# Patient Record
Sex: Male | Born: 1940
Health system: Southern US, Community
[De-identification: ages and names within clinical notes are randomized; demographics above are authoritative.]

## PROBLEM LIST (undated history)

## (undated) DIAGNOSIS — I739 Peripheral vascular disease, unspecified: Secondary | ICD-10-CM

## (undated) DIAGNOSIS — J449 Chronic obstructive pulmonary disease, unspecified: Secondary | ICD-10-CM

## (undated) DIAGNOSIS — E7800 Pure hypercholesterolemia, unspecified: Secondary | ICD-10-CM

## (undated) DIAGNOSIS — R413 Other amnesia: Secondary | ICD-10-CM

## (undated) DIAGNOSIS — I10 Essential (primary) hypertension: Secondary | ICD-10-CM

## (undated) DIAGNOSIS — K219 Gastro-esophageal reflux disease without esophagitis: Secondary | ICD-10-CM

## (undated) HISTORY — DX: Pure hypercholesterolemia, unspecified: E78.00

## (undated) HISTORY — DX: Peripheral vascular disease, unspecified: I73.9

## (undated) HISTORY — PX: COLONOSCOPY: SHX174

## (undated) HISTORY — PX: HERNIA REPAIR: SHX51

## (undated) HISTORY — DX: Essential (primary) hypertension: I10

---
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
## 2000-09-23 ENCOUNTER — Encounter: Payer: Self-pay | Admitting: Internal Medicine

## 2000-09-23 ENCOUNTER — Emergency Department (HOSPITAL_COMMUNITY): Admission: EM | Admit: 2000-09-23 | Discharge: 2000-09-23 | Payer: Self-pay | Admitting: Internal Medicine

## 2004-09-23 ENCOUNTER — Emergency Department (HOSPITAL_COMMUNITY): Admission: EM | Admit: 2004-09-23 | Discharge: 2004-09-23 | Payer: Self-pay | Admitting: Emergency Medicine

## 2006-03-16 HISTORY — PX: COLONOSCOPY W/ BIOPSIES AND POLYPECTOMY: SHX1376

## 2008-08-31 ENCOUNTER — Encounter: Admission: RE | Admit: 2008-08-31 | Discharge: 2008-08-31 | Payer: Self-pay | Admitting: Interventional Cardiology

## 2008-10-11 ENCOUNTER — Ambulatory Visit (HOSPITAL_COMMUNITY): Admission: RE | Admit: 2008-10-11 | Discharge: 2008-10-11 | Payer: Self-pay | Admitting: Interventional Cardiology

## 2008-10-22 ENCOUNTER — Ambulatory Visit: Payer: Self-pay | Admitting: Surgery

## 2009-09-23 ENCOUNTER — Ambulatory Visit: Payer: Self-pay | Admitting: Surgery

## 2010-06-22 LAB — GLUCOSE, CAPILLARY
Glucose-Capillary: 200 mg/dL — ABNORMAL HIGH (ref 70–99)
Glucose-Capillary: 224 mg/dL — ABNORMAL HIGH (ref 70–99)

## 2010-07-29 NOTE — Assessment & Plan Note (Signed)
OFFICE VISIT   JARIN, CORNFIELD  DOB:  Jan 27, 1941                                       09/23/2009  GUYQI#:[id number]   REASON FOR VISIT:  Follow-up claudication.   HISTORY:  The patient is a 70 year old gentleman that I saw in August of  last year at the request of Dr. Corky Crafts for bilateral  claudication right leg greater than the left.  The patient has been  having problems for several years but it had gotten worse.  He used to  be able to walk several miles on a treadmill and now he is walking only  approximately 0.2 miles.  He has a known left iliac occlusion, right  iliac stenosis and right superficial femoral occlusion.  Over the past  year, he has not had a significant change in his symptoms.  He still  walks about the same distance.  He does not have any wounds or ulcers  that are nonhealing.  He does not have rest pain symptoms.  The patient  continues to smoke.  He has tried Chantix and patches without success.   The patient suffers from diabetes.  Most recent hemoglobin A1c was 6.9.  He also suffers from hypercholesterolemia.  His LDL is 99.   REVIEW OF SYSTEMS:  GENERAL:  Negative  for fevers, chills, weight gain  or weight loss.  VASCULAR:  Positive for pain in the legs when walking.  CARDIAC:  Positive for shortness of breath with exertion.  GI:  Negative.  NEUROLOGIC:  Negative.  PULMONARY:  Negative.  HEME:  Negative.  GU:  Negative.  ENT:  Negative.  MUSCULOSKELETAL:  Negative.  PSYCHIATRIC:  Negative.  SKIN:  Negative.   PHYSICAL EXAMINATION:  Heart rate 70, blood pressure 149/68, O2  saturation is 98%.  Temperature is 98.2.  General:  He is well-appearing  in no distress.  HEENT:  Within normal limits.  Lungs:  Clear  bilaterally.  No rales or rhonchi.  Cardiovascular:  Regular rate and  rhythm. No murmur.  No carotid bruits.  The left femoral pulse is not  palpable.  Right is palpable.  Pedal pulses are not  palpable.  Abdomen:  Soft, nontender.  Musculoskeletal:  Without major deformities.  Neurologic:  No focal weaknesses.  Skin:  Without rash.   ASSESSMENT/PLAN:  Bilateral claudication right greater than left.   1. We discussed that the preferred operation for his disease would be      an aortobifemoral bypass graft.  After our discussion  he did not      feel that his symptoms were limiting enough to want to undergo      operation at this time.  He has been taking 50 mg of Cilostazol.  I      have recommended that we increase this to 100 mg to see if he can      get a little improvement in his walking ability.  I will plan on      seeing him back in 1 year or sooner if his symptoms worsen.  We      also discussed that if we were to proceed with aortobifemoral      bypass graft, I feel this most likely would relieve his      claudication symptoms.  However, he does have a right superficial  femoral occlusion, which may need to be addressed down the road, if      his symptoms do not completely go away on the right.   I also discussed with the patient his need to quit smoking, certainly if  we consider major vascular surgery.   Plan on seeing the patient back in 1 year.     Jorge Ny, MD  Electronically Signed   VWB/MEDQ  D:  09/23/2009  T:  09/24/2009  Job:  [id number]   cc:   Corky Crafts, MD  Dr. Oletha Cruel

## 2010-07-29 NOTE — Assessment & Plan Note (Signed)
OFFICE VISIT   KAIMEN, PEINE  DOB:  Sep 08, 1940                                       10/22/2008  ZOXWR#:[id number]   REASON FOR VISIT:  Claudication.   HISTORY:  This is a 70 year old gentleman I am seeing at the request of  Dr. Everette Rank for evaluation of bilateral claudication right greater  than left.  The patient states that he has been having problems with his  legs for approximately 2-3 years, but has gotten significantly worse  over the past 3 months.  He used to be able to walk several miles on a  treadmill and now he is walking approximately 0.2 miles, he develops  pain in his calf in his right leg, initially, followed by his left leg,  this is becoming lifestyle-limiting for him.  He has tried Pletal  without significant results.  He has undergone an arteriogram by Dr.  Eldridge Dace as well as a CT angiogram with runoff, this is consistent with  left iliac occlusion and significant right iliac disease and right  superficial femoral occlusion.  The patient comes in today to discuss  surgical options.  The patient is diabetic and has been so since age 66.  He does smoke approximately a pack of cigarettes per day.   PAST MEDICAL HISTORY:  1. Diabetes.  2. Hypercholesterolemia.  3. Tobacco abuse.   PAST SURGICAL HISTORY:  Bilateral inguinal hernias.   FAMILY HISTORY:  Negative for cardiovascular disease in early age.   SOCIAL HISTORY:  He is widowed, he smokes a pack a day, does not drink  alcohol.   REVIEW OF SYSTEMS:  GENERAL:  Is negative for fevers, chills, weight  gain, weight loss.  CARDIAC:  Negative.  PULMONARY:  Negative.  GI:  Negative.  GU:  Negative.  VASCULAR:  Is as above.  NEURO:  Negative.  ORTHO:  Negative.  PSYCH:  Negative.  ENT:  Negative.  HEME:  Negative.   MEDICATIONS:  Please see medical records.   ALLERGIES:  Sulfa.   PHYSICAL EXAMINATION:  Vital Signs:  Blood pressure 167/67, pulse 83.  General:  He is  well-appearing, no acute distress.  HEENT:  Normocephalic, atraumatic.  Pupils equal.  Sclerae anicteric.  Neck:  Supple.  No JVD.  No carotid bruits.  Cardiovascular:  Regular in rate  and rhythm, no murmur.  Pulmonary:  Lungs are clear bilaterally.  Abdomen:  Soft, nontender.  No pulsatile mass.  No hepatosplenomegaly.  Extremities:  Warm and well-perfused.  Pedal pulses are not palpable.  There is no ulceration.  Skin:  Without rash.  Neuro:  Cranial nerves II  through XII are grossly intact.  Psych:  He is alert and oriented x3.   DIAGNOSTIC STUDIES:  The patient comes with a CT angiogram and aortogram  with bilateral runoff.  These were extensively reviewed with the  patient, totaling approximately 45 minutes.   ASSESSMENT/PLAN:  Bilateral claudication.   Plan:  I had an extensive conversation with the patient and his friend  discussing our surgical options.  I told him that I would recommend an  aortobifemoral bypass graft.  I told him this would not address his  right superficial femoral occlusion, but I think with the above  operation he would likely experience significant benefit and may not  require a femoral popliteal bypass on the right.  We discussed all the  risks and benefits of surgery, the pros and cons of an operation vs  continued surveillance with an exercise program, at this point the  patient is a little reluctant to proceed with an operation and wishes to  continue with an exercise program, we discussed the details of a formal  exercise program, he wishes to implement that to see if he can avoid an  operation.  Again, he does not have a limb-threatening situation at this  time and I think this is reasonable, he will contact me in the future  when he gets to the point where he requires surgical bypass, he  understands that his disease can progress in the meantime, and that if  we wait greater than 3-6 months we may need to repeat some of his  imaging.  Also, the  patient is scheduled to get a carotid duplex to  evaluate his extracranial carotid circulation within the next week.   Jorge Ny, MD  Electronically Signed   VWB/MEDQ  D:  10/22/2008  T:  10/23/2008  Job:  [id number]   cc:   Corky Crafts, MD  Bryan Lemma. Manus Gunning, M.D.

## 2010-07-29 NOTE — Op Note (Signed)
Steven Sanders, Steven Sanders              ACCOUNT NO.:  [id number]   MEDICAL RECORD NO.:  [id number]          PATIENT TYPE:  AMB   LOCATION:  SDS                          FACILITY:  [facility]   PHYSICIAN:  Corky Crafts, MDDATE OF BIRTH:  1940-07-19   DATE OF PROCEDURE:  10/11/2008  DATE OF DISCHARGE:  10/11/2008                               OPERATIVE REPORT   REFERRING PHYSICIAN:  Bryan Lemma. Ehinger, MD   PROCEDURES PERFORMED:  Abdominal aortogram, pelvic angiogram, bilateral  lower extremity runoff.   OPERATOR:  Corky Crafts, MD   INDICATIONS:  Claudication, right leg worse than left.   PROCEDURE NARRATIVE:  The risks and benefits of peripheral vascular  angiography were explained to the patient and informed consent was  obtained.  He was brought to the [facility] lab.  He was prepped and draped in  the usual sterile fashion.  His right groin was infiltrated with 1%  lidocaine.  A 5-French sheath was placed into the right femoral artery  using the modified Seldinger technique.  Pigtail catheter was advanced  to the abdominal aorta.  A power injection of contrast was performed in  the AP projection to image the abdominal aorta.  The catheter was pulled  back to the aortoiliac bifurcation.  A power injection of contrast was  performed to image the pelvic vasculature.  A power injection of  contrast was performed in a bolus-chase fashion to image the both lower  extremities.  The pigtail catheter was then used to take selective shots  of the external iliac artery.  Subsequently, a shot through the sheath  was obtained to take a picture of the distal external iliac and right  common femoral artery.  The sheath was removed using manual compression.   FINDINGS:  There was no abdominal aortic aneurysm.  There were bilateral  single renal arteries, both of which were widely patent.  The left leg  showed the left common iliac artery was occluded proximally.  The left  internal iliac artery  was occluded.  The left external iliac artery was  occluded until just above the pelvic brim where it reconstituted.  There  were collaterals to the internal iliac coming off of the aorta.  The  left common femoral artery appeared patent.  The left profunda femoral  artery was patent.  The left SFA was patent.  The left popliteal artery  was patent.  There was three-vessel runoff below the left knee, all of  which was patent.  The right leg:  Right common iliac artery was diffusely diseased with  mild-to-moderate atherosclerosis in calcium.  The external iliac had a  tight stenosis.  Distally, the sheath was nearly occlusive.  It appeared  that the severe disease at the distal external iliac extended into the  right common femoral artery, which was quite calcified.  The right  profunda femoral artery was widely patent.  The right SFA had diseased  proximally.  It was occluded in the proximal SFA and reconstituted at  the mid SFA.  There were collaterals supplying blood flow to the distal  SFA.  The  right popliteal artery appeared patent.  The right anterior  tibial artery appeared occluded at the mid vessel.  The right  tibioperoneal trunk was patent.  The peroneal artery was small, but  patent.  The right posterior tibial artery was patent.   IMPRESSION:  1. Severe atherosclerotic disease involving the left common iliac and      left external iliac artery.  There was severe disease also      involving the right distal external iliac and common femoral      artery.  The right superficial femoral artery is occluded.  The      right anterior tibial artery is occluded.  2. There was two-vessel runoff below the right knee.  There was three-      vessel runoff below the left main.   RECOMMENDATIONS:  We will consult Vascular Surgery for their opinion as  to whether a surgical revascularization may be some benefit.      Corky Crafts, MD  Electronically Signed     JSV/MEDQ  D:   10/11/2008  T:  10/11/2008  Job:  [id number]

## 2010-08-01 NOTE — Consult Note (Signed)
[facility]  Patient:    Steven Sanders, Steven Sanders                     MRN: [id number] Proc. Date: 09/23/00 Adm. Date:  [id number] Disc. Date: [id number] Attending:  Kelvin Cellar CC:         Dyanne Carrel, M.D. (New patient)   Consultation Report  DATE OF BIRTH:  Aug 02, 1940  REASON FOR CONSULTATION:  Confusion, mild metabolic acidosis, uncontrolled diabetic with no medical care.  HISTORY OF PRESENT ILLNESS:  Steven Sanders is a 70 year old type 1 diabetic, diagnosed at age 11 when he presented initially with a coma, managed by insulin alone in the subsequent years, with no active medical care for the last 20 years, self-dosing his insulin 70/30 at a fixed dose of 65 units daily.  He has had one and possibly two prior episodes of DKA, none since about 15 years ago.  Steven Sanders was well until this morning, when he awoke confused.  He was able somehow to make it to work (the details of which he does not recall), where co-workers noted him to be acting abnormally.  He was brought to the emergency room for evaluation, where he was noted to be hyperglycemic and with mild metabolic acidosis, bicarbonate 19 venous.  Insulin and IV fluids were administered, and mental status began rapidly improving.  I was asked to evaluate him for possible admission or disposition.  Steven Sanders denies prior symptoms of illness.  He has had no cough, upper respiratory symptoms, dysuria, diarrhea, change in appetite, weight loss or weight gain.  He missed his dose of insulin yesterday secondary to a business trip to [place]; he had subsequently had no insulin for approximately 48 hours prior to his presentation here today.  REVIEW OF SYSTEMS:  Review of systems otherwise significant for burning calf pain with walking, improved with rest; also developing some significant numbness of the distal feet.  His eyesight has not been formally evaluated, and he wears glasses to  read.  PAST MEDICAL HISTORY:  Past medical history surprisingly unremarkable aside from diabetes.  MEDICATIONS:  Insulin 70/30, 65 units ______  q.d. before lunch.  SOCIAL HISTORY:  Patient is single and has very close friends at work.  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  He smokes two packs per day.  FAMILY HISTORY:   Negative for diabetes and heart disease.  PHYSICAL EXAMINATION:  VITAL SIGNS:  On presentation, temperature 97, pulse 97, blood pressure 143/62, respiratory rate 18.  GENERAL:  He is a slender man with ruddy complexion.  By the time I evaluated him, his mental status was completely clear and lucid.  NECK:  The carotids are without bruits.  LUNGS:  The lungs are surprisingly clear.  HEART:  Regular rate and rhythm with no significant murmur, rub or gallop.  ABDOMEN:  Soft and nontender with no abdominal bruit.  He has increased abdominal wall tone.  EXTREMITIES:  Lower extremities are notable for significantly reduced pulses at the popliteal and dorsalis pedis, posterior tibial.  Capillary refill at the toes is 3 seconds.  The feet are warm.  He has reduced sensation primarily over the left foot but also of the right toes.  There is no lower extremity edema.  He has a normal distal hair pattern.  LABORATORY DATA:  Sodium 134, potassium 4.5, bicarb 19, creatinine 1.1.  Serum glucose 373.  Blood positive for ketones as is urinalysis.  Serum osmolality is normal.  WBC elevated at 15 with normal hemoglobin.  EKG:  Normal sinus rhythm, pulse 90, with poor R wave progression and presence of left atrial enlargement.  ASSESSMENT AND PLAN:  Steven Sanders has early diabetic ketoacidosis secondary to insulin insufficiency.  We administered 2.5 L normal saline and rechecked BMET, with improvement in bicarbonate to 22, improvement in creatinine to 0.9 and increase in sodium to 138.  His mental status is normal, as confirmed by close friends present  today.  He is stable for discharge home.  We had a long talk about his reticence for physician involvement in his medical care.  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  He feels that he is developing nocturnal hypoglycemia based on symptoms.  He eats only two meals a day, at lunch and supper, and administers 70/30 before lunch without a bedtime snack.  We discussed the probability that his hemoglobin A1c may be very high, and in combination with heavy tobacco use, he is at markedly increased risk for cardiovascular disease, peripheral vascular disease, nephropathy and retinopathy.  He acknowledges this, and after reinforcement by friends, agrees to follow up with a physician and take a more active role in his medical care.  I have arranged a new patient evaluation with Dr. Delories Heinz Ehinger in five days, and have scheduled an appointment at [facility].  He will obtain a glucometer tomorrow and will start monitoring blood sugars q.a.c./h.s.  Initially, he will stay on a 70/30 but will add a bedtime snack to avoid nocturnal hypoglycemia, then we can establish a pattern and adjustments can be made.  I discussed with him the possibilities of other insulin types such as Lantus and Humalog.  He is interested in a once-daily injection if he could be regulated.  I fear he is also developing atherosclerotic peripheral vascular disease, based on his symptoms of claudication and his reduced pulses on physical exam. He will need evaluation for this, as well as routine ophthalmologic evaluation.  DD:  09/23/00 TD:  09/24/00 Job: [id number]

## 2010-09-22 ENCOUNTER — Encounter (INDEPENDENT_AMBULATORY_CARE_PROVIDER_SITE_OTHER): Payer: BC Managed Care – PPO

## 2010-09-22 DIAGNOSIS — I7092 Chronic total occlusion of artery of the extremities: Secondary | ICD-10-CM

## 2011-08-31 ENCOUNTER — Encounter: Payer: Self-pay | Admitting: Surgery

## 2011-09-28 ENCOUNTER — Ambulatory Visit: Payer: BC Managed Care – PPO | Admitting: Surgery

## 2011-10-02 ENCOUNTER — Encounter: Payer: Self-pay | Admitting: Neurosurgery

## 2011-10-05 ENCOUNTER — Ambulatory Visit: Payer: BC Managed Care – PPO | Admitting: Neurosurgery

## 2011-10-05 ENCOUNTER — Ambulatory Visit (INDEPENDENT_AMBULATORY_CARE_PROVIDER_SITE_OTHER): Payer: BC Managed Care – PPO | Admitting: Neurosurgery

## 2011-10-05 ENCOUNTER — Ambulatory Visit (INDEPENDENT_AMBULATORY_CARE_PROVIDER_SITE_OTHER): Payer: BC Managed Care – PPO | Admitting: *Deleted

## 2011-10-05 ENCOUNTER — Encounter: Payer: Self-pay | Admitting: Neurosurgery

## 2011-10-05 VITALS — BP 135/67 | HR 59 | Resp 16 | Ht 66.0 in | Wt 150.8 lb

## 2011-10-05 DIAGNOSIS — I70219 Atherosclerosis of native arteries of extremities with intermittent claudication, unspecified extremity: Secondary | ICD-10-CM

## 2011-10-05 NOTE — Progress Notes (Signed)
[facility] Office Note  CC: One-year followup with ABIs to claudication Referring Physician: Myra Gianotti  History of Present Illness: 71 year old male patient of Dr. Myra Gianotti followed for known bilateral claudication. Patient was last seen by Dr. Myra Gianotti July 2011 since that time his ABIs and remained stable but very low. The patient reports his lateral leg pain is unchanged but he has decided to proceed with surgery in early 2014. The patient denies rest pain or open ulcerations.  Past Medical History  Diagnosis Date  . Hypercholesterolemia   . Diabetes mellitus   . Peripheral vascular disease   . Hypertension     ROS: [x]  Positive   [ ]  Denies    General: [ ]  Weight loss, [ ]  Fever, [ ]  chills Neurologic: [ ]  Dizziness, [ ]  Blackouts, [ ]  Seizure [ ]  Stroke, [ ]  "Mini stroke", [ ]  Slurred speech, [ ]  Temporary blindness; [ ]  weakness in arms or legs, [ ]  Hoarseness Cardiac: [ ]  Chest pain/pressure, [ ]  Shortness of breath at rest [x ] Shortness of breath with exertion, [ ]  Atrial fibrillation or irregular heartbeat Vascular: [ x] Pain in legs with walking, [ ]  Pain in legs at rest, [ ]  Pain in legs at night,  [ ]  Non-healing ulcer, [ ]  Blood clot in vein/DVT,   Pulmonary: [ ]  Home oxygen, [ ]  Productive cough, [ ]  Coughing up blood, [ ]  Asthma,  [ ]  Wheezing Musculoskeletal:  [ ]  Arthritis, [ ]  Low back pain, [ ]  Joint pain Hematologic: [ ]  Easy Bruising, [ ]  Anemia; [ ]  Hepatitis Gastrointestinal: [ ]  Blood in stool, [ ]  Gastroesophageal Reflux/heartburn, [ ]  Trouble swallowing Urinary: [ ]  chronic Kidney disease, [ ]  on HD - [ ]  MWF or [ ]  TTHS, [ ]  Burning with urination, [ ]  Difficulty urinating Skin: [ ]  Rashes, [ ]  Wounds Psychological: [ ]  Anxiety, [ ]  Depression   Social History History  Substance Use Topics  . Smoking status: Former Smoker -- 1.0 packs/day    Types: Cigarettes    Quit date: 01/14/2009  . Smokeless tobacco: Not on  file  . Alcohol Use: Yes    Family History Family History  Problem Relation Age of Onset  . Hyperlipidemia Mother   . Hyperlipidemia Father     Allergies  Allergen Reactions  . Neomycin   . Tea Tree Oil   . Sulfa Antibiotics     Current Outpatient Prescriptions  Medication Sig Dispense Refill  . aspirin 81 MG tablet Take 81 mg by mouth daily.      . cilostazol (PLETAL) 50 MG tablet Take 50 mg by mouth.      . fish oil-omega-3 fatty acids 1000 MG capsule Take 2 g by mouth daily.      Marland Kitchen lisinopril (PRINIVIL,ZESTRIL) 5 MG tablet Take 5 mg by mouth daily.      Marland Kitchen NOVOLIN 70/30 (70-30) 100 UNIT/ML injection Inject 100 Units into the skin daily.      Marland Kitchen omeprazole (PRILOSEC) 20 MG capsule Take 20 mg by mouth every other day.      . rosuvastatin (CRESTOR) 40 MG tablet Take 40 mg by mouth daily.        Physical Examination  Filed Vitals:   10/05/11 1615  BP: 135/67  Pulse: 59  Resp: 16    Body mass index is 24.34 kg/(m^2).  General:  WDWN in NAD Gait: Normal HEENT: WNL Eyes: Pupils equal Pulmonary: normal non-labored breathing ,  without Rales, rhonchi,  wheezing Cardiac: RRR, without  Murmurs, rubs or gallops; No carotid bruits Abdomen: soft, NT, no masses Skin: no rashes, ulcers noted Vascular Exam/Pulses: Lower extremity pulses are not palpable  Extremities without ischemic changes, no Gangrene , no cellulitis; no open wounds;  Musculoskeletal: no muscle wasting or atrophy  Neurologic: A&O X 3; Appropriate Affect ; SENSATION: normal; MOTOR FUNCTION:  moving all extremities equally. Speech is fluent/normal  Non-Invasive Vascular Imaging: ABIs today are 0.34 on the right, 0.44 on the left and monophasic, previous ABIs from July 2012 were 0.37 on the right, 0.46 on the left  ASSESSMENT/PLAN: Patient with significant claudication with ambulation, he states he will proceed with speaking with Dr. Myra Gianotti about surgery but does not want this done until January or February of  2014. The patient will followup in December 2013 with another ABI and discuss surgery with Dr. Myra Gianotti. The patient's questions were encouraged and answered.  Lauree Chandler ANP  Clinic M.D.: Myra Gianotti

## 2011-11-19 ENCOUNTER — Emergency Department (HOSPITAL_COMMUNITY): Payer: BC Managed Care – PPO

## 2011-11-19 ENCOUNTER — Emergency Department (HOSPITAL_COMMUNITY)
Admission: EM | Admit: 2011-11-19 | Discharge: 2011-11-19 | Disposition: A | Discharge status: Home / Self Care | Payer: BC Managed Care – PPO | Attending: Emergency Medicine | Admitting: Emergency Medicine

## 2011-11-19 ENCOUNTER — Encounter (HOSPITAL_COMMUNITY): Payer: Self-pay | Admitting: Emergency Medicine

## 2011-11-19 DIAGNOSIS — W108XXA Fall (on) (from) other stairs and steps, initial encounter: Secondary | ICD-10-CM | POA: Insufficient documentation

## 2011-11-19 DIAGNOSIS — M545 Low back pain, unspecified: Secondary | ICD-10-CM | POA: Insufficient documentation

## 2011-11-19 DIAGNOSIS — E7800 Pure hypercholesterolemia, unspecified: Secondary | ICD-10-CM | POA: Insufficient documentation

## 2011-11-19 DIAGNOSIS — M533 Sacrococcygeal disorders, not elsewhere classified: Secondary | ICD-10-CM | POA: Insufficient documentation

## 2011-11-19 DIAGNOSIS — I739 Peripheral vascular disease, unspecified: Secondary | ICD-10-CM | POA: Insufficient documentation

## 2011-11-19 DIAGNOSIS — Z87891 Personal history of nicotine dependence: Secondary | ICD-10-CM | POA: Insufficient documentation

## 2011-11-19 DIAGNOSIS — Z882 Allergy status to sulfonamides status: Secondary | ICD-10-CM | POA: Insufficient documentation

## 2011-11-19 DIAGNOSIS — E119 Type 2 diabetes mellitus without complications: Secondary | ICD-10-CM | POA: Insufficient documentation

## 2011-11-19 DIAGNOSIS — M549 Dorsalgia, unspecified: Secondary | ICD-10-CM

## 2011-11-19 DIAGNOSIS — R079 Chest pain, unspecified: Secondary | ICD-10-CM | POA: Insufficient documentation

## 2011-11-19 DIAGNOSIS — I10 Essential (primary) hypertension: Secondary | ICD-10-CM | POA: Insufficient documentation

## 2011-11-19 DIAGNOSIS — W19XXXA Unspecified fall, initial encounter: Secondary | ICD-10-CM

## 2011-11-19 MED ORDER — HYDROCODONE-ACETAMINOPHEN 5-325 MG PO TABS
1.0000 | ORAL_TABLET | Freq: Once | ORAL | Status: AC
  Administered 2011-11-19: 1 via ORAL
  Filled 2011-11-19: qty 1

## 2011-11-19 MED ORDER — IBUPROFEN 600 MG PO TABS: 600.0000 mg | ORAL_TABLET | Freq: Four times a day (QID) | ORAL | Status: AC | PRN

## 2011-11-19 MED ORDER — HYDROCODONE-ACETAMINOPHEN 5-325 MG PO TABS: 1.0000 | ORAL_TABLET | Freq: Four times a day (QID) | ORAL | Status: DC | PRN

## 2011-11-19 MED ORDER — IBUPROFEN 600 MG PO TABS: 600.0000 mg | ORAL_TABLET | Freq: Four times a day (QID) | ORAL | Status: DC | PRN

## 2011-11-19 MED ORDER — OXYCODONE-ACETAMINOPHEN 5-325 MG PO TABS: 1.0000 | ORAL_TABLET | Freq: Four times a day (QID) | ORAL | Status: AC | PRN

## 2011-11-19 NOTE — ED Notes (Signed)
Not in room, pt in xray. 

## 2011-11-19 NOTE — ED Provider Notes (Addendum)
History     CSN: [id number]  Arrival date & time 11/19/11  0128   First MD Initiated Contact with Patient 11/19/11 [phone]      Chief Complaint  Patient presents with  . Back Pain    (Consider location/radiation/quality/duration/timing/severity/associated sxs/prior treatment) Patient is a 71 y.o. male presenting with back pain. The history is provided by the patient.  Back Pain  This is a new problem. Episode onset: 3 days ago  The problem occurs constantly. The problem has been gradually worsening. The pain is associated with falling (mechanical miss step down stairs. Denies hitting  head, LOC, or being on blood thinners). Pain location: Coccyx, lumbar & right lower rib. The quality of the pain is described as aching. The pain does not radiate. The pain is at a severity of 7/10. The pain is moderate. The symptoms are aggravated by bending, certain positions and twisting. The pain is the same all the time. Pertinent negatives include no chest pain, no fever, no numbness, no weight loss, no headaches, no abdominal pain, no abdominal swelling, no bowel incontinence, no perianal numbness, no bladder incontinence, no dysuria, no pelvic pain, no leg pain, no paresthesias, no paresis, no tingling and no weakness. He has tried NSAIDs for the symptoms. The treatment provided mild relief.    Past Medical History  Diagnosis Date  . Hypercholesterolemia   . Diabetes mellitus   . Peripheral vascular disease   . Hypertension     Past Surgical History  Procedure Date  . Hernia repair     Family History  Problem Relation Age of Onset  . Hyperlipidemia Mother   . Hyperlipidemia Father     History  Substance Use Topics  . Smoking status: Former Smoker -- 1.0 packs/day    Types: Cigarettes    Quit date: 01/14/2009  . Smokeless tobacco: Not on file  . Alcohol Use: Yes      Review of Systems  Constitutional: Negative for fever and weight loss.  Cardiovascular: Negative for chest pain.    Gastrointestinal: Negative for abdominal pain and bowel incontinence.  Genitourinary: Negative for bladder incontinence, dysuria and pelvic pain.  Musculoskeletal: Positive for back pain.  Neurological: Negative for tingling, weakness, numbness, headaches and paresthesias.    Allergies  Neomycin; Tea tree oil; and Sulfa antibiotics  Home Medications   Current Outpatient Rx  Name Route Sig Dispense Refill  . ASPIRIN 81 MG PO TABS Oral Take 81 mg by mouth daily.    Marland Kitchen CILOSTAZOL 50 MG PO TABS Oral Take 50 mg by mouth.    Marland Kitchen LISINOPRIL 5 MG PO TABS Oral Take 5 mg by mouth daily.    Marland Kitchen NAPROXEN SODIUM 220 MG PO TABS Oral Take 220 mg by mouth 2 (two) times daily as needed. For back pain    . NOVOLIN 70/30 (70-30) 100 UNIT/ML [place] SUSP Subcutaneous Inject 65 Units into the skin daily.     Marland Kitchen OMEPRAZOLE 20 MG PO CPDR Oral Take 20 mg by mouth every other day.    Marland Kitchen OVER THE COUNTER MEDICATION Oral Take 1 tablet by mouth daily. Allergy medication    . ROSUVASTATIN CALCIUM 40 MG PO TABS Oral Take 40 mg by mouth daily.      BP 175/66  Pulse 79  Temp 97.5 F (36.4 C) (Oral)  Resp 16  SpO2 94%  Physical Exam  Nursing note and vitals reviewed. Constitutional: He is oriented to person, place, and time. He appears well-developed and well-nourished. No distress.  HENT:  Head: Normocephalic and atraumatic.  Eyes: Conjunctivae normal and EOM are normal. Pupils are equal, round, and reactive to light. No scleral icterus.  Neck: Normal range of motion and full passive range of motion without pain. Neck supple. No spinous process tenderness and no muscular tenderness present. No rigidity. Normal range of motion present. No Brudzinski's sign noted.  Cardiovascular: Normal rate, regular rhythm and intact distal pulses.  Exam reveals no gallop and no friction rub.   No murmur heard. Pulmonary/Chest: Effort normal and breath sounds normal. No respiratory distress. He has no wheezes. He has no rales. He  exhibits tenderness (bilateral lower ribs ).  Musculoskeletal:       Cervical back: He exhibits normal range of motion, no tenderness, no bony tenderness and no pain.       Thoracic back: He exhibits no tenderness, no bony tenderness and no pain.       Lumbar back: He exhibits tenderness, bony tenderness and pain. He exhibits no spasm and normal pulse.       Right foot: He exhibits no swelling.       Left foot: He exhibits no swelling.       Bony ttp over lumbar and coccyx. Bilateral lower extremities nontender without color change, baseline range of motion of extremities with intact distal pulses, capillary refill less than 2 seconds bilaterally.  Pt has increased pain w ROM of lumbar spine. Pain w ambulation, no sign of ataxia.  Neurological: He is alert and oriented to person, place, and time. He has normal strength and normal reflexes. No sensory deficit.       Sensation at baseline for light touch in all 4 distal extremities, motor symmetric & bilateral 5/5 (hips: abduction, adduction, flexion; knee: flexion & extension; foot: dorsiflexion, plantar flexion, toes: dorsi flexion) nl patella reflex  Skin: Skin is warm and dry. No rash noted. He is not diaphoretic. No erythema. No pallor.  Psychiatric: He has a normal mood and affect.    ED Course  Procedures (including critical care time)  Labs Reviewed - No data to display Dg Chest 2 View  11/19/2011  *RADIOLOGY REPORT*  Clinical Data: Lower right rib pain, fell down steps 3 days ago  CHEST - 2 VIEW  Comparison: Chest radiograph 09/23/2004  Findings: Normal heart size, mediastinal contours, and pulmonary vascularity. Emphysematous and bronchitic changes consistent with COPD. No infiltrate, pleural effusion, or pneumothorax. Question old lateral left rib fractures though these were not present on the previous study from 2006. No right rib fractures are evident.  IMPRESSION: Emphysematous bronchitic changes question COPD. No acute right rib  abnormalities identified. Question interval left rib fractures, appear old.   Original Report Authenticated By: Lollie Marrow, M.D.    Dg Lumbar Spine Complete  11/19/2011  *RADIOLOGY REPORT*  Clinical Data: Larey Seat down multiple steps 3 days ago, right side back pain  LUMBAR SPINE - COMPLETE 4+ VIEW  Comparison: None  Findings: Five non-rib bearing lumbar vertebrae. Osseous demineralization. Vertebral body and disc space heights maintained. No acute fracture, subluxation or bone destruction. No spondylolysis. Atherosclerotic calcifications aorta. SI joints symmetric. Minimal broad-based levoconvex scoliosis demonstrated.  IMPRESSION: No acute lumbar spine abnormalities.   Original Report Authenticated By: Lollie Marrow, M.D.    Dg Sacrum/coccyx  11/19/2011  *RADIOLOGY REPORT*  Clinical Data: Right low back pain, fell down steps 3 days ago  SACRUM AND COCCYX - 2+ VIEW  Comparison: None  Findings: Osseous demineralization. Symmetric SI joints and sacral  foramina. No sacrococcygeal fracture identified.  IMPRESSION: No acute sacrococcygeal abnormalities.   Original Report Authenticated By: Lollie Marrow, M.D.      No diagnosis found.    MDM  Fall, mechanical Back/rib pain  Patient with back pain.  No neurological deficits and normal neuro exam.  Patient ambulates in ER without difficulty. Images reviewed with out acute abnormalities. No loss of bowel or bladder control.  No concern for cauda equina.  No fever, night sweats, weight loss, h/o cancer, IVDU.  RICE protocol and pain medicine indicated and discussed with patient.          Jaci Carrel, PA-C 11/19/11 0455  Jaci Carrel, PA-C 12/31/11 [phone]

## 2011-11-19 NOTE — ED Notes (Signed)
PT. SLIPPED WHILE GOING DOWN STEPS 3 DAYS AGO , REPORTS PAIN AT RIGHT LOWER BACK , NO LOC . AMBULATORY.

## 2011-11-19 NOTE — ED Provider Notes (Signed)
History/physical exam/procedure(s) were performed by non-physician practitioner and as supervising physician I was immediately available for consultation/collaboration. I have reviewed all notes and am in agreement with care and plan.   Hilario Quarry, MD 11/19/11 [phone]

## 2012-01-15 NOTE — ED Provider Notes (Signed)
History/physical exam/procedure(s) were performed by non-physician practitioner and as supervising physician I was immediately available for consultation/collaboration. I have reviewed all notes and am in agreement with care and plan.   Hilario Quarry, MD 01/15/12 0005

## 2012-02-19 ENCOUNTER — Other Ambulatory Visit: Payer: Self-pay | Admitting: *Deleted

## 2012-02-19 ENCOUNTER — Encounter: Payer: Self-pay | Admitting: Surgery

## 2012-02-19 DIAGNOSIS — I70219 Atherosclerosis of native arteries of extremities with intermittent claudication, unspecified extremity: Secondary | ICD-10-CM

## 2012-02-22 ENCOUNTER — Ambulatory Visit: Payer: BC Managed Care – PPO | Admitting: Surgery

## 2012-11-18 ENCOUNTER — Encounter: Payer: Self-pay | Admitting: Surgery

## 2012-11-21 ENCOUNTER — Ambulatory Visit (INDEPENDENT_AMBULATORY_CARE_PROVIDER_SITE_OTHER): Payer: Medicare HMO | Admitting: Surgery

## 2012-11-21 ENCOUNTER — Encounter: Payer: Self-pay | Admitting: Surgery

## 2012-11-21 ENCOUNTER — Encounter (INDEPENDENT_AMBULATORY_CARE_PROVIDER_SITE_OTHER): Payer: Medicare HMO | Admitting: *Deleted

## 2012-11-21 VITALS — BP 140/64 | HR 86 | Ht 66.0 in | Wt 149.3 lb

## 2012-11-21 DIAGNOSIS — I70219 Atherosclerosis of native arteries of extremities with intermittent claudication, unspecified extremity: Secondary | ICD-10-CM

## 2012-11-21 DIAGNOSIS — I739 Peripheral vascular disease, unspecified: Secondary | ICD-10-CM

## 2012-11-21 NOTE — Progress Notes (Signed)
[facility] [place]   Patient name: Steven Sanders MRN: [id number] DOB: 1940-12-04 Sex: male     Chief Complaint  Patient presents with  . Re-evaluation    pt c/o claudication that has not got better since last OV     HISTORY OF PRESENT ILLNESS: The patient returns today for evaluation of bilateral claudication. I initially met him in 2010 for bilateral claudication right greater than left. He has had a arteriogram and a CT angiogram which shows left iliac occlusion and significant right iliac disease with right superficial femoral artery occlusion. The patient has been managed medically. He has not required any intervention at this time. He states that his symptoms have been very stable. He can walk approximately 0.15 miles on the treadmill before he gets cramping. He has tried Pletal in the past. He denies ulcers or rest pain. The patient continues to be medically managed for his hypercholesterolemia with a statin, his diabetes and hypertension  Past Medical History  Diagnosis Date  . Hypercholesterolemia   . Diabetes mellitus   . Peripheral vascular disease   . Hypertension     Past Surgical History  Procedure Laterality Date  . Hernia repair      History   Social History  . Marital Status: Widowed    Spouse Name: N/A    Number of Children: N/A  . Years of Education: N/A   Occupational History  . Not on file.   Social History Main Topics  . Smoking status: Former Smoker -- 1.00 packs/day    Types: Cigarettes    Quit date: 01/14/2009  . Smokeless tobacco: Never Used  . Alcohol Use: 5.4 oz/week    3 Glasses of wine, 3 Cans of beer, 3 Shots of liquor per week  . Drug Use: No  . Sexual Activity: Not on file   Other Topics Concern  . Not on file   Social History Narrative  . No narrative on file    Family History  Problem Relation Age of Onset  . Hyperlipidemia Mother   . Hyperlipidemia Father   . Cancer Father     Allergies as of  11/21/2012 - Review Complete 11/21/2012  Allergen Reaction Noted  . Neomycin Other (See Comments) 08/31/2011  . Tea tree oil Other (See Comments) 08/31/2011  . Sulfa antibiotics Other (See Comments) 08/31/2011    Current Outpatient Prescriptions on File Prior to Visit  Medication Sig Dispense Refill  . aspirin 81 MG tablet Take 81 mg by mouth daily.      . cilostazol (PLETAL) 50 MG tablet Take 50 mg by mouth 2 (two) times daily.       Marland Kitchen lisinopril (PRINIVIL,ZESTRIL) 5 MG tablet Take 5 mg by mouth daily.      Marland Kitchen NOVOLIN 70/30 (70-30) 100 UNIT/ML injection Inject 60 Units into the skin daily.       . naproxen sodium (ANAPROX) 220 MG tablet Take 220 mg by mouth 2 (two) times daily as needed. For back pain      . omeprazole (PRILOSEC) 20 MG capsule Take 20 mg by mouth every other day.      Marland Kitchen OVER THE COUNTER MEDICATION Take 1 tablet by mouth daily. Allergy medication      . rosuvastatin (CRESTOR) 40 MG tablet Take 40 mg by mouth daily.       No current facility-administered medications on file prior to visit.     REVIEW OF SYSTEMS: Cardiovascular: No chest pain, chest pressure, palpitations,  orthopnea, or dyspnea on exertion. No claudication or rest pain,  No history of DVT or phlebitis. Pulmonary: No productive cough, asthma or wheezing. Neurologic: No weakness, paresthesias, aphasia, or amaurosis. No dizziness. Hematologic: No bleeding problems or clotting disorders. Musculoskeletal: No joint pain or joint swelling. Gastrointestinal: No blood in stool or hematemesis Genitourinary: No dysuria or hematuria. Psychiatric:: No history of major depression. Integumentary: No rashes or ulcers. Constitutional: No fever or chills.  PHYSICAL EXAMINATION:   Vital signs are BP 140/64  Pulse 86  Ht 5\' 6"  (1.676 m)  Wt 149 lb 4.8 oz (67.722 kg)  BMI 24.11 kg/m2  SpO2 98% General: The patient appears their stated age. HEENT:  No gross abnormalities Pulmonary:  Non labored breathing Abdomen:  Soft and non-tender Musculoskeletal: There are no major deformities. Neurologic: No focal weakness or paresthesias are detected, Skin: There are no ulcer or rashes noted. Psychiatric: The patient has normal affect. Cardiovascular: There is a regular rate and rhythm without significant murmur appreciated.   Diagnostic Studies ABIs were ordered and reviewed today. He showed no significant change on the right the ABI is 0.3. On the left is 0.36.  Assessment: Bilateral claudication, right greater than left Plan: The patient's symptoms have been very stable over the course of the past few years. There are no absolute indications to proceed with revascularization at this time. I discussed careful inspection of his feet on daily basis, and if he gets a wound that does not appear to be healing to contact me immediately. In addition, if his symptoms progress we could consider revascularization, however based on a conversation today I do not feel that his symptoms her lifestyle limiting. I will have her followup with me in one year with repeat ABIs.  Jorge Ny, M.D. [facility] [place] Office: [phone] Pager:  [phone]

## 2012-12-07 ENCOUNTER — Telehealth: Payer: Self-pay

## 2012-12-07 NOTE — Telephone Encounter (Signed)
Phone call from pt. with c/o open sore in area of left medial ankle.  Reports this started last Thursday.  Noted that there is a redness surrounding the ulcer.  Describes ulcer as approx. 3/4 " in diameter, open and moist; denies drainage. Has been cleaning with warm H2O and applying Bacitracin ointment and bandaid daily.  Stated it isn't getting worse, but not healing or improving.  Advised pt. will notify Dr. Myra Gianotti for further recommendations.

## 2012-12-08 ENCOUNTER — Other Ambulatory Visit: Payer: Self-pay | Admitting: *Deleted

## 2012-12-08 DIAGNOSIS — I739 Peripheral vascular disease, unspecified: Secondary | ICD-10-CM

## 2012-12-09 ENCOUNTER — Telehealth: Payer: Self-pay | Admitting: Surgery

## 2012-12-09 ENCOUNTER — Encounter: Payer: Self-pay | Admitting: Surgery

## 2012-12-09 NOTE — Telephone Encounter (Signed)
Rec'd recommendation from Dr. Myra Gianotti to schedule appt. on 9/29 for further evaluation of left LE; appt. given for 9:30 AM.  Agrees w/ plan.

## 2012-12-09 NOTE — Telephone Encounter (Signed)
Message copied by Fredrich Birks on Fri Dec 09, 2012  9:23 AM ------      Message from: Phillips Odor      Created: Fri Dec 09, 2012  9:11 AM      Regarding: FW: recommendations       Please add to Dr. Estanislado Spire schedule on 9/29 at 9:30 AM.  Pt knows about appt.              ----- Message -----         From: Nada Libman, MD         Sent: 12/08/2012   9:09 PM           To: Conley Simmonds Pullins, RN      Subject: RE: recommendations                                      Just have patient come on MOnday      ----- Message -----         From: Erenest Blank, RN         Sent: 12/07/2012   5:04 PM           To: Nada Libman, MD      Subject: recommendations                                          Should he come to office on Monday to be evaluated?  Per my triage note:             Phone call from pt. with c/o open sore in area of left medial ankle.  Reports this started last Thursday.  Noted that there is a redness surrounding the ulcer.  Describes ulcer as approx. 3/4 " in diameter, open and moist; denies drainage.      Has been cleaning with warm H2O and applying Bacitracin ointment and bandaid daily.  Stated it isn't getting worse, but not healing or improving.  Advised pt. will notify Dr. Myra Gianotti for further recommendations             ------

## 2012-12-12 ENCOUNTER — Ambulatory Visit (INDEPENDENT_AMBULATORY_CARE_PROVIDER_SITE_OTHER): Payer: Medicare HMO | Admitting: Surgery

## 2012-12-12 ENCOUNTER — Encounter: Payer: Self-pay | Admitting: Surgery

## 2012-12-12 VITALS — BP 163/76 | HR 65 | Resp 18 | Ht 66.0 in | Wt 150.0 lb

## 2012-12-12 DIAGNOSIS — I739 Peripheral vascular disease, unspecified: Secondary | ICD-10-CM

## 2012-12-12 NOTE — Progress Notes (Signed)
[facility] [place] [facility]   Patient name: Steven Sanders MRN: [id number] DOB: 11-Oct-1940 Sex: male     Chief Complaint  Patient presents with  . New Evaluation    evaluate wound on left ankle    HISTORY OF PRESENT ILLNESS: I initially met the patient in 2010 for bilateral claudication right greater than left. He has had a arteriogram and a CT angiogram which shows left iliac occlusion and significant right iliac disease with right superficial femoral artery occlusion. The patient has been managed medically. He has not required any intervention at this time. He reports approximately a two-week history of ulcer on the left medial malleolus. He wanted to have the evaluated. He is placing bacitracin over top of the wound.   Past Medical History  Diagnosis Date  . Hypercholesterolemia   . Diabetes mellitus   . Peripheral vascular disease   . Hypertension     Past Surgical History  Procedure Laterality Date  . Hernia repair      History   Social History  . Marital Status: Widowed    Spouse Name: N/A    Number of Children: N/A  . Years of Education: N/A   Occupational History  . Not on file.   Social History Main Topics  . Smoking status: Former Smoker -- 1.00 packs/day    Types: Cigarettes    Quit date: 01/14/2009  . Smokeless tobacco: Never Used  . Alcohol Use: 5.4 oz/week    3 Glasses of wine, 3 Cans of beer, 3 Shots of liquor per week  . Drug Use: No  . Sexual Activity: Not on file   Other Topics Concern  . Not on file   Social History Narrative  . No narrative on file    Family History  Problem Relation Age of Onset  . Hyperlipidemia Mother   . Cancer Mother   . Hyperlipidemia Father   . Cancer Father     Allergies as of 12/12/2012 - Review Complete 12/12/2012  Allergen Reaction Noted  . Neomycin Other (See Comments) 08/31/2011  . Tea tree oil Other (See Comments) 08/31/2011  . Sulfa antibiotics Other (See Comments) 08/31/2011     Current Outpatient Prescriptions on File Prior to Visit  Medication Sig Dispense Refill  . aspirin 81 MG tablet Take 81 mg by mouth daily.      . cilostazol (PLETAL) 50 MG tablet Take 50 mg by mouth 2 (two) times daily.       Marland Kitchen lisinopril (PRINIVIL,ZESTRIL) 5 MG tablet Take 5 mg by mouth daily.      . naproxen sodium (ANAPROX) 220 MG tablet Take 220 mg by mouth 2 (two) times daily as needed. For back pain      . NOVOLIN 70/30 (70-30) 100 UNIT/ML injection Inject 60 Units into the skin daily.       Marland Kitchen omeprazole (PRILOSEC) 20 MG capsule Take 20 mg by mouth every other day.      Marland Kitchen OVER THE COUNTER MEDICATION Take 1 tablet by mouth daily. Allergy medication      . pravastatin (PRAVACHOL) 20 MG tablet Take 1 tablet by mouth daily.      . rosuvastatin (CRESTOR) 40 MG tablet Take 40 mg by mouth daily.       No current facility-administered medications on file prior to visit.     REVIEW OF SYSTEMS: No changes from prior visit on 11/21/2012, except what is mentioned in the history of present illness  PHYSICAL EXAMINATION:  Vital signs are BP 163/76  Pulse 65  Resp 18  Ht 5\' 6"  (1.676 m)  Wt 150 lb (68.04 kg)  BMI 24.22 kg/m2 General: The patient appears their stated age. HEENT:  No gross abnormalities Pulmonary:  Non labored breathing Skin: 2 mm open area on the left medial malleolus with epithelialization. No surrounding erythema. Psychiatric: The patient has normal affect. Cardiovascular: There is a regular rate and rhythm without significant murmur appreciated.   Diagnostic Studies None  Assessment: Left leg ulcer Plan: The patient has a small left medial malleolus ulcer x2 weeks. He is applying bacitracin dressing changes daily. I do not see any evidence of infection. In fact there does appear to be healing quality is associated with this wound. I have elected to continue to manage this with wound care. The patient is going to followup with me on October 27. He is away for  several weeks. He will be in touch with me if the wound appears to be increasing in size. He will need additional imaging should he require revascularization which most likely would be an aortobifemoral bypass  V. Charlena Cross, M.D. [facility] [place] Office: [phone] Pager:  [phone]

## 2013-01-06 ENCOUNTER — Encounter: Payer: Self-pay | Admitting: Surgery

## 2013-01-09 ENCOUNTER — Ambulatory Visit (INDEPENDENT_AMBULATORY_CARE_PROVIDER_SITE_OTHER): Payer: Medicare HMO | Admitting: Surgery

## 2013-01-09 ENCOUNTER — Encounter: Payer: Self-pay | Admitting: Surgery

## 2013-01-09 VITALS — BP 180/65 | HR 79 | Resp 18 | Ht 66.0 in | Wt 154.7 lb

## 2013-01-09 DIAGNOSIS — I739 Peripheral vascular disease, unspecified: Secondary | ICD-10-CM

## 2013-01-09 NOTE — Progress Notes (Signed)
[facility] [place]   Patient name: Steven Sanders MRN: [id number] DOB: 1941-01-19 Sex: male     Chief Complaint  Patient presents with  . PVD    left ankle ulcer scabbed over    HISTORY OF PRESENT ILLNESS: The patient comes back today for followup.  He developed a wound over his left medial malleolus approximately 6 weeks ago.  He has been treating this with topical ointments.  When I saw him one month ago it appeared as if it were healing.  He comes back in today for repeat evaluation.  I initially met the patient in 2010 for bilateral claudication, right greater than left.  He came with an arteriogram as well as a CT angiogram which revealed left iliac occlusion and significant right iliac disease with right superficial femoral artery occlusion.  The patient has been managed medically.  He continues to have stable claudication symptoms.  This ulcer however was concerning to him and therefore he contacted me immediately.  Past Medical History  Diagnosis Date  . Hypercholesterolemia   . Diabetes mellitus   . Peripheral vascular disease   . Hypertension     Past Surgical History  Procedure Laterality Date  . Hernia repair      History   Social History  . Marital Status: Widowed    Spouse Name: N/A    Number of Children: N/A  . Years of Education: N/A   Occupational History  . Not on file.   Social History Main Topics  . Smoking status: Former Smoker -- 1.00 packs/day    Types: Cigarettes    Quit date: 01/14/2009  . Smokeless tobacco: Never Used  . Alcohol Use: 5.4 oz/week    3 Glasses of wine, 3 Cans of beer, 3 Shots of liquor per week  . Drug Use: No  . Sexual Activity: Not on file   Other Topics Concern  . Not on file   Social History Narrative  . No narrative on file    Family History  Problem Relation Age of Onset  . Hyperlipidemia Mother   . Cancer Mother   . Hyperlipidemia Father   . Cancer Father     Allergies as of  01/09/2013 - Review Complete 01/09/2013  Allergen Reaction Noted  . Neomycin Other (See Comments) 08/31/2011  . Tea tree oil Other (See Comments) 08/31/2011  . Sulfa antibiotics Other (See Comments) 08/31/2011    Current Outpatient Prescriptions on File Prior to Visit  Medication Sig Dispense Refill  . aspirin 81 MG tablet Take 81 mg by mouth daily.      . cilostazol (PLETAL) 50 MG tablet Take 50 mg by mouth 2 (two) times daily.       Marland Kitchen lisinopril (PRINIVIL,ZESTRIL) 5 MG tablet Take 5 mg by mouth daily.      . naproxen sodium (ANAPROX) 220 MG tablet Take 220 mg by mouth 2 (two) times daily as needed. For back pain      . NOVOLIN 70/30 (70-30) 100 UNIT/ML injection Inject 60 Units into the skin daily.       Marland Kitchen omeprazole (PRILOSEC) 20 MG capsule Take 20 mg by mouth every other day.      . pravastatin (PRAVACHOL) 20 MG tablet Take 1 tablet by mouth daily.      Marland Kitchen OVER THE COUNTER MEDICATION Take 1 tablet by mouth daily. Allergy medication      . rosuvastatin (CRESTOR) 40 MG tablet Take 40 mg by mouth daily.  No current facility-administered medications on file prior to visit.     REVIEW OF SYSTEMS: No change from prior visit.  The patient feels the ulcer is getting smaller  PHYSICAL EXAMINATION:   Vital signs are BP 180/65  Pulse 79  Resp 18  Ht 5\' 6"  (1.676 m)  Wt 154 lb 11.2 oz (70.171 kg)  BMI 24.98 kg/m2 General: The patient appears their stated age. HEENT:  No gross abnormalities Pulmonary:  Non labored breathing Musculoskeletal: There are no major deformities. Neurologic: No focal weakness or paresthesias are detected, Skin: Nearly healed left medial malleolus ulcer without evidence of infection Psychiatric: The patient has normal affect. Cardiovascular: There is a regular rate and rhythm without significant murmur appreciated.   Diagnostic Studies None  Assessment: Left medial malleolus ulcer in the setting of severe peripheral vascular disease Plan: The  patient has been able to nearly healed his wound without revascularization.  I have recommended continued treatment with topical ointments and dressings.  The patient will call me if this does not go on to completely heal.  I assume they will, and therefore I will have him back in one year for followup with repeat ABIs and duplex  V. Charlena Cross, M.D. [facility] [place] Office: [phone] Pager:  [phone]

## 2013-01-10 NOTE — Addendum Note (Signed)
Addended by: Sharee Pimple on: 01/10/2013 08:21 AM   Modules accepted: Orders

## 2013-01-16 ENCOUNTER — Telehealth: Payer: Self-pay

## 2013-01-16 DIAGNOSIS — L98499 Non-pressure chronic ulcer of skin of other sites with unspecified severity: Secondary | ICD-10-CM

## 2013-01-16 MED ORDER — CEPHALEXIN 500 MG PO CAPS: 500.0000 mg | ORAL_CAPSULE | Freq: Three times a day (TID) | ORAL | Status: DC

## 2013-01-16 NOTE — Telephone Encounter (Signed)
Rec'd call from pt.  Reports he has noted increased redness surrounding the ulcer of left ankle area.  States he was applying Bactroban Ointment, and a bandaid. Pt. states he questioned if he has reacted to the Bactroban oint.    Reports a small amt. of drainage from the ulcer; describes drainage as having "a white cast to it."    Denies that the ulcer has gotten larger; states the redness is the main change.  Asking for appt. to see Dr. Myra Gianotti.  Discussed with Dr. Myra Gianotti.  Rec'd v.o. to start Cephalexin 500 mg., 1 cap, po, tid x 7 days, and schedule f/u appt. 01/23/13.   Notified pt. Of order to start antibiotic.  Advised to cleanse the area with Dial soap, daily, and rinse well, and cover with clean, dry dressing.   Will notify pt. about appt. for f/u on 01/23/13.  Verb. understanding.

## 2013-01-20 ENCOUNTER — Encounter: Payer: Self-pay | Admitting: Surgery

## 2013-01-23 ENCOUNTER — Encounter: Payer: Self-pay | Admitting: Surgery

## 2013-01-23 ENCOUNTER — Ambulatory Visit (INDEPENDENT_AMBULATORY_CARE_PROVIDER_SITE_OTHER): Payer: Medicare HMO | Admitting: Surgery

## 2013-01-23 VITALS — BP 153/59 | HR 73 | Ht 66.0 in | Wt 153.0 lb

## 2013-01-23 DIAGNOSIS — I7025 Atherosclerosis of native arteries of other extremities with ulceration: Secondary | ICD-10-CM | POA: Insufficient documentation

## 2013-01-23 DIAGNOSIS — Z48812 Encounter for surgical aftercare following surgery on the circulatory system: Secondary | ICD-10-CM

## 2013-01-23 DIAGNOSIS — I739 Peripheral vascular disease, unspecified: Secondary | ICD-10-CM

## 2013-01-23 MED ORDER — LEVOFLOXACIN 500 MG PO TABS: 500.0000 mg | ORAL_TABLET | Freq: Every day | ORAL | Status: DC

## 2013-01-23 NOTE — Progress Notes (Signed)
[facility] [place]   Patient name: Steven Sanders MRN: [id number] DOB: [birth year] Sex: male     Chief Complaint  Patient presents with  . Re-evaluation    c/o increased redness around skin ulcer on L medial ankle area    HISTORY OF PRESENT ILLNESS: The patient is back today for followup.  We have been following a wound on his left medial malleolus.  It appeared to be healing.  However he called last week and felt like that it had gotten red.  He feels like in retrospect this may have been a chemical reaction to the bacitracin, as he has had a similar issue in the past with neomycin.  He was placed on Keflex and is back for followup.  He denies any pain.  He denies drainage.  He denies fever.  Past Medical History  Diagnosis Date  . Hypercholesterolemia   . Diabetes mellitus   . Peripheral vascular disease   . Hypertension     Past Surgical History  Procedure Laterality Date  . Hernia repair      History   Social History  . Marital Status: Widowed    Spouse Name: N/A    Number of Children: N/A  . Years of Education: N/A   Occupational History  . Not on file.   Social History Main Topics  . Smoking status: Former Smoker -- 1.00 packs/day    Types: Cigarettes    Quit date: 01/14/2009  . Smokeless tobacco: Never Used  . Alcohol Use: 5.4 oz/week    3 Glasses of wine, 3 Cans of beer, 3 Shots of liquor per week  . Drug Use: No  . Sexual Activity: Not on file   Other Topics Concern  . Not on file   Social History Narrative  . No narrative on file    Family History  Problem Relation Age of Onset  . Hyperlipidemia Mother   . Cancer Mother   . Hyperlipidemia Father   . Cancer Father     Allergies as of 01/23/2013 - Review Complete 01/23/2013  Allergen Reaction Noted  . Neomycin Other (See Comments) 08/31/2011  . Tea tree oil Other (See Comments) 08/31/2011  . Sulfa antibiotics Other (See Comments) 08/31/2011    Current Outpatient  Prescriptions on File Prior to Visit  Medication Sig Dispense Refill  . aspirin 81 MG tablet Take 81 mg by mouth daily.      . cilostazol (PLETAL) 50 MG tablet Take 50 mg by mouth 2 (two) times daily.       Marland Kitchen lisinopril (PRINIVIL,ZESTRIL) 5 MG tablet Take 5 mg by mouth daily.      . naproxen sodium (ANAPROX) 220 MG tablet Take 220 mg by mouth 2 (two) times daily as needed. For back pain      . NOVOLIN 70/30 (70-30) 100 UNIT/ML injection Inject 60 Units into the skin daily.       Marland Kitchen omeprazole (PRILOSEC) 20 MG capsule Take 20 mg by mouth every other day.      Marland Kitchen OVER THE COUNTER MEDICATION Take 1 tablet by mouth daily. Allergy medication      . pravastatin (PRAVACHOL) 20 MG tablet Take 1 tablet by mouth daily.      . rosuvastatin (CRESTOR) 40 MG tablet Take 40 mg by mouth daily.      . cephALEXin (KEFLEX) 500 MG capsule Take 1 capsule (500 mg total) by mouth 3 (three) times daily.  21 capsule  0   No  current facility-administered medications on file prior to visit.     REVIEW OF SYSTEMS: Please see history of present illness otherwise no changes from prior visit  PHYSICAL EXAMINATION:   Vital signs are BP 153/59  Pulse 73  Ht 5\' 6"  (1.676 m)  Wt 153 lb (69.4 kg)  BMI 24.71 kg/m2  SpO2 99% General: The patient appears their stated age. HEENT:  No gross abnormalities Pulmonary:  Non labored breathing Musculoskeletal: There are no major deformities. Neurologic: No focal weakness or paresthesias are detected, Skin: The ulcer has nearly healed.  There is an area of redness around it it looks more like a contact dermatitis which would be consistent with his history of bacitracin application.Marland Kitchen Psychiatric: The patient has normal affect. Cardiovascular: There is a regular rate and rhythm without significant murmur appreciated.   Diagnostic Studies None  Assessment: Left medial malleolus ulcer in the setting of severe peripheral vascular disease Plan: I have given the patient  prescription for 2 weeks' worth of Levaquin.  He is going to be out of town for several weeks.  He'll contact me when he returns at the wound has not healed.  If he continues to have issues with wound healing he will need to undergo revascularization.  He has severe iliac disease with a known left iliac occlusion.  He'll most likely need an aortobifemoral bypass graft.  He would like to have this done for claudication symptoms in March.  He may need to be moved up if he cannot heal his wound.  Prior to this operation he will need to get another CT angiogram with bilateral runoff to evaluate his proximal and distal targets.  I will plan on seeing him back in March and left the contact me sooner.  In March will discuss proceeding with surgery to improve his claudication symptoms.  Jorge Ny, M.D. [facility] [place] Office: [phone] Pager:  [phone]

## 2013-02-13 ENCOUNTER — Telehealth: Payer: Self-pay

## 2013-02-13 NOTE — Telephone Encounter (Signed)
Phone call from pt.  Stated he was walking in the woods yesterday,and thinks he was poked by some briars.  Stated when he showered this AM, he noticed that there were 2 very small, circular, open areas, one on top of the other, in the right mid calf area; describes "some redness in the surrounding tissue".  Stated he was wearing jeans, and whatever he was poked with went through his clothing.  Reports the largest of the 2 sores is approx. 1/8" in diameter.  Reports he applied Bactroban cream, this morning, to the affected area, and now sees some improvement in the redness.  States both open areas are very superficial.  Pt. is presently out of state ( in [place]) and scheduled to return 12/8 or 02/21/13.  Advised Dr. Myra Gianotti would not order an antibiotic for a new open area, without evaluating it first.  Advised to continue to monitor the area for worsening symptoms.  Encouraged to clean area daily and apply clean, dry bandage.  Pt. will call office if symptoms worsen.  Verb. Understanding.

## 2013-02-17 ENCOUNTER — Telehealth: Payer: Self-pay

## 2013-02-17 NOTE — Telephone Encounter (Signed)
Rec'd phone call from pt.  Requesting Dr. Myra Gianotti to phone-in an antibiotic for an area that "appears infected" on his leg.  Pt. states the small punctured areas, that he reported to the office about on 12/1, have a "dime-size red area surrounding them now", and requests an antibiotic.  Pt. is currently traveling, and is in [place].  Advised pt. that Dr. Myra Gianotti is in surgery.  Informed pt. that the MD in the office would require pt. to be evaluated, before prescribing any antibiotic.  The patient requested to send a picture of the dime-size red area, of puncture site, via cell phone or email.  Advised pt. that the doctors would not prescribe an antibiotic based on a picture of the affected area.  Encouraged him to go to an Urgent Care Center in the area where he is traveling.   Pt. voiced his disagreement that a doctor can't look at a picture of the affected area, and determine if he needs an antibiotic.  Stated he would take care of it and disconnected the phone call.

## 2013-04-18 ENCOUNTER — Other Ambulatory Visit: Payer: Self-pay | Admitting: Surgery

## 2013-04-18 DIAGNOSIS — I739 Peripheral vascular disease, unspecified: Secondary | ICD-10-CM

## 2013-04-18 DIAGNOSIS — L98499 Non-pressure chronic ulcer of skin of other sites with unspecified severity: Principal | ICD-10-CM

## 2013-05-26 ENCOUNTER — Other Ambulatory Visit: Payer: Self-pay | Admitting: Surgery

## 2013-05-26 ENCOUNTER — Encounter: Payer: Self-pay | Admitting: Surgery

## 2013-05-27 LAB — CREATININE, SERUM: Creat: 0.81 mg/dL (ref 0.50–1.35)

## 2013-05-27 LAB — BUN: BUN: 12 mg/dL (ref 6–23)

## 2013-05-29 ENCOUNTER — Ambulatory Visit
Admission: RE | Admit: 2013-05-29 | Discharge: 2013-05-29 | Disposition: A | Discharge status: Home / Self Care | Payer: Commercial Managed Care - HMO | Source: Ambulatory Visit | Attending: Surgery | Admitting: Surgery

## 2013-05-29 ENCOUNTER — Ambulatory Visit (INDEPENDENT_AMBULATORY_CARE_PROVIDER_SITE_OTHER): Payer: Commercial Managed Care - HMO | Admitting: Surgery

## 2013-05-29 ENCOUNTER — Encounter: Payer: Self-pay | Admitting: Surgery

## 2013-05-29 ENCOUNTER — Ambulatory Visit (HOSPITAL_COMMUNITY)
Admission: RE | Admit: 2013-05-29 | Discharge: 2013-05-29 | Disposition: A | Discharge status: Home / Self Care | Payer: Medicare HMO | Source: Ambulatory Visit | Attending: Surgery | Admitting: Surgery

## 2013-05-29 VITALS — BP 150/55 | HR 92 | Ht 66.0 in | Wt 148.0 lb

## 2013-05-29 DIAGNOSIS — Z48812 Encounter for surgical aftercare following surgery on the circulatory system: Secondary | ICD-10-CM

## 2013-05-29 DIAGNOSIS — L98499 Non-pressure chronic ulcer of skin of other sites with unspecified severity: Principal | ICD-10-CM

## 2013-05-29 DIAGNOSIS — Z01810 Encounter for preprocedural cardiovascular examination: Secondary | ICD-10-CM | POA: Insufficient documentation

## 2013-05-29 DIAGNOSIS — I739 Peripheral vascular disease, unspecified: Secondary | ICD-10-CM | POA: Insufficient documentation

## 2013-05-29 MED ORDER — IOHEXOL 350 MG/ML SOLN
150.0000 mL | Freq: Once | INTRAVENOUS | Status: AC | PRN
  Administered 2013-05-29: 150 mL via INTRAVENOUS

## 2013-05-29 NOTE — Addendum Note (Signed)
Addended by: Sharee PimpleMCCHESNEY, Jonavin Seder K on: 05/29/2013 03:37 PM   Modules accepted: Orders

## 2013-05-29 NOTE — Progress Notes (Signed)
 Patient name: Steven Sanders MRN: [id number] DOB: 12/04/1940 Sex: male  Chief Complaint   Patient presents with   .  Re-evaluation     f/u w/ CTA   HISTORY OF PRESENT ILLNESS:  The patient is back today for followup of his peripheral vascular disease. When I last saw him, he had several small ulcers on his legs which were getting wound care and improving. These wounds have now all resolved. He still has significant limitations with walking. He gets cramping with ambulation bilaterally. His right leg bothers him more than the left. He has been taking cilostazol with some benefit. His hypertension is medically managed with an ACE inhibitor. His hypercholesterolemia is managed with a statin. He is on insulin for diabetes. He does not have any complications from this. He is a former smoker.  Past Medical History   Diagnosis  Date   .  Hypercholesterolemia    .  Diabetes mellitus    .  Peripheral vascular disease    .  Hypertension     Past Surgical History   Procedure  Laterality  Date   .  Hernia repair      History    Social History   .  Marital Status:  Widowed     Spouse Name:  N/A     Number of Children:  N/A   .  Years of Education:  N/A    Occupational History   .  Not on file.    Social History Main Topics   .  Smoking status:  Former Smoker -- 1.00 packs/day     Types:  Cigarettes     Quit date:  01/14/2009   .  Smokeless tobacco:  Never Used   .  Alcohol Use:  5.4 oz/week     3 Glasses of wine, 3 Cans of beer, 3 Shots of liquor per week   .  Drug Use:  No   .  Sexual Activity:  Not on file    Other Topics  Concern   .  Not on file    Social History Narrative   .  No narrative on file    Family History   Problem  Relation  Age of Onset   .  Hyperlipidemia  Mother    .  Cancer  Mother    .  Hyperlipidemia  Father    .  Cancer  Father     Allergies as of 05/29/2013 - Review Complete 05/29/2013   Allergen  Reaction  Noted   .  Neomycin  Other (See  Comments)  08/31/2011   .  Tea tree oil  Other (See Comments)  08/31/2011   .  Sulfa antibiotics  Other (See Comments)  08/31/2011    Current Outpatient Prescriptions on File Prior to Visit   Medication  Sig  Dispense  Refill   .  aspirin 81 MG tablet  Take 81 mg by mouth daily.     .  cephALEXin (KEFLEX) 500 MG capsule  Take 1 capsule (500 mg total) by mouth 3 (three) times daily.  21 capsule  0   .  cilostazol (PLETAL) 50 MG tablet  Take 50 mg by mouth 2 (two) times daily.     .  lisinopril (PRINIVIL,ZESTRIL) 5 MG tablet  Take 5 mg by mouth daily.     .  naproxen sodium (ANAPROX) 220 MG tablet  Take 220 mg by mouth 2 (two) times daily as needed. For back pain     .    Take 20 mg by mouth every other day.      . pravastatin (PRAVACHOL) 20 MG tablet Take 1 tablet by mouth daily.       No current facility-administered medications on file prior to visit.     REVIEW OF SYSTEMS: Please see history of present illness, otherwise all systems are negative  PHYSICAL EXAMINATION:   Vital signs are BP 150/55  Pulse 92  Ht 5\' 6"  (1.676 m)  Wt 148 lb (67.132 kg)  BMI 23.90 kg/m2  SpO2 100% General: The patient appears their stated age. HEENT:  No gross abnormalities Pulmonary:  Non labored breathing Abdomen: Soft and non-tender Musculoskeletal: There are no major deformities. Neurologic: No focal weakness or paresthesias are detected, Skin: There are no ulcer or rashes noted. Psychiatric: The patient has normal affect. Cardiovascular: There is a regular rate and rhythm without significant murmur.  No carotid bruits. appreciated.   Diagnostic Studies Ultrasound studies were performed and  ordered today.  His ABIs 0.3 on the right and 0.3 on the left with monophasic waveforms. CT angiogram shows left iliac occlusion and 80% right common iliac stenosis, 70% right external iliac stenosis.  He has a complete occlusion of the right superficial femoral artery.  He has a critical stenosis in the left popliteal artery.  Assessment: Bilateral claudication Plan: The patient's disease and symptomatology have progressed to where he would like to proceed with aortobifemoral bypass graft.  He has a trip planned next month and would like to get this arranged for late May.  We discussed the details of the operation, including the risks and benefits which include but are not limited to cardiac complications, pulmonary complications, intestinal ischemia, lower extremity ischemia, sexual dysfunction.  All of his questions were answered.  He will need to be off of his cilostazol for one week prior to his operation.  I am sending him for a Myoview to evaluate his heart before the operation.  The patient will contact me with a date in May.  Jorge NyV. Wells Eamon Tantillo IV, M.D. [facility] [place] Office: [phone] Pager:  [phone]

## 2013-06-12 ENCOUNTER — Encounter (HOSPITAL_COMMUNITY): Payer: Medicare HMO

## 2013-06-14 ENCOUNTER — Telehealth (HOSPITAL_COMMUNITY): Payer: Self-pay

## 2013-06-15 ENCOUNTER — Ambulatory Visit (HOSPITAL_COMMUNITY)
Admission: RE | Admit: 2013-06-15 | Discharge: 2013-06-15 | Disposition: A | Discharge status: Home / Self Care | Payer: Medicare HMO | Source: Ambulatory Visit | Attending: Surgery | Admitting: Surgery

## 2013-06-15 DIAGNOSIS — Z01810 Encounter for preprocedural cardiovascular examination: Secondary | ICD-10-CM

## 2013-06-15 DIAGNOSIS — L98499 Non-pressure chronic ulcer of skin of other sites with unspecified severity: Principal | ICD-10-CM

## 2013-06-15 DIAGNOSIS — I739 Peripheral vascular disease, unspecified: Secondary | ICD-10-CM | POA: Insufficient documentation

## 2013-06-15 MED ORDER — TECHNETIUM TC 99M SESTAMIBI GENERIC - CARDIOLITE
30.0000 | Freq: Once | INTRAVENOUS | Status: AC | PRN
  Administered 2013-06-15: 30 via INTRAVENOUS

## 2013-06-15 MED ORDER — REGADENOSON 0.4 MG/5ML IV SOLN
0.4000 mg | Freq: Once | INTRAVENOUS | Status: AC
  Administered 2013-06-15: 0.4 mg via INTRAVENOUS

## 2013-06-15 MED ORDER — TECHNETIUM TC 99M SESTAMIBI GENERIC - CARDIOLITE
10.0000 | Freq: Once | INTRAVENOUS | Status: AC | PRN
  Administered 2013-06-15: 10 via INTRAVENOUS

## 2013-06-15 NOTE — Procedures (Addendum)
[facility] [address] [place] [address] [phone]  Cardiology Nuclear Med Study  Catha NottinghamMarshall J Sanders is a 73 y.o. male     MRN : [id number]     DOB: 03/31/1940  Procedure Date: 06/15/2013  Nuclear Med Background Indication for Stress Test:  Surgical Clearance History:  No prior cardiac or respiratory history reported; No prior NUC study for comparrison. Cardiac Risk Factors: Claudication, History of Smoking, Hypertension, IDDM Type 2, Lipids, PVD and atherosclerosis  Symptoms:  Pt denies symptoms.   Nuclear Pre-Procedure Caffeine/Decaff Intake:  7:00pm NPO After: 5:00am   IV Site: R Hand  IV 0.9% NS with Angio Cath:  22g  Chest Size (in):  42"  IV Started by: Emmit PomfretAmanda Hicks, RN  Height: 5\' 6"  (1.676 m)  Cup Size: n/a  BMI:  Body mass index is 23.9 kg/(m^2). Weight:  148 lb (67.132 kg)   Tech Comments:  n/a    Nuclear Med Study 1 or 2 day study: 1 day  Stress Test Type:  Lexiscan  Order Authorizing Provider:  Garner Gavelobert Elinger, MD   Resting Radionuclide: Technetium 3950m Sestamibi  Resting Radionuclide Dose: 9.5 mCi   Stress Radionuclide:  Technetium 5850m Sestamibi  Stress Radionuclide Dose: 29.3 mCi           Stress Protocol Rest HR: 51 Stress HR: 76  Rest BP: 168/71 Stress BP: 139/56  Exercise Time (min): n/a METS: n/a   Predicted Max HR: 150 bpm % Max HR: 50.67 bpm Rate Pressure Product: [id number]  Dose of Adenosine (mg):  n/a Dose of Lexiscan: 0.4 mg  Dose of Atropine (mg): n/a Dose of Dobutamine: n/a mcg/kg/min (at max HR)  Stress Test Technologist: Esperanza Sheetserry-Marie Martin, CCT Nuclear Technologist: Koren Shiverobin Moffitt, CNMT   Rest Procedure:  Myocardial perfusion imaging was performed at rest 45 minutes following the intravenous administration of Technetium 5750m Sestamibi. Stress Procedure:  The patient received IV Lexiscan 0.4 mg over 15-seconds.  Technetium 4550m Sestamibi injected IV at 30-seconds.  There were no  significant changes with Lexiscan.  Quantitative spect images were obtained after a 45 minute delay.  Transient Ischemic Dilatation (Normal <1.22):  1.18 Lung/Heart Ratio (Normal <0.45):  0.28 QGS EDV:  86 ml QGS ESV:  32 ml LV Ejection Fraction: 63%       Rest ECG: sinus bradycardia, RBBB  Stress ECG: No significant change from baseline ECG. Rare PACs  QPS Raw Data Images:  Normal; no motion artifact; normal heart/lung ratio. Stress Images:  Normal homogeneous uptake in all areas of the myocardium. Rest Images:  Normal homogeneous uptake in all areas of the myocardium. Subtraction (SDS):  No evidence of ischemia. LV Wall Motion:  NL LV Function; NL Wall Motion  Impression Exercise Capacity:  Lexiscan with no exercise. BP Response:  Normal blood pressure response. Clinical Symptoms:  No significant symptoms noted. ECG Impression:  No significant ECG changes with Lexiscan. Comparison with Prior Nuclear Study: No images to compare   Overall Impression:  Normal stress nuclear study.   Steven Sanders,Steven Gawlik, MD  06/15/2013 5:06 PM

## 2013-07-19 ENCOUNTER — Other Ambulatory Visit: Payer: Self-pay

## 2013-07-31 ENCOUNTER — Encounter (HOSPITAL_COMMUNITY): Payer: Self-pay | Admitting: Pharmacy Technician

## 2013-08-01 NOTE — Pre-Procedure Instructions (Signed)
Steven Sanders  08/01/2013   Your procedure is scheduled on:  Fri, May 29 @ 7:30 AM  [facility]  at 5:30 AM.  Call this number if you have problems the morning of surgery: [phone]   Remember:   Do not eat food or drink liquids after midnight.   Take these medicines the morning of surgery with A SIP OF WATER: Omeprazole(Prilosec)               Stop taking your Aspirin and Pletal as you have been instructed. No Goody's,BC's,Aleve,Ibuprofen,Fish Oil,or any Herbal Medications   Do not wear jewelry  Do not wear lotions, powders, or colognes. You may wear deodorant.  Men may shave face and neck.  Do not bring valuables to the hospital.  [facility] not [facility]                  for any belongings or valuables.               Contacts, dentures or bridgework may not be worn into surgery.  Leave suitcase in the car. After surgery it may be brought to your room.  For patients admitted to the hospital, discharge time is determined by your                treatment team.               Special Instructions:  [facility] - Preparing for Surgery  Before surgery, you can play an important role.  Because skin is not sterile, your skin needs to be as free of germs as possible.  You can reduce the number of germs on you skin by washing with CHG (chlorahexidine gluconate) soap before surgery.  CHG is an antiseptic cleaner which kills germs and bonds with the skin to continue killing germs even after washing.  Please DO NOT use if you have an allergy to CHG or antibacterial soaps.  If your skin becomes reddened/irritated stop using the CHG and inform your nurse when you arrive at Short Stay.  Do not shave (including legs and underarms) for at least 48 hours prior to the first CHG shower.  You may shave your face.  Please follow these instructions carefully:   1.  Shower with CHG Soap the night before surgery and the                                morning of Surgery.  2.   If you choose to wash your hair, wash your hair first as usual with your       normal shampoo.  3.  After you shampoo, rinse your hair and body thoroughly to remove the                      Shampoo.  4.  Use CHG as you would any other liquid soap.  You can apply chg directly       to the skin and wash gently with scrungie or a clean washcloth.  5.  Apply the CHG Soap to your body ONLY FROM THE NECK DOWN.        Do not use on open wounds or open sores.  Avoid contact with your eyes,       ears, mouth and genitals (private parts).  Wash genitals (private parts)       with your normal  soap.  6.  Wash thoroughly, paying special attention to the area where your surgery        will be performed.  7.  Thoroughly rinse your body with warm water from the neck down.  8.  DO NOT shower/wash with your normal soap after using and rinsing off       the CHG Soap.  9.  Pat yourself dry with a clean towel.            10.  Wear clean pajamas.            11.  Place clean sheets on your bed the night of your first shower and do not        sleep with pets.  Day of Surgery  Do not apply any lotions/deoderants the morning of surgery.  Please wear clean clothes to the hospital/surgery center.     Please read over the following fact sheets that you were given: Pain Booklet, Coughing and Deep Breathing, Blood Transfusion Information, MRSA Information and Surgical Site Infection Prevention

## 2013-08-02 ENCOUNTER — Encounter (HOSPITAL_COMMUNITY): Payer: Self-pay

## 2013-08-02 ENCOUNTER — Encounter (HOSPITAL_COMMUNITY)
Admission: RE | Admit: 2013-08-02 | Discharge: 2013-08-02 | Disposition: A | Discharge status: Home / Self Care | Payer: Medicare HMO | Source: Ambulatory Visit | Attending: Anesthesiology | Admitting: Anesthesiology

## 2013-08-02 ENCOUNTER — Encounter (HOSPITAL_COMMUNITY)
Admission: RE | Admit: 2013-08-02 | Discharge: 2013-08-02 | Disposition: A | Discharge status: Home / Self Care | Payer: Medicare HMO | Source: Ambulatory Visit | Attending: Surgery | Admitting: Surgery

## 2013-08-02 DIAGNOSIS — J449 Chronic obstructive pulmonary disease, unspecified: Secondary | ICD-10-CM | POA: Insufficient documentation

## 2013-08-02 DIAGNOSIS — Z01812 Encounter for preprocedural laboratory examination: Secondary | ICD-10-CM | POA: Insufficient documentation

## 2013-08-02 DIAGNOSIS — Z01818 Encounter for other preprocedural examination: Secondary | ICD-10-CM | POA: Insufficient documentation

## 2013-08-02 DIAGNOSIS — Z01810 Encounter for preprocedural cardiovascular examination: Secondary | ICD-10-CM | POA: Insufficient documentation

## 2013-08-02 DIAGNOSIS — I10 Essential (primary) hypertension: Secondary | ICD-10-CM | POA: Insufficient documentation

## 2013-08-02 DIAGNOSIS — J4489 Other specified chronic obstructive pulmonary disease: Secondary | ICD-10-CM | POA: Insufficient documentation

## 2013-08-02 DIAGNOSIS — Z87891 Personal history of nicotine dependence: Secondary | ICD-10-CM | POA: Insufficient documentation

## 2013-08-02 HISTORY — DX: Chronic obstructive pulmonary disease, unspecified: J44.9

## 2013-08-02 HISTORY — DX: Gastro-esophageal reflux disease without esophagitis: K21.9

## 2013-08-02 LAB — SURGICAL PCR SCREEN
MRSA, PCR: NEGATIVE
STAPHYLOCOCCUS AUREUS: NEGATIVE

## 2013-08-02 LAB — COMPREHENSIVE METABOLIC PANEL
ALBUMIN: 4 g/dL (ref 3.5–5.2)
ALT: 14 U/L (ref 0–53)
AST: 17 U/L (ref 0–37)
Alkaline Phosphatase: 63 U/L (ref 39–117)
BUN: 10 mg/dL (ref 6–23)
CALCIUM: 9.2 mg/dL (ref 8.4–10.5)
CO2: 19 mEq/L (ref 19–32)
CREATININE: 0.66 mg/dL (ref 0.50–1.35)
Chloride: 103 mEq/L (ref 96–112)
GFR calc Af Amer: >90 mL/min (ref >90)
GFR calc non Af Amer: >90 mL/min (ref >90)
Glucose, Bld: 215 mg/dL — ABNORMAL HIGH (ref 70–99)
Potassium: 4.5 mEq/L (ref 3.7–5.3)
Sodium: 137 mEq/L (ref 137–147)
Total Bilirubin: 0.6 mg/dL (ref 0.3–1.2)
Total Protein: 6.9 g/dL (ref 6.0–8.3)

## 2013-08-02 LAB — URINALYSIS, ROUTINE W REFLEX MICROSCOPIC
BILIRUBIN URINE: NEGATIVE
Glucose, UA: 500 mg/dL — AB
Hgb urine dipstick: NEGATIVE
Ketones, ur: 15 mg/dL — AB
LEUKOCYTES UA: NEGATIVE
NITRITE: NEGATIVE
PROTEIN: NEGATIVE mg/dL
Specific Gravity, Urine: 1.018 (ref 1.005–1.030)
Urobilinogen, UA: 1 mg/dL (ref 0.0–1.0)
pH: 5.5 (ref 5.0–8.0)

## 2013-08-02 LAB — CBC
HCT: 45.2 % (ref 39.0–52.0)
Hemoglobin: 15.3 g/dL (ref 13.0–17.0)
MCH: 28 pg (ref 26.0–34.0)
MCHC: 33.8 g/dL (ref 30.0–36.0)
MCV: 82.6 fL (ref 78.0–100.0)
PLATELETS: 280 10*3/uL (ref 150–400)
RBC: 5.47 MIL/uL (ref 4.22–5.81)
RDW: 15.5 % (ref 11.5–15.5)
WBC: 8.5 10*3/uL (ref 4.0–10.5)

## 2013-08-02 LAB — BLOOD GAS, ARTERIAL
Acid-base deficit: 1.7 mmol/L (ref 0.0–2.0)
Bicarbonate: 22.3 mEq/L (ref 20.0–24.0)
DRAWN BY: 206361
FIO2: 0.21 %
O2 Saturation: 96.7 %
PATIENT TEMPERATURE: 98.6
PCO2 ART: 35.9 mmHg (ref 35.0–45.0)
TCO2: 23.4 mmol/L (ref 0–100)
pH, Arterial: 7.41 (ref 7.350–7.450)
pO2, Arterial: 86.6 mmHg (ref 80.0–100.0)

## 2013-08-02 LAB — ABO/RH: ABO/RH(D): O POS

## 2013-08-02 LAB — PROTIME-INR
INR: 0.92 (ref 0.00–1.49)
PROTHROMBIN TIME: 12.2 s (ref 11.6–15.2)

## 2013-08-02 LAB — TYPE AND SCREEN
ABO/RH(D): O POS
Antibody Screen: NEGATIVE

## 2013-08-02 LAB — APTT: APTT: 30 s (ref 24–37)

## 2013-08-10 MED ORDER — CHLORHEXIDINE GLUCONATE 4 % EX LIQD
60.0000 mL | Freq: Once | CUTANEOUS | Status: DC
  Filled 2013-08-10: qty 60

## 2013-08-10 MED ORDER — DEXTROSE 5 % IV SOLN
1.5000 g | INTRAVENOUS | Status: AC
  Administered 2013-08-11 (×2): 1.5 g via INTRAVENOUS
  Filled 2013-08-10 (×2): qty 1.5

## 2013-08-11 ENCOUNTER — Encounter (HOSPITAL_COMMUNITY): Payer: Medicare HMO | Admitting: Anesthesiology

## 2013-08-11 ENCOUNTER — Encounter (HOSPITAL_COMMUNITY): Payer: Self-pay | Admitting: *Deleted

## 2013-08-11 ENCOUNTER — Encounter (HOSPITAL_COMMUNITY)
Admission: RE | Disposition: A | Discharge status: Home / Self Care | Payer: Commercial Managed Care - HMO | Source: Ambulatory Visit | Attending: Surgery

## 2013-08-11 ENCOUNTER — Inpatient Hospital Stay (HOSPITAL_COMMUNITY): Payer: Medicare HMO

## 2013-08-11 ENCOUNTER — Inpatient Hospital Stay (HOSPITAL_COMMUNITY): Payer: Medicare HMO | Admitting: Anesthesiology

## 2013-08-11 ENCOUNTER — Inpatient Hospital Stay (HOSPITAL_COMMUNITY)
Admission: RE | Admit: 2013-08-11 | Discharge: 2013-08-16 | DRG: 238 | Disposition: A | Discharge status: Home / Self Care | Payer: Medicare HMO | Source: Ambulatory Visit | Attending: Surgery | Admitting: Surgery

## 2013-08-11 DIAGNOSIS — I7409 Other arterial embolism and thrombosis of abdominal aorta: Secondary | ICD-10-CM | POA: Diagnosis present

## 2013-08-11 DIAGNOSIS — I779 Disorder of arteries and arterioles, unspecified: Secondary | ICD-10-CM | POA: Diagnosis present

## 2013-08-11 DIAGNOSIS — E876 Hypokalemia: Secondary | ICD-10-CM | POA: Diagnosis not present

## 2013-08-11 DIAGNOSIS — D72829 Elevated white blood cell count, unspecified: Secondary | ICD-10-CM | POA: Diagnosis present

## 2013-08-11 DIAGNOSIS — I70219 Atherosclerosis of native arteries of extremities with intermittent claudication, unspecified extremity: Secondary | ICD-10-CM

## 2013-08-11 DIAGNOSIS — Z87891 Personal history of nicotine dependence: Secondary | ICD-10-CM

## 2013-08-11 DIAGNOSIS — K219 Gastro-esophageal reflux disease without esophagitis: Secondary | ICD-10-CM | POA: Diagnosis present

## 2013-08-11 DIAGNOSIS — J4489 Other specified chronic obstructive pulmonary disease: Secondary | ICD-10-CM | POA: Diagnosis present

## 2013-08-11 DIAGNOSIS — Z7982 Long term (current) use of aspirin: Secondary | ICD-10-CM

## 2013-08-11 DIAGNOSIS — R110 Nausea: Secondary | ICD-10-CM | POA: Diagnosis not present

## 2013-08-11 DIAGNOSIS — I739 Peripheral vascular disease, unspecified: Principal | ICD-10-CM | POA: Diagnosis present

## 2013-08-11 DIAGNOSIS — J449 Chronic obstructive pulmonary disease, unspecified: Secondary | ICD-10-CM | POA: Diagnosis present

## 2013-08-11 DIAGNOSIS — E7800 Pure hypercholesterolemia, unspecified: Secondary | ICD-10-CM | POA: Diagnosis present

## 2013-08-11 DIAGNOSIS — E119 Type 2 diabetes mellitus without complications: Secondary | ICD-10-CM | POA: Diagnosis present

## 2013-08-11 DIAGNOSIS — Z794 Long term (current) use of insulin: Secondary | ICD-10-CM

## 2013-08-11 DIAGNOSIS — I10 Essential (primary) hypertension: Secondary | ICD-10-CM | POA: Diagnosis present

## 2013-08-11 HISTORY — PX: AORTA - BILATERAL FEMORAL ARTERY BYPASS GRAFT: SHX1175

## 2013-08-11 LAB — GLUCOSE, CAPILLARY
GLUCOSE-CAPILLARY: 197 mg/dL — AB (ref 70–99)
GLUCOSE-CAPILLARY: 153 mg/dL — AB (ref 70–99)
GLUCOSE-CAPILLARY: 91 mg/dL (ref 70–99)
GLUCOSE-CAPILLARY: 96 mg/dL (ref 70–99)
GLUCOSE-CAPILLARY: 115 mg/dL — AB (ref 70–99)
GLUCOSE-CAPILLARY: 186 mg/dL — AB (ref 70–99)
Glucose-Capillary: 183 mg/dL — ABNORMAL HIGH (ref 70–99)
Glucose-Capillary: 114 mg/dL — ABNORMAL HIGH (ref 70–99)
Glucose-Capillary: 94 mg/dL (ref 70–99)
Glucose-Capillary: 96 mg/dL (ref 70–99)

## 2013-08-11 LAB — CBC
HCT: 35.3 % — ABNORMAL LOW (ref 39.0–52.0)
Hemoglobin: 11.8 g/dL — ABNORMAL LOW (ref 13.0–17.0)
MCH: 28 pg (ref 26.0–34.0)
MCHC: 33.4 g/dL (ref 30.0–36.0)
MCV: 83.6 fL (ref 78.0–100.0)
PLATELETS: 198 10*3/uL (ref 150–400)
RBC: 4.22 MIL/uL (ref 4.22–5.81)
RDW: 15.4 % (ref 11.5–15.5)
WBC: 17.2 10*3/uL — AB (ref 4.0–10.5)

## 2013-08-11 LAB — POCT I-STAT 7, (LYTES, BLD GAS, ICA,H+H)
ACID-BASE DEFICIT: 4 mmol/L — AB (ref 0.0–2.0)
Acid-Base Excess: 3 mmol/L — ABNORMAL HIGH (ref 0.0–2.0)
BICARBONATE: 21.7 meq/L (ref 20.0–24.0)
Bicarbonate: 27.8 mEq/L — ABNORMAL HIGH (ref 20.0–24.0)
Calcium, Ion: 1.16 mmol/L (ref 1.13–1.30)
Calcium, Ion: 1.12 mmol/L — ABNORMAL LOW (ref 1.13–1.30)
HEMATOCRIT: 38 % — AB (ref 39.0–52.0)
HEMATOCRIT: 36 % — AB (ref 39.0–52.0)
Hemoglobin: 12.9 g/dL — ABNORMAL LOW (ref 13.0–17.0)
Hemoglobin: 12.2 g/dL — ABNORMAL LOW (ref 13.0–17.0)
O2 Saturation: 100 %
O2 Saturation: 98 %
PO2 ART: 207 mmHg — AB (ref 80.0–100.0)
PO2 ART: 120 mmHg — AB (ref 80.0–100.0)
Potassium: 4.9 mEq/L (ref 3.7–5.3)
Potassium: 4.1 mEq/L (ref 3.7–5.3)
SODIUM: 138 meq/L (ref 137–147)
Sodium: 135 mEq/L — ABNORMAL LOW (ref 137–147)
TCO2: 29 mmol/L (ref 0–100)
TCO2: 23 mmol/L (ref 0–100)
pCO2 arterial: 43.2 mmHg (ref 35.0–45.0)
pCO2 arterial: 39.6 mmHg (ref 35.0–45.0)
pH, Arterial: 7.416 (ref 7.350–7.450)
pH, Arterial: 7.346 — ABNORMAL LOW (ref 7.350–7.450)

## 2013-08-11 LAB — BLOOD GAS, ARTERIAL
ACID-BASE DEFICIT: 0.6 mmol/L (ref 0.0–2.0)
Bicarbonate: 24.4 mEq/L — ABNORMAL HIGH (ref 20.0–24.0)
O2 CONTENT: 3 L/min
O2 SAT: 98.5 %
PATIENT TEMPERATURE: 97.6
PCO2 ART: 45 mmHg (ref 35.0–45.0)
TCO2: 25.8 mmol/L (ref 0–100)
pH, Arterial: 7.351 (ref 7.350–7.450)
pO2, Arterial: 88 mmHg (ref 80.0–100.0)

## 2013-08-11 LAB — BASIC METABOLIC PANEL
BUN: 11 mg/dL (ref 6–23)
CHLORIDE: 103 meq/L (ref 96–112)
CO2: 23 mEq/L (ref 19–32)
Calcium: 8.1 mg/dL — ABNORMAL LOW (ref 8.4–10.5)
Creatinine, Ser: 0.59 mg/dL (ref 0.50–1.35)
GFR calc non Af Amer: >90 mL/min (ref >90)
Glucose, Bld: 190 mg/dL — ABNORMAL HIGH (ref 70–99)
POTASSIUM: 4 meq/L (ref 3.7–5.3)
Sodium: 137 mEq/L (ref 137–147)

## 2013-08-11 LAB — MAGNESIUM: Magnesium: 1.6 mg/dL (ref 1.5–2.5)

## 2013-08-11 LAB — POCT I-STAT 4, (NA,K, GLUC, HGB,HCT)
Glucose, Bld: 320 mg/dL — ABNORMAL HIGH (ref 70–99)
HCT: 37 % — ABNORMAL LOW (ref 39.0–52.0)
HEMOGLOBIN: 12.6 g/dL — AB (ref 13.0–17.0)
Potassium: 5.2 mEq/L (ref 3.7–5.3)
SODIUM: 134 meq/L — AB (ref 137–147)

## 2013-08-11 LAB — APTT: APTT: 25 s (ref 24–37)

## 2013-08-11 LAB — PROTIME-INR
INR: 1.16 (ref 0.00–1.49)
Prothrombin Time: 14.6 seconds (ref 11.6–15.2)

## 2013-08-11 LAB — POCT I-STAT GLUCOSE
GLUCOSE: 263 mg/dL — AB (ref 70–99)
OPERATOR ID: 282221

## 2013-08-11 SURGERY — CREATION, BYPASS, ARTERIAL, AORTA TO FEMORAL, BILATERAL, USING GRAFT
Anesthesia: General | Site: Abdomen

## 2013-08-11 MED ORDER — MIDAZOLAM HCL 5 MG/5ML IJ SOLN
INTRAMUSCULAR | Status: DC | PRN
  Administered 2013-08-11 (×2): 1 mg via INTRAVENOUS

## 2013-08-11 MED ORDER — MIDAZOLAM HCL 2 MG/2ML IJ SOLN
INTRAMUSCULAR | Status: AC
  Filled 2013-08-11: qty 2

## 2013-08-11 MED ORDER — ARTIFICIAL TEARS OP OINT
TOPICAL_OINTMENT | OPHTHALMIC | Status: DC | PRN
  Administered 2013-08-11: 1 via OPHTHALMIC

## 2013-08-11 MED ORDER — VECURONIUM BROMIDE 10 MG IV SOLR
INTRAVENOUS | Status: AC
  Filled 2013-08-11: qty 10

## 2013-08-11 MED ORDER — MORPHINE SULFATE 2 MG/ML IJ SOLN
2.0000 mg | INTRAMUSCULAR | Status: DC | PRN
  Administered 2013-08-11: 2 mg via INTRAVENOUS
  Administered 2013-08-12 (×8): 4 mg via INTRAVENOUS
  Filled 2013-08-11 (×8): qty 2

## 2013-08-11 MED ORDER — SODIUM CHLORIDE 0.9 % IV SOLN: INTRAVENOUS | Status: DC

## 2013-08-11 MED ORDER — LABETALOL HCL 5 MG/ML IV SOLN
INTRAVENOUS | Status: DC | PRN
  Administered 2013-08-11 (×2): 5 mg via INTRAVENOUS

## 2013-08-11 MED ORDER — NITROGLYCERIN IN D5W 200-5 MCG/ML-% IV SOLN
INTRAVENOUS | Status: DC | PRN
  Administered 2013-08-11: 5 ug/min via INTRAVENOUS

## 2013-08-11 MED ORDER — HEMOSTATIC AGENTS (NO CHARGE) OPTIME
TOPICAL | Status: DC | PRN
  Administered 2013-08-11: 1 via TOPICAL

## 2013-08-11 MED ORDER — GUAIFENESIN-DM 100-10 MG/5ML PO SYRP: 15.0000 mL | ORAL_SOLUTION | ORAL | Status: DC | PRN

## 2013-08-11 MED ORDER — LABETALOL HCL 5 MG/ML IV SOLN
INTRAVENOUS | Status: AC
  Filled 2013-08-11: qty 4

## 2013-08-11 MED ORDER — FENTANYL CITRATE 0.05 MG/ML IJ SOLN
INTRAMUSCULAR | Status: AC
  Filled 2013-08-11: qty 5

## 2013-08-11 MED ORDER — DOPAMINE-DEXTROSE 3.2-5 MG/ML-% IV SOLN: 3.0000 ug/kg/min | INTRAVENOUS | Status: DC

## 2013-08-11 MED ORDER — DOCUSATE SODIUM 100 MG PO CAPS: 100.0000 mg | ORAL_CAPSULE | Freq: Every day | ORAL | Status: DC

## 2013-08-11 MED ORDER — HEPARIN SODIUM (PORCINE) 1000 UNIT/ML IJ SOLN
INTRAMUSCULAR | Status: AC
  Filled 2013-08-11: qty 1

## 2013-08-11 MED ORDER — PROTAMINE SULFATE 10 MG/ML IV SOLN
INTRAVENOUS | Status: AC
  Filled 2013-08-11: qty 5

## 2013-08-11 MED ORDER — INSULIN ASPART PROT & ASPART (70-30 MIX) 100 UNIT/ML ~~LOC~~ SUSP
60.0000 [IU] | Freq: Every day | SUBCUTANEOUS | Status: DC
  Filled 2013-08-11: qty 10

## 2013-08-11 MED ORDER — SODIUM CHLORIDE 0.9 % IV SOLN: 500.0000 mL | Freq: Once | INTRAVENOUS | Status: AC | PRN

## 2013-08-11 MED ORDER — SODIUM CHLORIDE 0.9 % IR SOLN
Status: DC | PRN
  Administered 2013-08-11: 09:00:00

## 2013-08-11 MED ORDER — ESMOLOL HCL 10 MG/ML IV SOLN
INTRAVENOUS | Status: DC | PRN
  Administered 2013-08-11: 30 mg via INTRAVENOUS

## 2013-08-11 MED ORDER — LISINOPRIL 5 MG PO TABS
5.0000 mg | ORAL_TABLET | Freq: Every day | ORAL | Status: DC
  Filled 2013-08-11: qty 1

## 2013-08-11 MED ORDER — METOPROLOL TARTRATE 1 MG/ML IV SOLN: 2.0000 mg | INTRAVENOUS | Status: DC | PRN

## 2013-08-11 MED ORDER — DEXMEDETOMIDINE BOLUS VIA INFUSION: 0.7000 ug/kg | Freq: Once | INTRAVENOUS | Status: DC

## 2013-08-11 MED ORDER — ALBUMIN HUMAN 5 % IV SOLN
INTRAVENOUS | Status: AC
  Administered 2013-08-11: 12.5 g via INTRAVENOUS
  Filled 2013-08-11: qty 250

## 2013-08-11 MED ORDER — LACTATED RINGERS IV SOLN
INTRAVENOUS | Status: DC | PRN
  Administered 2013-08-11 (×2): via INTRAVENOUS

## 2013-08-11 MED ORDER — LIDOCAINE HCL (CARDIAC) 20 MG/ML IV SOLN
INTRAVENOUS | Status: AC
  Filled 2013-08-11: qty 5

## 2013-08-11 MED ORDER — OXYCODONE HCL 5 MG PO TABS: 5.0000 mg | ORAL_TABLET | ORAL | Status: DC | PRN

## 2013-08-11 MED ORDER — HYDROMORPHONE HCL PF 1 MG/ML IJ SOLN
INTRAMUSCULAR | Status: AC
  Administered 2013-08-11: 0.5 mg via INTRAVENOUS
  Filled 2013-08-11: qty 1

## 2013-08-11 MED ORDER — HYDRALAZINE HCL 20 MG/ML IJ SOLN: 10.0000 mg | INTRAMUSCULAR | Status: DC | PRN

## 2013-08-11 MED ORDER — ARTIFICIAL TEARS OP OINT
TOPICAL_OINTMENT | OPHTHALMIC | Status: AC
  Filled 2013-08-11: qty 3.5

## 2013-08-11 MED ORDER — DEXTROSE 5 % IV SOLN
30.0000 ug/min | INTRAVENOUS | Status: DC
  Administered 2013-08-11: 30 ug/min via INTRAVENOUS
  Filled 2013-08-11: qty 1

## 2013-08-11 MED ORDER — ONDANSETRON HCL 4 MG/2ML IJ SOLN
INTRAMUSCULAR | Status: DC | PRN
  Administered 2013-08-11: 4 mg via INTRAVENOUS

## 2013-08-11 MED ORDER — ROCURONIUM BROMIDE 50 MG/5ML IV SOLN
INTRAVENOUS | Status: AC
  Filled 2013-08-11: qty 1

## 2013-08-11 MED ORDER — ONDANSETRON HCL 4 MG/2ML IJ SOLN: 4.0000 mg | Freq: Once | INTRAMUSCULAR | Status: DC | PRN

## 2013-08-11 MED ORDER — EPHEDRINE SULFATE 50 MG/ML IJ SOLN
INTRAMUSCULAR | Status: AC
  Filled 2013-08-11: qty 1

## 2013-08-11 MED ORDER — PROPOFOL 10 MG/ML IV BOLUS
INTRAVENOUS | Status: DC | PRN
  Administered 2013-08-11: 130 mg via INTRAVENOUS

## 2013-08-11 MED ORDER — NEOSTIGMINE METHYLSULFATE 10 MG/10ML IV SOLN
INTRAVENOUS | Status: AC
  Filled 2013-08-11: qty 1

## 2013-08-11 MED ORDER — HYDROMORPHONE HCL PF 1 MG/ML IJ SOLN
0.5000 mg | INTRAMUSCULAR | Status: AC | PRN
  Administered 2013-08-11 (×4): 0.5 mg via INTRAVENOUS

## 2013-08-11 MED ORDER — SODIUM CHLORIDE 0.9 % IJ SOLN
INTRAMUSCULAR | Status: AC
  Filled 2013-08-11: qty 10

## 2013-08-11 MED ORDER — MORPHINE SULFATE 2 MG/ML IJ SOLN
INTRAMUSCULAR | Status: AC
  Administered 2013-08-11: 2 mg via INTRAVENOUS
  Filled 2013-08-11: qty 1

## 2013-08-11 MED ORDER — BISACODYL 10 MG RE SUPP: 10.0000 mg | Freq: Every day | RECTAL | Status: DC | PRN

## 2013-08-11 MED ORDER — LABETALOL HCL 5 MG/ML IV SOLN
10.0000 mg | INTRAVENOUS | Status: DC | PRN
  Filled 2013-08-11: qty 4

## 2013-08-11 MED ORDER — SODIUM CHLORIDE 0.9 % IV SOLN
INTRAVENOUS | Status: DC
  Administered 2013-08-11: 7.8 [IU]/h via INTRAVENOUS
  Filled 2013-08-11: qty 1

## 2013-08-11 MED ORDER — INSULIN ASPART 100 UNIT/ML ~~LOC~~ SOLN
0.0000 [IU] | SUBCUTANEOUS | Status: DC
  Administered 2013-08-11: 3 [IU] via SUBCUTANEOUS
  Administered 2013-08-12: 8 [IU] via SUBCUTANEOUS
  Administered 2013-08-12: 3 [IU] via SUBCUTANEOUS
  Administered 2013-08-12: 5 [IU] via SUBCUTANEOUS
  Administered 2013-08-12 (×2): 3 [IU] via SUBCUTANEOUS
  Administered 2013-08-13: 5 [IU] via SUBCUTANEOUS
  Administered 2013-08-13 (×2): 3 [IU] via SUBCUTANEOUS

## 2013-08-11 MED ORDER — ALBUMIN HUMAN 5 % IV SOLN
INTRAVENOUS | Status: DC | PRN
  Administered 2013-08-11: 11:00:00 via INTRAVENOUS

## 2013-08-11 MED ORDER — KETOROLAC TROMETHAMINE 30 MG/ML IJ SOLN
30.0000 mg | Freq: Once | INTRAMUSCULAR | Status: AC
  Administered 2013-08-11: 30 mg via INTRAVENOUS

## 2013-08-11 MED ORDER — ONDANSETRON HCL 4 MG/2ML IJ SOLN
4.0000 mg | Freq: Four times a day (QID) | INTRAMUSCULAR | Status: DC | PRN
  Administered 2013-08-12 – 2013-08-14 (×4): 4 mg via INTRAVENOUS
  Filled 2013-08-11 (×4): qty 2

## 2013-08-11 MED ORDER — 0.9 % SODIUM CHLORIDE (POUR BTL) OPTIME
TOPICAL | Status: DC | PRN
  Administered 2013-08-11: 1000 mL

## 2013-08-11 MED ORDER — SUCCINYLCHOLINE CHLORIDE 20 MG/ML IJ SOLN
INTRAMUSCULAR | Status: AC
  Filled 2013-08-11: qty 1

## 2013-08-11 MED ORDER — NEOSTIGMINE METHYLSULFATE 10 MG/10ML IV SOLN
INTRAVENOUS | Status: DC | PRN
  Administered 2013-08-11: 4 mg via INTRAVENOUS

## 2013-08-11 MED ORDER — HYDRALAZINE HCL 20 MG/ML IJ SOLN
INTRAMUSCULAR | Status: AC
  Filled 2013-08-11: qty 1

## 2013-08-11 MED ORDER — CILOSTAZOL 50 MG PO TABS
50.0000 mg | ORAL_TABLET | Freq: Two times a day (BID) | ORAL | Status: DC
  Filled 2013-08-11 (×3): qty 1

## 2013-08-11 MED ORDER — PHENOL 1.4 % MT LIQD: 1.0000 | OROMUCOSAL | Status: DC | PRN

## 2013-08-11 MED ORDER — GLYCOPYRROLATE 0.2 MG/ML IJ SOLN
INTRAMUSCULAR | Status: AC
  Filled 2013-08-11: qty 3

## 2013-08-11 MED ORDER — LACTATED RINGERS IV SOLN
INTRAVENOUS | Status: DC | PRN
  Administered 2013-08-11 (×2): via INTRAVENOUS

## 2013-08-11 MED ORDER — ALUM & MAG HYDROXIDE-SIMETH 200-200-20 MG/5ML PO SUSP: 15.0000 mL | ORAL | Status: DC | PRN

## 2013-08-11 MED ORDER — GLYCOPYRROLATE 0.2 MG/ML IJ SOLN
INTRAMUSCULAR | Status: AC
  Filled 2013-08-11: qty 1

## 2013-08-11 MED ORDER — POTASSIUM CHLORIDE CRYS ER 20 MEQ PO TBCR: 20.0000 meq | EXTENDED_RELEASE_TABLET | Freq: Every day | ORAL | Status: DC | PRN

## 2013-08-11 MED ORDER — ASPIRIN EC 81 MG PO TBEC
81.0000 mg | DELAYED_RELEASE_TABLET | Freq: Every day | ORAL | Status: DC
  Filled 2013-08-11: qty 1

## 2013-08-11 MED ORDER — CEFUROXIME SODIUM 1.5 G IJ SOLR
1.5000 g | Freq: Two times a day (BID) | INTRAMUSCULAR | Status: AC
  Administered 2013-08-11 – 2013-08-12 (×2): 1.5 g via INTRAVENOUS
  Filled 2013-08-11 (×2): qty 1.5

## 2013-08-11 MED ORDER — ALBUMIN HUMAN 5 % IV SOLN
12.5000 g | Freq: Once | INTRAVENOUS | Status: AC
  Administered 2013-08-11: 12.5 g via INTRAVENOUS

## 2013-08-11 MED ORDER — KETOROLAC TROMETHAMINE 30 MG/ML IJ SOLN
INTRAMUSCULAR | Status: AC
  Filled 2013-08-11: qty 1

## 2013-08-11 MED ORDER — HEPARIN SODIUM (PORCINE) 1000 UNIT/ML IJ SOLN
INTRAMUSCULAR | Status: DC | PRN
  Administered 2013-08-11: 1000 [IU] via INTRAVENOUS
  Administered 2013-08-11: 6000 [IU] via INTRAVENOUS

## 2013-08-11 MED ORDER — PANTOPRAZOLE SODIUM 40 MG PO TBEC
40.0000 mg | DELAYED_RELEASE_TABLET | Freq: Every day | ORAL | Status: DC
Start: 2013-08-12 — End: 2013-08-12

## 2013-08-11 MED ORDER — PHENYLEPHRINE HCL 10 MG/ML IJ SOLN
INTRAMUSCULAR | Status: AC
  Filled 2013-08-11: qty 1

## 2013-08-11 MED ORDER — DEXTROSE 5 % IV SOLN
10.0000 mg | INTRAVENOUS | Status: DC | PRN
  Administered 2013-08-11: 20 ug/min via INTRAVENOUS

## 2013-08-11 MED ORDER — PHENYLEPHRINE HCL 10 MG/ML IJ SOLN
INTRAMUSCULAR | Status: DC | PRN
  Administered 2013-08-11: 40 ug via INTRAVENOUS
  Administered 2013-08-11: 120 ug via INTRAVENOUS

## 2013-08-11 MED ORDER — VECURONIUM BROMIDE 10 MG IV SOLR
INTRAVENOUS | Status: DC | PRN
  Administered 2013-08-11: 2 mg via INTRAVENOUS
  Administered 2013-08-11: 1 mg via INTRAVENOUS
  Administered 2013-08-11: 2 mg via INTRAVENOUS
  Administered 2013-08-11: 1 mg via INTRAVENOUS

## 2013-08-11 MED ORDER — MAGNESIUM SULFATE 40 MG/ML IJ SOLN
2.0000 g | Freq: Every day | INTRAMUSCULAR | Status: AC | PRN
  Administered 2013-08-11: 2 g via INTRAVENOUS
  Filled 2013-08-11: qty 50

## 2013-08-11 MED ORDER — GLYCOPYRROLATE 0.2 MG/ML IJ SOLN
INTRAMUSCULAR | Status: DC | PRN
  Administered 2013-08-11: 0.6 mg via INTRAVENOUS
  Administered 2013-08-11 (×2): 0.1 mg via INTRAVENOUS

## 2013-08-11 MED ORDER — ACETAMINOPHEN 325 MG PO TABS: 325.0000 mg | ORAL_TABLET | ORAL | Status: DC | PRN

## 2013-08-11 MED ORDER — PROTAMINE SULFATE 10 MG/ML IV SOLN
INTRAVENOUS | Status: DC | PRN
  Administered 2013-08-11 (×2): 10 mg via INTRAVENOUS
  Administered 2013-08-11 (×2): 15 mg via INTRAVENOUS

## 2013-08-11 MED ORDER — ATORVASTATIN CALCIUM 80 MG PO TABS
80.0000 mg | ORAL_TABLET | Freq: Every day | ORAL | Status: DC
  Filled 2013-08-11: qty 1

## 2013-08-11 MED ORDER — PHENYLEPHRINE 40 MCG/ML (10ML) SYRINGE FOR IV PUSH (FOR BLOOD PRESSURE SUPPORT)
PREFILLED_SYRINGE | INTRAVENOUS | Status: AC
  Filled 2013-08-11: qty 10

## 2013-08-11 MED ORDER — FENTANYL CITRATE 0.05 MG/ML IJ SOLN
INTRAMUSCULAR | Status: DC | PRN
  Administered 2013-08-11: 100 ug via INTRAVENOUS
  Administered 2013-08-11 (×2): 50 ug via INTRAVENOUS
  Administered 2013-08-11 (×2): 100 ug via INTRAVENOUS
  Administered 2013-08-11 (×2): 50 ug via INTRAVENOUS
  Administered 2013-08-11: 200 ug via INTRAVENOUS
  Administered 2013-08-11: 50 ug via INTRAVENOUS

## 2013-08-11 MED ORDER — KCL IN DEXTROSE-NACL 20-5-0.45 MEQ/L-%-% IV SOLN
INTRAVENOUS | Status: DC
  Administered 2013-08-11: 16:00:00 via INTRAVENOUS
  Filled 2013-08-11 (×2): qty 1000

## 2013-08-11 MED ORDER — HYDROMORPHONE HCL PF 1 MG/ML IJ SOLN
0.2500 mg | INTRAMUSCULAR | Status: DC | PRN
  Administered 2013-08-11 (×4): 0.5 mg via INTRAVENOUS

## 2013-08-11 MED ORDER — PROPOFOL 10 MG/ML IV BOLUS
INTRAVENOUS | Status: AC
  Filled 2013-08-11: qty 20

## 2013-08-11 MED ORDER — LACTATED RINGERS IV SOLN
INTRAVENOUS | Status: DC | PRN
  Administered 2013-08-11 (×2): via INTRAVENOUS

## 2013-08-11 MED ORDER — ONDANSETRON HCL 4 MG/2ML IJ SOLN
INTRAMUSCULAR | Status: AC
  Filled 2013-08-11: qty 2

## 2013-08-11 MED ORDER — KCL IN DEXTROSE-NACL 20-5-0.45 MEQ/L-%-% IV SOLN
INTRAVENOUS | Status: AC
  Filled 2013-08-11: qty 1000

## 2013-08-11 MED ORDER — ENOXAPARIN SODIUM 40 MG/0.4ML ~~LOC~~ SOLN
40.0000 mg | SUBCUTANEOUS | Status: DC
  Administered 2013-08-12 – 2013-08-16 (×5): 40 mg via SUBCUTANEOUS
  Filled 2013-08-11 (×5): qty 0.4

## 2013-08-11 MED ORDER — ACETAMINOPHEN 650 MG RE SUPP: 325.0000 mg | RECTAL | Status: DC | PRN

## 2013-08-11 MED ORDER — ESMOLOL HCL 10 MG/ML IV SOLN
INTRAVENOUS | Status: AC
  Filled 2013-08-11: qty 10

## 2013-08-11 MED ORDER — DEXMEDETOMIDINE HCL IN NACL 200 MCG/50ML IV SOLN
0.4000 ug/kg/h | Freq: Once | INTRAVENOUS | Status: DC
  Filled 2013-08-11: qty 50

## 2013-08-11 MED ORDER — ROCURONIUM BROMIDE 100 MG/10ML IV SOLN
INTRAVENOUS | Status: DC | PRN
  Administered 2013-08-11: 50 mg via INTRAVENOUS

## 2013-08-11 MED FILL — Sodium Chloride IV Soln 0.9%: INTRAVENOUS | Qty: 1000 | Status: AC

## 2013-08-11 MED FILL — Heparin Sodium (Porcine) Inj 1000 Unit/ML: INTRAMUSCULAR | Qty: 30 | Status: AC

## 2013-08-11 MED FILL — Sodium Chloride Irrigation Soln 0.9%: Qty: 3000 | Status: AC

## 2013-08-11 SURGICAL SUPPLY — 77 items
BLADE 10 SAFETY STRL DISP (BLADE) ×3 IMPLANT
BLADE SURG CLIPPER 3M 9600 (MISCELLANEOUS) ×3 IMPLANT
CANISTER SUCTION 2500CC (MISCELLANEOUS) ×3 IMPLANT
CLIP TI MEDIUM 24 (CLIP) ×3 IMPLANT
CLIP TI WIDE RED SMALL 24 (CLIP) ×3 IMPLANT
COVER MAYO STAND STRL (DRAPES) ×3 IMPLANT
COVER SURGICAL LIGHT HANDLE (MISCELLANEOUS) ×3 IMPLANT
DERMABOND ADVANCED (GAUZE/BANDAGES/DRESSINGS) ×6
DERMABOND ADVANCED .7 DNX12 (GAUZE/BANDAGES/DRESSINGS) ×3 IMPLANT
DRAPE WARM FLUID 44X44 (DRAPE) ×3 IMPLANT
DRSG COVADERM 4X14 (GAUZE/BANDAGES/DRESSINGS) ×3 IMPLANT
ELECT BLADE 4.0 EZ CLEAN MEGAD (MISCELLANEOUS) ×3
ELECT BLADE 6.5 EXT (BLADE) IMPLANT
ELECT CAUTERY BLADE 6.4 (BLADE) ×3 IMPLANT
ELECT REM PT RETURN 9FT ADLT (ELECTROSURGICAL) ×3
ELECTRODE BLDE 4.0 EZ CLN MEGD (MISCELLANEOUS) ×1 IMPLANT
ELECTRODE REM PT RTRN 9FT ADLT (ELECTROSURGICAL) ×1 IMPLANT
FELT TEFLON 4X4 (MISCELLANEOUS) ×6 IMPLANT
GLOVE BIO SURGEON STRL SZ 6.5 (GLOVE) ×6 IMPLANT
GLOVE BIO SURGEONS STRL SZ 6.5 (GLOVE) ×3
GLOVE BIOGEL PI IND STRL 6.5 (GLOVE) ×4 IMPLANT
GLOVE BIOGEL PI IND STRL 7.0 (GLOVE) ×4 IMPLANT
GLOVE BIOGEL PI IND STRL 7.5 (GLOVE) ×1 IMPLANT
GLOVE BIOGEL PI INDICATOR 6.5 (GLOVE) ×8
GLOVE BIOGEL PI INDICATOR 7.0 (GLOVE) ×8
GLOVE BIOGEL PI INDICATOR 7.5 (GLOVE) ×2
GLOVE ECLIPSE 6.5 STRL STRAW (GLOVE) ×6 IMPLANT
GLOVE SS BIOGEL STRL SZ 7 (GLOVE) ×2 IMPLANT
GLOVE SUPERSENSE BIOGEL SZ 7 (GLOVE) ×4
GLOVE SURG SS PI 7.5 STRL IVOR (GLOVE) ×3 IMPLANT
GOWN STRL REUS W/ TWL LRG LVL3 (GOWN DISPOSABLE) ×4 IMPLANT
GOWN STRL REUS W/ TWL XL LVL3 (GOWN DISPOSABLE) ×3 IMPLANT
GOWN STRL REUS W/TWL LRG LVL3 (GOWN DISPOSABLE) ×8
GOWN STRL REUS W/TWL XL LVL3 (GOWN DISPOSABLE) ×6
GRAFT HEMASHIELD 14X8MM (Vascular Products) ×3 IMPLANT
HEMOSTAT SNOW SURGICEL 2X4 (HEMOSTASIS) IMPLANT
INSERT FOGARTY 61MM (MISCELLANEOUS) ×3 IMPLANT
INSERT FOGARTY SM (MISCELLANEOUS) ×6 IMPLANT
KIT BASIN OR (CUSTOM PROCEDURE TRAY) ×3 IMPLANT
KIT ROOM TURNOVER OR (KITS) ×3 IMPLANT
NS IRRIG 1000ML POUR BTL (IV SOLUTION) ×6 IMPLANT
PACK AORTA (CUSTOM PROCEDURE TRAY) ×3 IMPLANT
PAD ARMBOARD 7.5X6 YLW CONV (MISCELLANEOUS) ×6 IMPLANT
PENCIL BUTTON HOLSTER BLD 10FT (ELECTRODE) ×3 IMPLANT
SPONGE INTESTINAL PEANUT (DISPOSABLE) ×3 IMPLANT
SUT ETHIBOND 5 LR DA (SUTURE) IMPLANT
SUT PDS AB 1 TP1 54 (SUTURE) ×6 IMPLANT
SUT PROLENE 3 0 SH 48 (SUTURE) ×12 IMPLANT
SUT PROLENE 3 0 SH DA (SUTURE) ×15 IMPLANT
SUT PROLENE 4 0 RB 1 (SUTURE) ×6
SUT PROLENE 4-0 RB1 .5 CRCL 36 (SUTURE) ×3 IMPLANT
SUT PROLENE 5 0 C 1 24 (SUTURE) IMPLANT
SUT PROLENE 5 0 C 1 36 (SUTURE) IMPLANT
SUT PROLENE 5 0 CC 1 (SUTURE) ×3 IMPLANT
SUT PROLENE 6 0 C 1 30 (SUTURE) ×3 IMPLANT
SUT PROLENE 7 0 BV 1 (SUTURE) ×3 IMPLANT
SUT SILK 2 0 (SUTURE) ×2
SUT SILK 2 0 TIES 17X18 (SUTURE) ×2
SUT SILK 2 0SH CR/8 30 (SUTURE) ×3 IMPLANT
SUT SILK 2-0 18XBRD TIE 12 (SUTURE) ×1 IMPLANT
SUT SILK 2-0 18XBRD TIE BLK (SUTURE) ×1 IMPLANT
SUT SILK 3 0 (SUTURE) ×4
SUT SILK 3 0 TIES 17X18 (SUTURE) ×2
SUT SILK 3-0 18XBRD TIE 12 (SUTURE) ×2 IMPLANT
SUT SILK 3-0 18XBRD TIE BLK (SUTURE) ×1 IMPLANT
SUT SILK 4 0 (SUTURE) ×2
SUT SILK 4-0 18XBRD TIE 12 (SUTURE) ×1 IMPLANT
SUT VIC AB 2-0 CT1 27 (SUTURE) ×12
SUT VIC AB 2-0 CT1 TAPERPNT 27 (SUTURE) ×6 IMPLANT
SUT VIC AB 3-0 SH 27 (SUTURE) ×8
SUT VIC AB 3-0 SH 27X BRD (SUTURE) ×4 IMPLANT
SUT VICRYL 4-0 PS2 18IN ABS (SUTURE) ×12 IMPLANT
TOWEL BLUE STERILE X RAY DET (MISCELLANEOUS) ×6 IMPLANT
TOWEL OR 17X24 6PK STRL BLUE (TOWEL DISPOSABLE) ×6 IMPLANT
TOWEL OR 17X26 10 PK STRL BLUE (TOWEL DISPOSABLE) ×6 IMPLANT
TRAY FOLEY CATH 16FRSI W/METER (SET/KITS/TRAYS/PACK) ×3 IMPLANT
WATER STERILE IRR 1000ML POUR (IV SOLUTION) ×6 IMPLANT

## 2013-08-11 NOTE — Op Note (Signed)
Patient name: Steven Sanders MRN: [id number] DOB: [birth year] Sex: male  08/11/2013 Pre-operative Diagnosis: Bilateral claudication  Post-operative diagnosis:  Same Surgeon:  Nada Libman Assistants:  Betti Cruz Procedure:   Aorto-BiFemoral Bypass with 14x8 darcyon graft Anesthesia:  Gen. Blood Loss:  See anesthesia record Specimens:  Aortic plaque   Findings:  End to end proximal anastomosis.  The right femoral anastomosis was to the proximal profunda.  The left was to the distal common femoral artery  Indications:  The patient has lifestyle limiting claudication.  He has a history of ulcers which ultimately healed.  Imaging studies revealed severe iliac disease.  He also has stenosis/occlusion of the superficial femoral artery.  Procedure:  The patient was identified in the holding area and taken to [facility] OR ROOM 11  The patient was then placed supine on the table. general anesthesia was administered.  The patient was prepped and draped in the usual sterile fashion.  A time out was called and antibiotics were administered.  Longitudinal inguinal incisions were made bilaterally.  Cautery was used to divide the subcutaneous tissue down to the femoral sheath.  The femoral sheath was then opened sharply.  The common femoral artery was exposed from the inguinal ligament down to the bifurcation.  Both common femoral and profunda femoral arteries were exposed bilaterally.  The distal external iliac artery was mobilized underneath the angle ligament.  A large crossing vein was divided on the left side.  No pain was on the right.  Once adequate femoral exposure was obtained, the groins were packed with a moist gauze.  Attention was turned towards the abdomen.  A midline abdominal incision was made from the xiphoid to below the umbilicus.  Cautery was used to divide the subcutaneous tissue down to the fascia.  The fascia was then opened with cautery.  I sharply entered the peritoneal cavity and then  opened up the remaining fashion throughout the length of the incision.  The abdomen was then inspected.  There was no gross pathology.  There was a small amount of adhesions in the right lower quadrant which were taken down sharply.  A Balfour was placed.  The transverse colon was reflected cephalad.  An Omni-Tract was used to aid with exposure.  The small bowel was mobilized to the patient's right.  The ligament of Treitz was taken down sharply.  The aorta was then circumferentially dissected free.  There was a retroaortic left renal vein.  I encircled the inferior mesenteric artery.  I got circumferential control of the aorta at the level of the renal arteries.  Next, I created a tunnel between the abdominal and femoral incisions, passing posterior to the ureter.  The patient was then fully heparinized.  A Harken clamp was used to occlude the aorta just below the renal arteries.  A DeBakey clamp was used to occlude the distal aorta.  The aorta was then transected.  I performed an endarterectomy on the distal infrarenal abdominal aorta.  This was then sutured closed with 2 layers of 3-0 Prolene.  I selected a 14 x 8 dacryon graft.  I then performed an endarterectomy of the proximal aorta.  I performed a end to end anastomosis.  I placed 3 posterior wall sutures with 3-0 Prolene using a horizontal mattress technique.  A felt strip was incorporated.  The remaining portion of the anastomosis was once circumferentially around the aorta.  The anastomosis was completed.  The proximal clamp was released.  The proximal anastomosis  was hemostatic.  Both limbs of the graft were flushed and then brought through their respective groins.  Dr. Hart RochesterLawson perform the left femoral anastomosis, I performed a right.  The left femoral vessels were occluded.  An 11 blade was used to make an arteriotomy on the distal common femoral artery which was extended longitudinally with Potts scissors.  The graft was cut to the appropriate length  and spatulated.  A candidate to side anastomosis was then created with running 5-0 Prolene.  Prior to completion, the appropriate flushing maneuvers were performed and the anastomosis was completed.  Blood flow was reestablished to the left leg.  I performed a right femoral anastomosis.  After occluded the femoral vessels a #11 blade was used to make an arteriotomy which was extended longitudinally with Potts scissors.  There was significant plaque and possible occlusion of the common femoral artery.  I performed an endarterectomy in the common femoral artery.  I got a good proximal and distal endpoint.  I did place 27 a tacking sutures in the profunda femoral artery.  I did open the arteriotomy up onto the profunda femoral artery.  The graft was cut to the appropriate length and spatulated.  A running anastomosis was created with 5-0 Prolene.  Prior to completion, the appropriate flushing maneuvers were performed and the anastomosis was completed.  Flow was reestablished to the right leg.  There were excellent graft dependent signals in the profunda femoral superficial femoral arteries bilaterally.  The patient was given 50 mg of protamine.  Once the groins were hemostatic, they were closed in multiple layers of 2-0, 3-0 and 4-0 Vicryl.  Attention was turned towards the abdomen.  Hemostasis was achieved.  The retroperitoneum was reapproximated with running 2-0 Vicryl.  The abdominal contents were placed back into their anatomic location making sure that the bowel mesentery was not twisted.  The fascia was closed with 2 running #1 PDS suture.  The subcutaneous tissue was closed with 3-0 Vicryl the skin with 4-0 Vicryl.  There were no immediate complications.  Dermabond was placed on the incisions the  Disposition:  To PACU in stable condition.   Juleen ChinaV. Wells Horacio Werth, M.D. [facility] [place] Office: [phone] Pager:  [phone]

## 2013-08-11 NOTE — H&P (Signed)
Patient name: Steven Sanders MRN: [id number] DOB: [birth year] Sex: male  Chief Complaint   Patient presents with   .  Re-evaluation     f/u w/ CTA   HISTORY OF PRESENT ILLNESS:  The patient is back today for followup of his peripheral vascular disease. When I last saw him, he had several small ulcers on his legs which were getting wound care and improving. These wounds have now all resolved. He still has significant limitations with walking. He gets cramping with ambulation bilaterally. His right leg bothers him more than the left. He has been taking cilostazol with some benefit. His hypertension is medically managed with an ACE inhibitor. His hypercholesterolemia is managed with a statin. He is on insulin for diabetes. He does not have any complications from this. He is a former smoker.  Past Medical History   Diagnosis  Date   .  Hypercholesterolemia    .  Diabetes mellitus    .  Peripheral vascular disease    .  Hypertension     Past Surgical History   Procedure  Laterality  Date   .  Hernia repair      History    Social History   .  Marital Status:  Widowed     Spouse Name:  N/A     Number of Children:  N/A   .  Years of Education:  N/A    Occupational History   .  Not on file.    Social History Main Topics   .  Smoking status:  Former Smoker -- 1.00 packs/day     Types:  Cigarettes     Quit date:  01/14/2009   .  Smokeless tobacco:  Never Used   .  Alcohol Use:  5.4 oz/week     3 Glasses of wine, 3 Cans of beer, 3 Shots of liquor per week   .  Drug Use:  No   .  Sexual Activity:  Not on file    Other Topics  Concern   .  Not on file    Social History Narrative   .  No narrative on file    Family History   Problem  Relation  Age of Onset   .  Hyperlipidemia  Mother    .  Cancer  Mother    .  Hyperlipidemia  Father    .  Cancer  Father     Allergies as of 05/29/2013 - Review Complete 05/29/2013   Allergen  Reaction  Noted   .  Neomycin  Other (See  Comments)  08/31/2011   .  Tea tree oil  Other (See Comments)  08/31/2011   .  Sulfa antibiotics  Other (See Comments)  08/31/2011    Current Outpatient Prescriptions on File Prior to Visit   Medication  Sig  Dispense  Refill   .  aspirin 81 MG tablet  Take 81 mg by mouth daily.     .  cephALEXin (KEFLEX) 500 MG capsule  Take 1 capsule (500 mg total) by mouth 3 (three) times daily.  21 capsule  0   .  cilostazol (PLETAL) 50 MG tablet  Take 50 mg by mouth 2 (two) times daily.     Marland Kitchen  lisinopril (PRINIVIL,ZESTRIL) 5 MG tablet  Take 5 mg by mouth daily.     .  naproxen sodium (ANAPROX) 220 MG tablet  Take 220 mg by mouth 2 (two) times daily as needed. For back pain     .  NOVOLIN 70/30 (70-30) 100 UNIT/ML injection  Inject 60 Units into the skin daily.     Marland Kitchen.  OVER THE COUNTER MEDICATION  Take 1 tablet by mouth daily. Allergy medication     .  rosuvastatin (CRESTOR) 40 MG tablet  Take 40 mg by mouth daily.     Marland Kitchen.  levofloxacin (LEVAQUIN) 500 MG tablet  Take 1 tablet (500 mg total) by mouth daily.  14 tablet  0   .  omeprazole (PRILOSEC) 20 MG capsule  Take 20 mg by mouth every other day.     .  pravastatin (PRAVACHOL) 20 MG tablet  Take 1 tablet by mouth daily.      No current facility-administered medications on file prior to visit.   REVIEW OF SYSTEMS:  Please see history of present illness, otherwise all systems are negative  PHYSICAL EXAMINATION:  Vital signs are BP 150/55  Pulse 92  Ht 5\' 6"  (1.676 m)  Wt 148 lb (67.132 kg)  BMI 23.90 kg/m2  SpO2 100%  General: The patient appears their stated age.  HEENT: No gross abnormalities  Pulmonary: Non labored breathing  Abdomen: Soft and non-tender  Musculoskeletal: There are no major deformities.  Neurologic: No focal weakness or paresthesias are detected,  Skin: There are no ulcer or rashes noted.  Psychiatric: The patient has normal affect.  Cardiovascular: There is a regular rate and rhythm without significant murmur. No carotid  bruits. appreciated.  Diagnostic Studies  Ultrasound studies were performed and ordered today. His ABIs 0.3 on the right and 0.3 on the left with monophasic waveforms.  CT angiogram shows left iliac occlusion and 80% right common iliac stenosis, 70% right external iliac stenosis. He has a complete occlusion of the right superficial femoral artery. He has a critical stenosis in the left popliteal artery.  Assessment:  Bilateral claudication  Plan:  The patient's disease and symptomatology have progressed to where he would like to proceed with aortobifemoral bypass graft. He has a trip planned next month and would like to get this arranged for late May. We discussed the details of the operation, including the risks and benefits which include but are not limited to cardiac complications, pulmonary complications, intestinal ischemia, lower extremity ischemia, sexual dysfunction. All of his questions were answered. He will need to be off of his cilostazol for one week prior to his operation. I am sending him for a Myoview to evaluate his heart before the operation. The patient will contact me with a date in May.  Jorge NyV. Wells Brabham IV, M.D.  [facility] [place]  Office: [phone]  Pager: [phone]

## 2013-08-11 NOTE — Anesthesia Procedure Notes (Signed)
Procedure Name: Intubation Date/Time: 08/11/2013 7:43 AM Performed by: Leonel Ramsay Pre-anesthesia Checklist: Patient identified, Patient being monitored, Emergency Drugs available, Timeout performed and Suction available Patient Re-evaluated:Patient Re-evaluated prior to inductionOxygen Delivery Method: Circle system utilized Preoxygenation: Pre-oxygenation with 100% oxygen Intubation Type: IV induction Ventilation: Mask ventilation without difficulty and Oral airway inserted - appropriate to patient size Laryngoscope Size: Mac and 3 Grade View: Grade I Tube type: Oral Tube size: 7.5 mm Number of attempts: 1 Airway Equipment and Method: Stylet and Oral airway Placement Confirmation: ETT inserted through vocal cords under direct vision,  positive ETCO2 and breath sounds checked- equal and bilateral Secured at: 22 cm Tube secured with: Tape Dental Injury: Teeth and Oropharynx as per pre-operative assessment

## 2013-08-11 NOTE — Transfer of Care (Signed)
Immediate Anesthesia Transfer of Care Note  Patient: Steven Sanders  Procedure(s) Performed: Procedure(s): AORTA BIFEMORAL BYPASS GRAFT (N/A)  Patient Location: PACU  Anesthesia Type:General  Level of Consciousness: awake, alert  and oriented  Airway & Oxygen Therapy: Patient Spontanous Breathing and Patient connected to nasal cannula oxygen  Post-op Assessment: Report given to PACU RN and Post -op Vital signs reviewed and stable  Post vital signs: Reviewed and stable  Complications: No apparent anesthesia complications

## 2013-08-11 NOTE — Anesthesia Postprocedure Evaluation (Signed)
  Anesthesia Post-op Note  Patient: Steven Sanders  Procedure(s) Performed: Procedure(s): AORTA BIFEMORAL BYPASS GRAFT (N/A)  Patient Location: PACU  Anesthesia Type:General  Level of Consciousness: awake, alert  and oriented  Airway and Oxygen Therapy: Patient Spontanous Breathing and Patient connected to nasal cannula oxygen  Post-op Pain: mild  Post-op Assessment: Post-op Vital signs reviewed, Patient's Cardiovascular Status Stable, Respiratory Function Stable, Patent Airway and Pain level controlled  Post-op Vital Signs: stable  Last Vitals:  Filed Vitals:   08/11/13 1619  BP:   Pulse: 68  Temp:   Resp: 17    Complications: No apparent anesthesia complications

## 2013-08-11 NOTE — Anesthesia Preprocedure Evaluation (Addendum)
Anesthesia Evaluation  Patient identified by MRN, date of birth, ID band Patient awake    Reviewed: Allergy & Precautions, H&P , NPO status , Patient's Chart, lab work & pertinent test results  History of Anesthesia Complications Negative for: history of anesthetic complications  Airway Mallampati: II TM Distance: >3 FB Neck ROM: Full    Dental  (+) Edentulous Upper   Pulmonary COPDformer smoker,  breath sounds clear to auscultation        Cardiovascular hypertension, Pt. on medications + Peripheral Vascular Disease Rhythm:Regular Rate:Normal     Neuro/Psych    GI/Hepatic GERD-  Medicated and Controlled,  Endo/Other  diabetesGlucose 183  Renal/GU      Musculoskeletal   Abdominal   Peds  Hematology   Anesthesia Other Findings   Reproductive/Obstetrics                        Anesthesia Physical Anesthesia Plan  ASA: III  Anesthesia Plan: General   Post-op Pain Management:    Induction: Intravenous  Airway Management Planned: Oral ETT  Additional Equipment: Arterial line, CVP and PA Cath  Intra-op Plan:   Post-operative Plan: Extubation in OR  Informed Consent: I have reviewed the patients History and Physical, chart, labs and discussed the procedure including the risks, benefits and alternatives for the proposed anesthesia with the patient or authorized representative who has indicated his/her understanding and acceptance.   Dental advisory given  Plan Discussed with: Anesthesiologist and Surgeon  Anesthesia Plan Comments: (Aortic occlusive disease  with claudication and toe ulcer Type 2 DM glucose 183 Hypertension COPD H/O smoking Normal nuclear stress test 06/15/13 nl LV function  Plan GA with art line, Theone Murdoch catheter  Kipp Brood)    Anesthesia Quick Evaluation

## 2013-08-12 ENCOUNTER — Inpatient Hospital Stay (HOSPITAL_COMMUNITY): Payer: Medicare HMO

## 2013-08-12 ENCOUNTER — Encounter (HOSPITAL_COMMUNITY): Payer: Self-pay

## 2013-08-12 LAB — GLUCOSE, CAPILLARY
GLUCOSE-CAPILLARY: 197 mg/dL — AB (ref 70–99)
GLUCOSE-CAPILLARY: 283 mg/dL — AB (ref 70–99)
Glucose-Capillary: 189 mg/dL — ABNORMAL HIGH (ref 70–99)
Glucose-Capillary: 222 mg/dL — ABNORMAL HIGH (ref 70–99)
Glucose-Capillary: 195 mg/dL — ABNORMAL HIGH (ref 70–99)

## 2013-08-12 LAB — MAGNESIUM: MAGNESIUM: 2.2 mg/dL (ref 1.5–2.5)

## 2013-08-12 LAB — COMPREHENSIVE METABOLIC PANEL
ALBUMIN: 3.3 g/dL — AB (ref 3.5–5.2)
ALT: 26 U/L (ref 0–53)
AST: 33 U/L (ref 0–37)
Alkaline Phosphatase: 50 U/L (ref 39–117)
BUN: 9 mg/dL (ref 6–23)
CALCIUM: 8.4 mg/dL (ref 8.4–10.5)
CO2: 21 mEq/L (ref 19–32)
Chloride: 101 mEq/L (ref 96–112)
Creatinine, Ser: 0.61 mg/dL (ref 0.50–1.35)
GFR calc non Af Amer: >90 mL/min (ref >90)
GLUCOSE: 278 mg/dL — AB (ref 70–99)
POTASSIUM: 4.9 meq/L (ref 3.7–5.3)
Sodium: 139 mEq/L (ref 137–147)
TOTAL PROTEIN: 5.2 g/dL — AB (ref 6.0–8.3)
Total Bilirubin: 0.8 mg/dL (ref 0.3–1.2)

## 2013-08-12 LAB — AMYLASE: Amylase: 17 U/L (ref 0–105)

## 2013-08-12 LAB — CBC
HCT: 35.3 % — ABNORMAL LOW (ref 39.0–52.0)
HEMOGLOBIN: 11.7 g/dL — AB (ref 13.0–17.0)
MCH: 28.1 pg (ref 26.0–34.0)
MCHC: 33.1 g/dL (ref 30.0–36.0)
MCV: 84.7 fL (ref 78.0–100.0)
Platelets: 182 10*3/uL (ref 150–400)
RBC: 4.17 MIL/uL — ABNORMAL LOW (ref 4.22–5.81)
RDW: 15.9 % — AB (ref 11.5–15.5)
WBC: 14.3 10*3/uL — ABNORMAL HIGH (ref 4.0–10.5)

## 2013-08-12 MED ORDER — PNEUMOCOCCAL VAC POLYVALENT 25 MCG/0.5ML IJ INJ: 0.5000 mL | INJECTION | INTRAMUSCULAR | Status: DC | PRN

## 2013-08-12 MED ORDER — PNEUMOCOCCAL VAC POLYVALENT 25 MCG/0.5ML IJ INJ: 0.5000 mL | INJECTION | INTRAMUSCULAR | Status: DC

## 2013-08-12 MED ORDER — PANTOPRAZOLE SODIUM 40 MG IV SOLR
40.0000 mg | INTRAVENOUS | Status: DC
  Administered 2013-08-12 – 2013-08-16 (×5): 40 mg via INTRAVENOUS
  Filled 2013-08-12 (×6): qty 40

## 2013-08-12 MED ORDER — SODIUM CHLORIDE 0.9 % IV SOLN
INTRAVENOUS | Status: DC
  Administered 2013-08-12: 75 mL/h via INTRAVENOUS
  Administered 2013-08-12 – 2013-08-14 (×2): via INTRAVENOUS

## 2013-08-12 NOTE — Evaluation (Signed)
Physical Therapy Evaluation Patient Details Name: Steven Sanders MRN: [id number] DOB: [birth year] Today's Date: 08/12/2013   History of Present Illness  Pt admit for aortobifemoral BPG.    Clinical Impression  Pt admitted with above. Pt currently with functional limitations due to the deficits listed below (see PT Problem List). Should progress well and go home with HHPT and friend to stay with him for a few weeks.  Will probably need RW and 3N1.  Pt will benefit from skilled PT to increase their independence and safety with mobility to allow discharge to the venue listed below.     Follow Up Recommendations Home health PT;Supervision/Assistance - 24 hour    Equipment Recommendations  Rolling walker with 5" wheels;3in1 (PT)    Recommendations for Other Services       Precautions / Restrictions Precautions Precautions: Fall Restrictions Weight Bearing Restrictions: No      Mobility  Bed Mobility Overal bed mobility: Needs Assistance Bed Mobility: Supine to Sit     Supine to sit: Mod assist     General bed mobility comments: Asssit for LEs and elevation of trunk.   Transfers Overall transfer level: Needs assistance Equipment used: 2 person hand held assist Transfers: Sit to/from [employer]GI CorporationStand;Stand Pivot Transfers Sit to Stand: Min assist Stand pivot transfers: Min assist       General transfer comment: Pt able to power up but needed +2 HHA for stability.  Pt needed steadying assist to stand up and for the pivot transfer.  Pt needed a little assist with weight shifting.  Limited in moving too far as pt with NG tube in place.   Ambulation/Gait                Stairs            Wheelchair Mobility    Modified Rankin (Stroke Patients Only)       Balance Overall balance assessment: Needs assistance;History of Falls Sitting-balance support: Bilateral upper extremity supported;Feet supported Sitting balance-Leahy Scale: Fair Sitting balance - Comments:  Needed UE support for balance.   Standing balance support: Bilateral upper extremity supported;During functional activity Standing balance-Leahy Scale: Poor Standing balance comment: Needs UE support for balance.                              Pertinent Vitals/Pain VSS, right leg pain per pt and some abdominal pain but pt did not rate.     Home Living Family/patient expects to be discharged to:: Private residence Living Arrangements: Alone Available Help at Discharge: Friend(s);Family;Available 24 hours/day Type of Home: House Home Access: Stairs to enter Entrance Stairs-Rails: None Entrance Stairs-Number of Steps: 1 Home Layout: Multi-level Home Equipment: Cane - single point      Prior Function Level of Independence: Independent with assistive device(s)         Comments: Used cane for last week or so     Hand Dominance   Dominant Hand: Right    Extremity/Trunk Assessment   Upper Extremity Assessment: Defer to OT evaluation           Lower Extremity Assessment: Generalized weakness      Cervical / Trunk Assessment: Normal  Communication   Communication: No difficulties  Cognition Arousal/Alertness: Awake/alert Behavior During Therapy: WFL for tasks assessed/performed Overall Cognitive Status: Within Functional Limits for tasks assessed  General Comments      Exercises General Exercises - Lower Extremity Ankle Circles/Pumps: AROM;5 reps;Both;Seated Long Arc Quad: AROM;Both;5 reps;Seated      Assessment/Plan    PT Assessment Patient needs continued PT services  PT Diagnosis Generalized weakness;Acute pain   PT Problem List Decreased activity tolerance;Decreased balance;Decreased mobility;Decreased knowledge of use of DME;Decreased safety awareness;Decreased knowledge of precautions;Pain  PT Treatment Interventions DME instruction;Gait training;Therapeutic activities;Functional mobility training;Therapeutic  exercise;Balance training;Patient/family education   PT Goals (Current goals can be found in the Care Plan section) Acute Rehab PT Goals Patient Stated Goal: to go home PT Goal Formulation: With patient Time For Goal Achievement: 08/19/13 Potential to Achieve Goals: Good    Frequency Min 3X/week   Barriers to discharge        Co-evaluation               End of Session Equipment Utilized During Treatment: Gait belt;Oxygen Activity Tolerance: Patient limited by fatigue Patient left: in chair;with call bell/phone within reach;with family/visitor present Nurse Communication: Mobility status         Time: 4097-3532 PT Time Calculation (min): 24 min   Charges:   PT Evaluation $Initial PT Evaluation Tier I: 1 Procedure PT Treatments $Therapeutic Activity: 8-22 mins   PT G Codes:          Barb Merino 2013-09-05, 9:19 AM [facility] [phone] (pager)

## 2013-08-12 NOTE — Progress Notes (Signed)
   Daily Progress Note  Assessment/Planning: POD #1 s/p ABF   Appropriate recovery  D/C SG cath  D/C A-line  D/C Foley  Chg MIVF to NS  Tsfr to 3000  Subjective  - 1 Day Post-Op  Pain controlled  Objective Filed Vitals:   08/12/13 0500 08/12/13 0530 08/12/13 0600 08/12/13 0630  BP: 130/48 146/50 130/54 137/53  Pulse: 81 84 84 82  Temp: 99.3 F (37.4 C) 99.1 F (37.3 C) 99.1 F (37.3 C) 99 F (37.2 C)  TempSrc:      Resp: 22 28 12 27   Height:      Weight:      SpO2: 100% 100% 100% 100%    Intake/Output Summary (Last 24 hours) at 08/12/13 0744 Last data filed at 08/12/13 0600  Gross per 24 hour  Intake   4800 ml  Output   3500 ml  Net   1300 ml    PULM  BLL rales CV  RRR GI  soft, approp TTP, inc bandage, both groins bdg, hypo BS VASC  Palpable R PT, dopplerable L DP  Laboratory CBC    Component Value Date/Time   WBC 14.3* 08/12/2013 0357   HGB 11.7* 08/12/2013 0357   HCT 35.3* 08/12/2013 0357   PLT 182 08/12/2013 0357    BMET    Component Value Date/Time   NA 139 08/12/2013 0357   K 4.9 08/12/2013 0357   CL 101 08/12/2013 0357   CO2 21 08/12/2013 0357   GLUCOSE 278* 08/12/2013 0357   BUN 9 08/12/2013 0357   CREATININE 0.61 08/12/2013 0357   CREATININE 0.81 05/26/2013 1440   CALCIUM 8.4 08/12/2013 0357   GFRNONAA >90 08/12/2013 0357   GFRAA >90 08/12/2013 0357    Leonides Sake, MD [facility] [place] Office: [phone] Pager: [phone]  08/12/2013, 7:44 AM

## 2013-08-13 LAB — GLUCOSE, CAPILLARY
GLUCOSE-CAPILLARY: 199 mg/dL — AB (ref 70–99)
Glucose-Capillary: 171 mg/dL — ABNORMAL HIGH (ref 70–99)
Glucose-Capillary: 195 mg/dL — ABNORMAL HIGH (ref 70–99)
Glucose-Capillary: 199 mg/dL — ABNORMAL HIGH (ref 70–99)
Glucose-Capillary: 237 mg/dL — ABNORMAL HIGH (ref 70–99)
Glucose-Capillary: 214 mg/dL — ABNORMAL HIGH (ref 70–99)
Glucose-Capillary: 189 mg/dL — ABNORMAL HIGH (ref 70–99)

## 2013-08-13 LAB — CBC
HCT: 31.4 % — ABNORMAL LOW (ref 39.0–52.0)
HEMOGLOBIN: 10.2 g/dL — AB (ref 13.0–17.0)
MCH: 27.8 pg (ref 26.0–34.0)
MCHC: 32.5 g/dL (ref 30.0–36.0)
MCV: 85.6 fL (ref 78.0–100.0)
Platelets: 175 10*3/uL (ref 150–400)
RBC: 3.67 MIL/uL — ABNORMAL LOW (ref 4.22–5.81)
RDW: 16.1 % — ABNORMAL HIGH (ref 11.5–15.5)
WBC: 20.5 10*3/uL — ABNORMAL HIGH (ref 4.0–10.5)

## 2013-08-13 LAB — URINALYSIS, ROUTINE W REFLEX MICROSCOPIC
BILIRUBIN URINE: NEGATIVE
Glucose, UA: >1000 mg/dL — AB
Ketones, ur: >80 mg/dL — AB
Leukocytes, UA: NEGATIVE
Nitrite: NEGATIVE
PH: 5 (ref 5.0–8.0)
Protein, ur: 30 mg/dL — AB
SPECIFIC GRAVITY, URINE: 1.026 (ref 1.005–1.030)
Urobilinogen, UA: 0.2 mg/dL (ref 0.0–1.0)

## 2013-08-13 LAB — BASIC METABOLIC PANEL
BUN: 9 mg/dL (ref 6–23)
CO2: 19 meq/L (ref 19–32)
Calcium: 8.6 mg/dL (ref 8.4–10.5)
Chloride: 104 mEq/L (ref 96–112)
Creatinine, Ser: 0.6 mg/dL (ref 0.50–1.35)
GFR calc Af Amer: >90 mL/min (ref >90)
GFR calc non Af Amer: >90 mL/min (ref >90)
Glucose, Bld: 225 mg/dL — ABNORMAL HIGH (ref 70–99)
POTASSIUM: 4.9 meq/L (ref 3.7–5.3)
SODIUM: 142 meq/L (ref 137–147)

## 2013-08-13 LAB — HEMOGLOBIN A1C
HEMOGLOBIN A1C: 7.3 % — AB (ref <5.7)
MEAN PLASMA GLUCOSE: 163 mg/dL — AB (ref <117)

## 2013-08-13 LAB — URINE MICROSCOPIC-ADD ON

## 2013-08-13 MED ORDER — INSULIN ASPART 100 UNIT/ML ~~LOC~~ SOLN
0.0000 [IU] | Freq: Three times a day (TID) | SUBCUTANEOUS | Status: DC
  Administered 2013-08-13: 7 [IU] via SUBCUTANEOUS
  Administered 2013-08-13: 4 [IU] via SUBCUTANEOUS
  Administered 2013-08-14: 15 [IU] via SUBCUTANEOUS
  Administered 2013-08-14: 4 [IU] via SUBCUTANEOUS
  Administered 2013-08-14: 11 [IU] via SUBCUTANEOUS
  Administered 2013-08-15: 7 [IU] via SUBCUTANEOUS
  Administered 2013-08-15: 4 [IU] via SUBCUTANEOUS
  Administered 2013-08-16: 3 [IU] via SUBCUTANEOUS

## 2013-08-13 MED ORDER — INSULIN ASPART PROT & ASPART (70-30 MIX) 100 UNIT/ML ~~LOC~~ SUSP
60.0000 [IU] | Freq: Every day | SUBCUTANEOUS | Status: DC
  Filled 2013-08-13: qty 10

## 2013-08-13 NOTE — Progress Notes (Addendum)
NG fell out when patient stood to move to chair. Unable to replace due to falling on floor. Pt because nauseated, provided Zofran and try to place 18 Fr (floor stock NG Tube) but unable to Place.  Ordered 14Fr and 10 Fr. Unable to place 14 Fr, but success with 10 Fr. Nausea has subside and NG is  Pulling light green bile at this time. Will continue to monitor.   MD was notified of NG tube falling out and orders given to leave NG tube out unless patient became symptomatic. Please see above for symptomatic change. Will continue to monitor for further changes.

## 2013-08-13 NOTE — Progress Notes (Signed)
NG tube pulled out by pt while repositioning from bed to chair at approx 2100. Attempted to replace x2 but unsuccessful, tube continued to coil in pt mouth. Pt without any complaints of n/v or pain at this time. Dr Imogene Burn notified, states ok to leave NG out. Will continue monitoring pt for changes

## 2013-08-13 NOTE — Progress Notes (Addendum)
  [facility] Progress Note  08/13/2013 7:26 AM 2 Days Post-Op  Subjective:  States he is not passing gas  Afebrile x 24 hrs HR 90's regular 120's-150's systolic 93% 3LO2NC  Gtts:  none  Filed Vitals:   08/13/13 0323  BP: 139/61  Pulse: 96  Temp: 98 F (36.7 C)  Resp: 20    Physical Exam: Cardiac:  regular Lungs:  CTAB Abdomen:  Soft; slightly distended, -BS, -flatus Incisions:  Bilateral groins are c/d/i with some ecchymosis; laparotomy incision is c/d/i with ecchymosis Extremities:  + doppler signal bilateral DP/PT; bilateral feet are warm  CBC    Component Value Date/Time   WBC 20.5* 08/13/2013 0504   RBC 3.67* 08/13/2013 0504   HGB 10.2* 08/13/2013 0504   HCT 31.4* 08/13/2013 0504   PLT 175 08/13/2013 0504   MCV 85.6 08/13/2013 0504   MCH 27.8 08/13/2013 0504   MCHC 32.5 08/13/2013 0504   RDW 16.1* 08/13/2013 0504    BMET    Component Value Date/Time   NA 142 08/13/2013 0504   K 4.9 08/13/2013 0504   CL 104 08/13/2013 0504   CO2 19 08/13/2013 0504   GLUCOSE 225* 08/13/2013 0504   BUN 9 08/13/2013 0504   CREATININE 0.60 08/13/2013 0504   CREATININE 0.81 05/26/2013 1440   CALCIUM 8.6 08/13/2013 0504   GFRNONAA >90 08/13/2013 0504   GFRAA >90 08/13/2013 0504    INR    Component Value Date/Time   INR 1.16 08/11/2013 1215     Intake/Output Summary (Last 24 hours) at 08/13/13 0726 Last data filed at 08/13/13 0600  Gross per 24 hour  Intake 946.67 ml  Output   3185 ml  Net -2238.33 ml     Assessment/Plan:  73 y.o. male is s/p  Aorto-BiFemoral Bypass with 14x8 darcyon graft 2 Days Post-Op  -pt without flatus, decreased BS, and increased output from NGT-keep NPO -leukocytosis worse this am-will check a u/a this morning.  Wounds look good.  Will order IS 10x/hr.  Pt is afebrile x 24hr. K+ stable at 4.9 and unchanged from yesterday.  BUN/Cr normal with good UOP -pt needs to be OOB to chair-he states he has not been up to the chair -UOP-pt  did have to have I&O cath last pm, but has since been voiding with good UOP.  -since pt NPO, regularly scheduled Novolog was discontinued-he has continued to by hyperglycemic- Will ask DM coordinator to see pt and will order HgbA1c. -Hgb drifting downward-pt tolerating-will recheck CBC in am -NGT with 500cc last shift-will keep this in place today (total 650/24hr) -check labs in am -restart home meds when taking po's -keep in stepdown today  Doreatha Massed, PA-C [facility] [phone] 08/13/2013 7:26 AM   Addendum  I have independently interviewed and examined the patient, and I agree with the physician assistant's findings.  All incision intact.  Appropriate abd TTP.  K stable at 4.9.  WBC spike to 20.5.  Will check UA and CXR.  No clinical evidence of infection currently.  Will hold home insulin regimen and bump SSI to resistant.  Would keep him in stepdown one more day.  Leonides Sake, MD [facility] [place] Office: [phone] Pager: [phone]  08/13/2013, 8:26 AM

## 2013-08-14 ENCOUNTER — Encounter (HOSPITAL_COMMUNITY): Payer: Self-pay | Admitting: Surgery

## 2013-08-14 LAB — CBC
HEMATOCRIT: 30.7 % — AB (ref 39.0–52.0)
HEMOGLOBIN: 9.8 g/dL — AB (ref 13.0–17.0)
MCH: 27.7 pg (ref 26.0–34.0)
MCHC: 31.9 g/dL (ref 30.0–36.0)
MCV: 86.7 fL (ref 78.0–100.0)
Platelets: 197 10*3/uL (ref 150–400)
RBC: 3.54 MIL/uL — AB (ref 4.22–5.81)
RDW: 16.3 % — AB (ref 11.5–15.5)
WBC: 20.8 10*3/uL — ABNORMAL HIGH (ref 4.0–10.5)

## 2013-08-14 LAB — GLUCOSE, CAPILLARY
GLUCOSE-CAPILLARY: 336 mg/dL — AB (ref 70–99)
GLUCOSE-CAPILLARY: 278 mg/dL — AB (ref 70–99)
GLUCOSE-CAPILLARY: 156 mg/dL — AB (ref 70–99)
Glucose-Capillary: 114 mg/dL — ABNORMAL HIGH (ref 70–99)

## 2013-08-14 LAB — BASIC METABOLIC PANEL
BUN: 16 mg/dL (ref 6–23)
CO2: 11 mEq/L — ABNORMAL LOW (ref 19–32)
Calcium: 8.8 mg/dL (ref 8.4–10.5)
Chloride: 106 mEq/L (ref 96–112)
Creatinine, Ser: 0.66 mg/dL (ref 0.50–1.35)
GFR calc non Af Amer: >90 mL/min (ref >90)
GLUCOSE: 309 mg/dL — AB (ref 70–99)
POTASSIUM: 4.8 meq/L (ref 3.7–5.3)
Sodium: 144 mEq/L (ref 137–147)

## 2013-08-14 MED ORDER — INSULIN GLARGINE 100 UNIT/ML ~~LOC~~ SOLN
40.0000 [IU] | Freq: Every day | SUBCUTANEOUS | Status: DC
  Administered 2013-08-14 – 2013-08-15 (×2): 40 [IU] via SUBCUTANEOUS
  Filled 2013-08-14 (×3): qty 0.4

## 2013-08-14 NOTE — Progress Notes (Addendum)
Inpatient Diabetes Program Recommendations  AACE/ADA: New Consensus Statement on Inpatient Glycemic Control (2013)  Target Ranges:  Prepandial:   less than 140 mg/dL      Peak postprandial:   less than 180 mg/dL (1-2 hours)      Critically ill patients:  140 - 180 mg/dL     Results for [name], [name] (MRN [id number]) as of 08/14/2013 09:27  Ref. Range 08/13/2013 08:10 08/13/2013 12:14 08/13/2013 16:36 08/13/2013 19:46  Glucose-Capillary Latest Range: 70-99 mg/dL 016 (H) 553 (H) 748 (H) 199 (H)    Results for [name], [name] (MRN [id number]) as of 08/14/2013 09:27  Ref. Range 08/14/2013 08:12  Glucose-Capillary Latest Range: 70-99 mg/dL 492 (H)     Admitted for Aortobifemoral bypass.  POD#3 today.  Patient has history of DM, HTN, PVD.  Home DM Meds: 70/30 insulin- 60 units daily in the AM   **Patient currently NPO.  POD#3.  Had some confusion last night but is A&O this AM.  **CBGs elevated, especially in the AM.  Needs some basal insulin while NPO and then once patient is eating well, can be switched back to his home 70/30 insulin   MD- Recommend the following:  1. Start Lantus 40 units QAM 2. Once patient eating well, please stop Lantus and restart patient's home dose of 70/30 insulin   Will follow Steven Finland RN, MSN, CDE Diabetes Coordinator Inpatient Diabetes Program Team Pager: [phone])   Addendum [phone]:  Spoke with Steven Massed, PA with Vascular.  Got telephone orders to start Lantus 40 units QAM start today.  Can restart 70/30 insulin once patient eating well on PO diet.  Orders entered into [facility].

## 2013-08-14 NOTE — Care Management Note (Addendum)
    Page 1 of 2   08/16/2013     4:28:47 PM CARE MANAGEMENT NOTE 08/16/2013  Patient:  Steven Sanders, Steven Sanders   Account Number:  [id number]  Date Initiated:  08/14/2013  Documentation initiated by:  Donn Pierini  Subjective/Objective Assessment:   Pt admitted s/p Aorto-BiFemoral Bypass     Action/Plan:   PTA pt lived at home alone- PT/OT evals ordered-   Anticipated DC Date:     Anticipated DC Plan:  HOME W HOME HEALTH SERVICES      DC Planning Services  CM consult      [facility] Choice  HOME HEALTH   Choice offered to / List presented to:  C-1 Patient   DME arranged  BEDSIDE COMMODE  [facility] - ROLLING      DME agency  [facility]     HH arranged  HH-2 PT  HH-3 OT      [facility] agency  [facility]   Status of service:  Completed, signed off Medicare Important Message given?  YES (If response is "NO", the following Medicare IM given date fields will be blank) Date Medicare IM given:  08/16/2013 Date Additional Medicare IM given:    Discharge Disposition:  HOME W HOME HEALTH SERVICES  Per UR Regulation:  Reviewed for med. necessity/level of care/duration of stay  If discussed at Long Length of Stay Meetings, dates discussed:    Comments:  08/16/13 Nickie [place], [place] [phone] Pt discharging home today with spouse.  Needs HH follow up; referral to [facility], per pt choice.  Start of care 24-48h post dc date.  Pt needs RW and 3 in 1 for home use; referral faxed to [place], per insurance DME contract.  Pt requests DME be delivered to his home, rather than wait on it here. [employer] aware, and will deliver to home.  08/14/13- 1600- Donn Pierini RN, BSN [phone] Spoke with pt at bedside regarding potential d/c needs and PT recommendations- Per conversation pt states that he lives alone and does not have anyone that can assist him at discharge. He however does not want to go to [facility] and wishes to return home at discharge- he understands that insurance will not cover someone to come  stay with him or offer ADL assistance at home- he is agreeable to [facility] services for RN/PT/Aide- he has cane at home- but would potentially need RW and 3n1 (he wants to see how he progresses prior to discharge) NCM to f/u with pt prior to discharge for [facility] needs and DME.

## 2013-08-14 NOTE — Progress Notes (Signed)
  [facility] Progress Note  08/14/2013 7:18 AM 3 Days Post-Op  Subjective:  Sates he has almost no pain.  Some nausea this am after pulling NGT out 2x yesterday.  Wants a diet coke. -Per RN-pt confused last pm  Afebrile x 24 hrs HR 90's-110's SVT 140's-160's systolic 98% RA  Filed Vitals:   08/14/13 0344  BP: 144/56  Pulse: 109  Temp: 98 F (36.7 C)  Resp: 18    Physical Exam: Cardiac:  Regular/tachy Lungs:  CTAB Abdomen:  Soft, NT, slightly distended, increased BS this morning; still no flatus Incisions:  C/d/i with echhymosis Extremities:  Bilateral feet are warm. Neuro:  Alert and oriented x 3 to place, date and year.  CBC    Component Value Date/Time   WBC 20.8* 08/14/2013 0430   RBC 3.54* 08/14/2013 0430   HGB 9.8* 08/14/2013 0430   HCT 30.7* 08/14/2013 0430   PLT 197 08/14/2013 0430   MCV 86.7 08/14/2013 0430   MCH 27.7 08/14/2013 0430   MCHC 31.9 08/14/2013 0430   RDW 16.3* 08/14/2013 0430    BMET    Component Value Date/Time   NA 144 08/14/2013 0430   K 4.8 08/14/2013 0430   CL 106 08/14/2013 0430   CO2 11* 08/14/2013 0430   GLUCOSE 309* 08/14/2013 0430   BUN 16 08/14/2013 0430   CREATININE 0.66 08/14/2013 0430   CREATININE 0.81 05/26/2013 1440   CALCIUM 8.8 08/14/2013 0430   GFRNONAA >90 08/14/2013 0430   GFRAA >90 08/14/2013 0430    INR    Component Value Date/Time   INR 1.16 08/11/2013 1215     Intake/Output Summary (Last 24 hours) at 08/14/13 0718 Last data filed at 08/14/13 0600  Gross per 24 hour  Intake   2275 ml  Output   3050 ml  Net   -775 ml     Assessment/Plan:  73 y.o. male is s/p  Aorto-BiFemoral Bypass with 14x8 darcyon graft 3 Days Post-Op  -pt pulled NGT x 2 yesterday-order left to leave out after 2nd time.  He has had some nausea this am, but no vomiting.  BS are increased this morning from yesterday.  Continue NPO. -WBC still elevated at 20.8k-u/a is negative and CXR with some mild atelectasis and pt is afebrile.  Will order  blood cx x 2.  May need to get central line out and use peripheral IV. -continue IS every hour -continue to mobilize pt today-needs to ambulate in halls and OOB to chair tid. -still hyperglycemic-DM coordinator consult in and should see today-HgbA1c is 7.3 -some confusion last pm per RN, but pt is A&O this am. -restart home meds when taking po's.   Doreatha Massed, PA-C [facility] [phone] 08/14/2013 7:18 AM  Incisions look good.  Palpable femoral pulses. Keep NPO, given nausea Needs to ambulate.  Ordered PT consult   Wells brabham

## 2013-08-14 NOTE — Progress Notes (Signed)
Utilization review completed.  

## 2013-08-14 NOTE — Progress Notes (Signed)
Physical Therapy Treatment Patient Details Name: EDWARDO TULEY MRN: [id number] DOB: [birth year] Today's Date: 08/14/2013    History of Present Illness Pt admit for aortobifemoral BPG.      PT Comments    Patient ambulated in hall with 2 standing rest breaks, some generalized fatigue but improved stability. Patient HR did elevate to 120s with activity. Ambulated on 4 liters. Spoke with patient about mobility expectations as well as home management with assistive device.    Follow Up Recommendations  Home health PT;Supervision/Assistance - 24 hour     Equipment Recommendations  Rolling walker with 5" wheels;3in1 (PT)    Recommendations for Other Services       Precautions / Restrictions Precautions Precautions: Fall Restrictions Weight Bearing Restrictions: No    Mobility  Bed Mobility Overal bed mobility: Needs Assistance Bed Mobility: Supine to Sit     Supine to sit: Min guard;HOB elevated        Transfers Overall transfer level: Needs assistance Equipment used: Rolling walker (2 wheeled) Transfers: Sit to/from Stand Sit to Stand: Min assist            Ambulation/Gait Ambulation/Gait assistance: Supervision;Min guard Ambulation Distance (Feet): 310 Feet Assistive device: Rolling walker (2 wheeled) Gait Pattern/deviations: Step-through pattern;Decreased stride length;Narrow base of support Gait velocity: decreased Gait velocity interpretation: Below normal speed for age/gender     Stairs            Wheelchair Mobility    Modified Rankin (Stroke Patients Only)       Balance Overall balance assessment: Needs assistance Sitting-balance support: Bilateral upper extremity supported;Feet supported Sitting balance-Leahy Scale: Fair     Standing balance support: During functional activity;Single extremity supported Standing balance-Leahy Scale: Fair                      Cognition Arousal/Alertness: Awake/alert Behavior During  Therapy: WFL for tasks assessed/performed Overall Cognitive Status: Within Functional Limits for tasks assessed Area of Impairment: Memory;Safety/judgement;Following commands;Awareness;Problem solving     Memory: Decreased short-term memory Following Commands: Follows multi-step commands inconsistently Safety/Judgement: Decreased awareness of safety;Decreased awareness of deficits Awareness: Anticipatory Problem Solving: Slow processing;Decreased initiation General Comments: Pt demonstrates deficits with following multi step commands. Pt demonstrates decr sequence with adls. pt needed cues for safety. pt unaware of current location within [place] and educated.     Exercises General Exercises - Lower Extremity Ankle Circles/Pumps: AROM;5 reps;Both;Seated Long Arc Quad: AROM;Both;5 reps;Seated    General Comments        Pertinent Vitals/Pain SpO2 unable to read accurately during activity, 98% on 4 liters at rest, pt reports no pain at this time    Home Living Family/patient expects to be discharged to:: Private residence Living Arrangements: Alone Available Help at Discharge: Friend(s);Family;Available 24 hours/day Type of Home: House Home Access: Stairs to enter Entrance Stairs-Rails: None Home Layout: Multi-level Home Equipment: [place] Mor - single point      Prior Function Level of Independence: Independent with assistive device(s)      Comments: Used cane for last week or so   PT Goals (current goals can now be found in the care plan section) Acute Rehab PT Goals Patient Stated Goal: to go home PT Goal Formulation: With patient Time For Goal Achievement: 08/19/13 Potential to Achieve Goals: Good Progress towards PT goals: Progressing toward goals    Frequency  Min 3X/week    PT Plan Current plan remains appropriate    Co-evaluation  End of Session Equipment Utilized During Treatment: Gait belt;Oxygen Activity Tolerance: Patient limited by  fatigue Patient left: in chair;with call bell/phone within reach;with family/visitor present     Time: 1200-1225 PT Time Calculation (min): 25 min  Charges:  $Gait Training: 8-22 mins $Therapeutic Activity: 8-22 mins                    G Codes:      Fabio AsaDevon J Sabrina Arriaga 08/14/2013, 12:29 PM Charlotte Crumbevon Chaise Mahabir, PT DPT  [phone]

## 2013-08-14 NOTE — Evaluation (Signed)
Occupational Therapy Evaluation Patient Details Name: Steven Sanders MRN: [id number] DOB: [birth year] Today's Date: 08/14/2013    History of Present Illness Pt admit for aortobifemoral BPG.     Clinical Impression   PT admitted with aortobifemoral BPG surg. Pt currently with functional limitiations due to the deficits listed below (see OT problem list). PTA independent at home alone. Pt will benefit from skilled OT to increase their independence and safety with adls and balance to allow discharge HHOT with 24/7 initial assistance. Pt currently demonstrates executive cognitive deficits.     Follow Up Recommendations  Home health OT;Supervision/Assistance - 24 hour    Equipment Recommendations  None recommended by OT    Recommendations for Other Services       Precautions / Restrictions Precautions Precautions: Fall      Mobility Bed Mobility Overal bed mobility: Needs Assistance Bed Mobility: Supine to Sit     Supine to sit: Min guard;HOB elevated        Transfers Overall transfer level: Needs assistance Equipment used: 1 person hand held assist Transfers: Sit to/from Stand Sit to Stand: Min assist              Balance Overall balance assessment: Needs assistance Sitting-balance support: Bilateral upper extremity supported;Feet supported Sitting balance-Leahy Scale: Fair     Standing balance support: During functional activity;Single extremity supported Standing balance-Leahy Scale: Fair                              ADL Overall ADL's : Needs assistance/impaired Eating/Feeding: NPO   Grooming: Wash/dry face;Oral care;Sitting;Minimal assistance Grooming Details (indicate cue type and reason): cues for sequence and safety. Pt required seated position due to HR elevated adn increasing                  Toilet Transfer: Min guard;Ambulation;BSC           Functional mobility during ADLs: Minimal assistance;Cueing for  safety General ADL Comments: Pt entering bathroom repeating command "go to the sink" pt attempting to enter shower and then looking to the left for sink. pt cued to sink location several times and needs physical assistance to locate sink. pt static standign initially for oral care. pt needed cues for sequence. pt seated on 3n1 due to HR reaching 138 sustained. pt asking for wash cloth after using wash cloth once during session. pt with no recall of wash cloth present on sink surface. Pt slightly impulisve and needs cues to stop during session     Vision                     Perception     Praxis      Pertinent Vitals/Pain HR 138 during session sustained- required seated position to decr HR irregular at end of session 98-115 Oxygen 4 L during session     Hand Dominance Right   Extremity/Trunk Assessment Upper Extremity Assessment Upper Extremity Assessment: Overall WFL for tasks assessed   Lower Extremity Assessment Lower Extremity Assessment: Defer to PT evaluation   Cervical / Trunk Assessment Cervical / Trunk Assessment: Normal   Communication Communication Communication: No difficulties   Cognition Arousal/Alertness: Awake/alert Behavior During Therapy: Impulsive Overall Cognitive Status: Impaired/Different from baseline Area of Impairment: Memory;Safety/judgement;Following commands;Awareness;Problem solving     Memory: Decreased short-term memory Following Commands: Follows multi-step commands inconsistently Safety/Judgement: Decreased awareness of safety;Decreased awareness of deficits Awareness: Anticipatory Problem Solving: Slow processing;Decreased initiation  General Comments: Pt demonstrates deficits with following multi step commands. Pt demonstrates decr sequence with adls. pt needed cues for safety. pt unaware of current location within [place] and educated.    General Comments       Exercises       Shoulder Instructions      Home Living  Family/patient expects to be discharged to:: Private residence Living Arrangements: Alone Available Help at Discharge: Friend(s);Family;Available 24 hours/day Type of Home: House Home Access: Stairs to enter [employer] of Steps: 1 Entrance Stairs-Rails: None Home Layout: Multi-level Alternate Level Stairs-Number of Steps: 1 Alternate Level Stairs-Rails: None Bathroom Shower/Tub: Producer, television/film/video: Standard     Home Equipment: Cane - single point          Prior Functioning/Environment Level of Independence: Independent with assistive device(s)        Comments: Used cane for last week or so    OT Diagnosis: Generalized weakness;Cognitive deficits;Acute pain   OT Problem List: Decreased strength;Decreased activity tolerance;Impaired balance (sitting and/or standing);Decreased cognition;Decreased safety awareness;Decreased knowledge of use of DME or AE;Decreased knowledge of precautions;Cardiopulmonary status limiting activity;Pain;Impaired UE functional use   OT Treatment/Interventions: Self-care/ADL training;DME and/or AE instruction;Therapeutic activities;Cognitive remediation/compensation;Patient/family education;Balance training    OT Goals(Current goals can be found in the care plan section) Acute Rehab OT Goals Patient Stated Goal: to go home OT Goal Formulation: With patient/family Time For Goal Achievement: 08/28/13 Potential to Achieve Goals: Good  OT Frequency: Min 2X/week   Barriers to D/C:            Co-evaluation              End of Session Nurse Communication: Mobility status;Precautions  Activity Tolerance: Patient tolerated treatment well Patient left: in chair;with call bell/phone within reach;with family/visitor present   Time: 2585-2778 OT Time Calculation (min): 22 min Charges:  OT General Charges $OT Visit: 1 Procedure OT Evaluation $Initial OT Evaluation Tier I: 1 Procedure OT Treatments $Self Care/Home  Management : 8-22 mins G-Codes:    Steven Sanders 09/10/13, 10:17 AM Pager: [phone]

## 2013-08-15 LAB — BASIC METABOLIC PANEL
BUN: 19 mg/dL (ref 6–23)
CO2: 22 mEq/L (ref 19–32)
CREATININE: 0.62 mg/dL (ref 0.50–1.35)
Calcium: 8.9 mg/dL (ref 8.4–10.5)
Chloride: 119 mEq/L — ABNORMAL HIGH (ref 96–112)
GFR calc non Af Amer: >90 mL/min (ref >90)
Glucose, Bld: 194 mg/dL — ABNORMAL HIGH (ref 70–99)
Potassium: 3.4 mEq/L — ABNORMAL LOW (ref 3.7–5.3)
Sodium: 154 mEq/L — ABNORMAL HIGH (ref 137–147)

## 2013-08-15 LAB — GLUCOSE, CAPILLARY
GLUCOSE-CAPILLARY: 209 mg/dL — AB (ref 70–99)
GLUCOSE-CAPILLARY: 56 mg/dL — AB (ref 70–99)
Glucose-Capillary: 181 mg/dL — ABNORMAL HIGH (ref 70–99)
Glucose-Capillary: 154 mg/dL — ABNORMAL HIGH (ref 70–99)
Glucose-Capillary: 80 mg/dL (ref 70–99)

## 2013-08-15 MED ORDER — GLUCOSE 40 % PO GEL
ORAL | Status: AC
  Administered 2013-08-15: 37.5 g
  Filled 2013-08-15: qty 1

## 2013-08-15 NOTE — Progress Notes (Signed)
Occupational Therapy Treatment Patient Details Name: Steven Sanders MRN: [id number] DOB: 02/20/1941 Today's Date: 08/15/2013    History of present illness Pt admit for aortobifemoral BPG.     OT comments  Making excellent progress. Rec pt have 3 in 1 for D/C to use in shower to reduce risk of falls. Pt in agreement. Pt ready for D/C home when medically stable. All further OT needs to be addressed by [place].  Follow Up Recommendations  Home health OT;Supervision/Assistance - 24 hour (initial 24/7 S)    Equipment Recommendations  3 in 1 bedside comode    Recommendations for Other Services      Precautions / Restrictions Precautions Precautions: Fall Restrictions Weight Bearing Restrictions: No       Mobility Bed Mobility Overal bed mobility: Modified Independent Bed Mobility: Supine to Sit     Supine to sit: Min guard;HOB elevated        Transfers Overall transfer level: Modified independent Equipment used: Rolling walker (2 wheeled) Transfers: Sit to/from Stand Sit to Stand: Min guard         General transfer comment: HOB elevated    Balance     Sitting balance-Leahy Scale: Good       Standing balance-Leahy Scale: Fair                     ADL Overall ADL's : Needs assistance/impaired                     Lower Body Dressing: Supervision/safety   Toilet Transfer: Modified Independent;Ambulation   Toileting- Clothing Manipulation and Hygiene: Modified independent   Tub/ Shower Transfer: Supervision/safety   Functional mobility during ADLs: Modified independent General ADL Comments: Pt with improved performance today. Rec pt use reacher as needed for ADL and IADL tasks. rec use of 3 in 1 for shower to reduce risk of falls . Pt in agreement.      Vision                     Perception     Praxis      Cognition   Behavior During Therapy: WFL for tasks assessed/performed Overall Cognitive Status: Within Functional  Limits for tasks assessed                       Extremity/Trunk Assessment               Exercises General Exercises - Lower Extremity Ankle Circles/Pumps: AROM;5 reps;Both;Seated   Shoulder Instructions       General Comments      Pertinent Vitals/ Pain       VSS; no c/o pain  Home Living                                          Prior Functioning/Environment              Frequency       Progress Toward Goals  OT Goals(current goals can now be found in the care plan section)  Progress towards OT goals: Goals met/education completed, patient discharged from OT  Acute Rehab OT Goals Patient Stated Goal: to go home OT Goal Formulation: With patient/family Time For Goal Achievement: 08/28/13 Potential to Achieve Goals: Good ADL Goals Pt Will Perform Grooming: with supervision;standing Pt Will Perform Upper Body Bathing: with  supervision;standing;sitting Pt Will Perform Lower Body Bathing: with supervision;sit to/from stand Pt Will Transfer to Toilet: with supervision;bedside commode  Plan Discharge plan remains appropriate;All goals met and education completed, patient discharged from OT services (all further OT to be addressed by [facility])    Co-evaluation                 End of Session     Activity Tolerance Patient tolerated treatment well   Patient Left in bed;with call bell/phone within reach   Nurse Communication Mobility status        Time: 8588-5027 OT Time Calculation (min): 15 min  Charges: OT General Charges $OT Visit: 1 Procedure OT Treatments $Self Care/Home Management : 8-22 mins  Roney Jaffe Delmy Holdren 08/15/2013, 2:07 PM   [facility], OTR/L  [phone] 08/15/2013

## 2013-08-15 NOTE — Progress Notes (Signed)
Report received from [place] of [address], awaiting patients transfer.   Reita Cliche 08/15/2013 11:09 AM

## 2013-08-15 NOTE — Progress Notes (Signed)
Patient arrived to unit from [address].  Patient alert, oriented, verbally responsive, breathing regular and non-labored throughout, no s/s of distress noted throughout, no c/o pain throughout.  Patient oriented to unit and room, needs attended to.  VS WNL.  Will continue to monitor.  Reita Cliche 08/15/2013

## 2013-08-15 NOTE — Progress Notes (Addendum)
Hypoglycemic Event  CBG: 56  Treatment: glucose gel  Symptoms:n/a  Follow-up CBG: Time:2130 CBG Result:80  Possible Reasons for Event: poor intake  Comments/MD notified: no    Sharmon Leyden  Remember to initiate Hypoglycemia Order Set & complete

## 2013-08-15 NOTE — Progress Notes (Signed)
Report called, pt transferred to 2[facility]. No s/s of acute distress noted.

## 2013-08-15 NOTE — Progress Notes (Signed)
  [facility] Progress Note  08/15/2013 7:33 AM 4 Days Post-Op  Subjective:  Patient doing well this am. No nausea or vomiting. Has been ambulating. +flatus, + BM Denies fever, chills, chest pain, shortness of breath, pain with urination.   Tmax 99.1 BP sys 150s-160s 02 99% RA  Filed Vitals:   08/15/13 0435  BP: 156/63  Pulse: 88  Temp: 99.1 F (37.3 C)  Resp: 18    Physical Exam: Cardiac:  RRR, no m/g/r  Lungs:  CTAB Abdomen:  Slightly distended, +BS, non tender, ecchymosis around midline abdominal incision Incisions:  Midline and groin incisions c/d/i, ecchymosis around groin incisions. Extremities: doppler pulses DP/PT b/l; warm to touch.    CBC    Component Value Date/Time   WBC 20.8* 08/14/2013 0430   RBC 3.54* 08/14/2013 0430   HGB 9.8* 08/14/2013 0430   HCT 30.7* 08/14/2013 0430   PLT 197 08/14/2013 0430   MCV 86.7 08/14/2013 0430   MCH 27.7 08/14/2013 0430   MCHC 31.9 08/14/2013 0430   RDW 16.3* 08/14/2013 0430    BMET    Component Value Date/Time   NA 144 08/14/2013 0430   K 4.8 08/14/2013 0430   CL 106 08/14/2013 0430   CO2 11* 08/14/2013 0430   GLUCOSE 309* 08/14/2013 0430   BUN 16 08/14/2013 0430   CREATININE 0.66 08/14/2013 0430   CREATININE 0.81 05/26/2013 1440   CALCIUM 8.8 08/14/2013 0430   GFRNONAA >90 08/14/2013 0430   GFRAA >90 08/14/2013 0430    INR    Component Value Date/Time   INR 1.16 08/11/2013 1215     Intake/Output Summary (Last 24 hours) at 08/15/13 0733 Last data filed at 08/15/13 0630  Gross per 24 hour  Intake   1800 ml  Output   1500 ml  Net    300 ml     Assessment/Plan:  73 y.o. male is s/p  Aorto-BiFemoral Bypass with 14x8 darcyon graft  4 Days Post-Op -Nausea has been improving, no vomiting, had small BM last night. Will ask Dr. Myra Gianotti about advancing diet to clear liquids. -Blood cultures pending. Leukocytosis yesterday 20.8, u/a negative, CXR mild atelectasis, patient afebrile. Will reorder labs in AM.  -DM: diabetic  coordinator has seen, recommending lantus 40 units q am -Has been ambulating, continue PT -Continue IS -DVT prophylaxis: Lovenox -Transfer to the floor.    [name], PA-C [facility] Office: [phone] Pager: [phone] 08/15/2013 7:33 AM  Addendum Saw patient this afternoon at 1600. Patient handling clears well. No n/v.    I agree with the above.  The patient looks excellent today.  He is passing gas and has had 2 small formed bowel movements.  He tolerated clear liquids.  He is been and ablating in the halls.  I will plan on advancing his diet in the morning.  We will recheck his blood work to make sure his leukocytosis has resolved, in addition to his electrolyte abnormalities.  If these are improved, the patient could potentially be discharged home tomorrow.  Durene Cal

## 2013-08-15 NOTE — Progress Notes (Signed)
Physical Therapy Treatment Patient Details Name: DENG MCNEFF MRN: [id number] DOB: Apr 10, 1940 Today's Date: 09-12-2013    History of Present Illness Pt admit for aortobifemoral BPG.      PT Comments    Patient ambulated increased distance today. Able to ambulate without RW. Tolerated well. Will continue to progress as tolerated.  Follow Up Recommendations  Home health PT;Supervision/Assistance - 24 hour     Equipment Recommendations  Rolling walker with 5" wheels;3in1 (PT)    Recommendations for Other Services       Precautions / Restrictions Precautions Precautions: Fall Restrictions Weight Bearing Restrictions: No    Mobility  Bed Mobility Overal bed mobility: Needs Assistance Bed Mobility: Supine to Sit     Supine to sit: Min guard;HOB elevated        Transfers Overall transfer level: Needs assistance Equipment used: Rolling walker (2 wheeled) Transfers: Sit to/from Stand Sit to Stand: Min guard         General transfer comment: HOB elevated  Ambulation/Gait Ambulation/Gait assistance: Supervision Ambulation Distance (Feet): 610 Feet Assistive device: None Gait Pattern/deviations: Step-through pattern;Decreased stride length;Narrow base of support Gait velocity: decreased Gait velocity interpretation: Below normal speed for age/gender     Stairs            Wheelchair Mobility    Modified Rankin (Stroke Patients Only)       Balance     Sitting balance-Leahy Scale: Good       Standing balance-Leahy Scale: Fair                      Cognition Arousal/Alertness: Awake/alert Behavior During Therapy: WFL for tasks assessed/performed Overall Cognitive Status: Within Functional Limits for tasks assessed                      Exercises General Exercises - Lower Extremity Ankle Circles/Pumps: AROM;5 reps;Both;Seated    General Comments        Pertinent Vitals/Pain NAD, No pain, VSS    Home Living                       Prior Function            PT Goals (current goals can now be found in the care plan section) Acute Rehab PT Goals Patient Stated Goal: to go home PT Goal Formulation: With patient Time For Goal Achievement: 08/19/13 Potential to Achieve Goals: Good Progress towards PT goals: Progressing toward goals    Frequency  Min 3X/week    PT Plan Current plan remains appropriate    Co-evaluation             End of Session Equipment Utilized During Treatment: Gait belt;Oxygen Activity Tolerance: Patient limited by fatigue Patient left: in chair;with call bell/phone within reach;with family/visitor present     Time: 1210-1228 PT Time Calculation (min): 18 min  Charges:  $Gait Training: 8-22 mins                    G Codes:      Fabio Asa 09/12/2013, 12:53 PM Charlotte Crumb, PT DPT  [phone]

## 2013-08-15 NOTE — Progress Notes (Signed)
Inpatient Diabetes Program Recommendations  AACE/ADA: New Consensus Statement on Inpatient Glycemic Control (2013)  Target Ranges:  Prepandial:   less than 140 mg/dL      Peak postprandial:   less than 180 mg/dL (1-2 hours)      Critically ill patients:  140 - 180 mg/dL     Results for Steven Sanders, Steven Sanders (MRN [id number]) as of 08/15/2013 07:49  Ref. Range 08/14/2013 08:12 08/14/2013 11:39 08/14/2013 16:44 08/14/2013 21:32  Glucose-Capillary Latest Range: 70-99 mg/dL 062 (H) 376 (H) 283 (H) 114 (H)     Spoke with PA yesterday regarding [name]' CBGs.  Decision made to start Lantus 40 units daily until patient resumes normal solid, PO diet.    Note that patient may start CL diet today.  Would continue Lantus 40 units daily until patient resumes solid, PO diet.  Once patient eating well, can d/c Lantus and start patient's home dose of 70/30 insulin.   Will follow [name] RN, MSN, CDE Diabetes Coordinator Inpatient Diabetes Program Team Pager: [phone] (8a-10p)

## 2013-08-16 ENCOUNTER — Telehealth: Payer: Self-pay | Admitting: Surgery

## 2013-08-16 LAB — BASIC METABOLIC PANEL
BUN: 14 mg/dL (ref 6–23)
CALCIUM: 8.4 mg/dL (ref 8.4–10.5)
CO2: 25 mEq/L (ref 19–32)
Chloride: 111 mEq/L (ref 96–112)
Creatinine, Ser: 0.55 mg/dL (ref 0.50–1.35)
GFR calc Af Amer: >90 mL/min (ref >90)
Glucose, Bld: 81 mg/dL (ref 70–99)
POTASSIUM: 3.1 meq/L — AB (ref 3.7–5.3)
SODIUM: 148 meq/L — AB (ref 137–147)

## 2013-08-16 LAB — CBC
HCT: 28.7 % — ABNORMAL LOW (ref 39.0–52.0)
Hemoglobin: 9.6 g/dL — ABNORMAL LOW (ref 13.0–17.0)
MCH: 27.8 pg (ref 26.0–34.0)
MCHC: 33.4 g/dL (ref 30.0–36.0)
MCV: 83.2 fL (ref 78.0–100.0)
Platelets: 219 10*3/uL (ref 150–400)
RBC: 3.45 MIL/uL — ABNORMAL LOW (ref 4.22–5.81)
RDW: 15.8 % — AB (ref 11.5–15.5)
WBC: 11.2 10*3/uL — ABNORMAL HIGH (ref 4.0–10.5)

## 2013-08-16 LAB — GLUCOSE, CAPILLARY
GLUCOSE-CAPILLARY: 126 mg/dL — AB (ref 70–99)
Glucose-Capillary: 82 mg/dL (ref 70–99)

## 2013-08-16 MED ORDER — OXYCODONE HCL 5 MG PO TABS: 5.0000 mg | ORAL_TABLET | Freq: Four times a day (QID) | ORAL | Status: DC | PRN

## 2013-08-16 MED ORDER — POTASSIUM CHLORIDE CRYS ER 20 MEQ PO TBCR
20.0000 meq | EXTENDED_RELEASE_TABLET | Freq: Two times a day (BID) | ORAL | Status: DC
  Administered 2013-08-16: 20 meq via ORAL
  Filled 2013-08-16: qty 1

## 2013-08-16 NOTE — Discharge Summary (Signed)
[facility] Discharge Summary  Steven Sanders Sep 30, 1940 73 y.o. male  [id number]  Admission Date: 08/11/2013  Discharge Date: 08/16/13  Physician: Myra Gianotti  Admission Diagnosis: Peripheral Vascular Disease with Bilateral Claudication   HPI:   This is a 73 y.o. male with a history of bilateral claudication. He has been followed for his claudication by Dr. Myra Gianotti since 2010. He has been managed medically. He has had significant limitations with walking. His has been on pletal with some relief. His symptoms have progressed to where he would like to proceed with aortobifemoral bypass graft.   Hospital Course:  The patient was admitted to the hospital and taken to the operating room on 08/11/2013 and underwent: aortobifemoral bypass graft. The pt tolerated the procedure well and was transported to the PACU in good condition.   On POD 1, he had palpable R PT pulse and dopplerable L DP. He was transferred to a stepdown unit.  On POD 2, he was not passing flatus with decreased bowel sounds. He had increased output from his NG tube. His WBC was 20.5 and he was afebrile.  A urine analysis and chest x-ray was ordered.   On POD 3, his NG tube was accidentally pulled out by the patient during repositioning. Attempts to replace the tub were unsuccessful and the NG tube was left out. He had little pain and had some mild nausea but no vomiting. His bowel sounds increased today.  His urine analysis was negative and his chest x-ray showed mild atelectasis. His femoral pulses were palpable.   On POD 4, he denied any nausea and vomiting. He was passing flatus and had a bowel movement. His diet was advanced to clear liquids. He was transferred to the floor. He has been ambulating well.   On POD 5, his WBC had trended down to 11.2. He continued to remain afebrile. He was able to tolerate clear liquids and his diet was advanced to carb modified. He had continued to ambulate well. He was  recommended to go home with home health. He was discharged on POD 5 with home health.   The remainder of the hospital course consisted of increasing mobilization and increasing intake of solids without difficulty.  CBC    Component Value Date/Time   WBC 11.2* 08/16/2013 0448   RBC 3.45* 08/16/2013 0448   HGB 9.6* 08/16/2013 0448   HCT 28.7* 08/16/2013 0448   PLT 219 08/16/2013 0448   MCV 83.2 08/16/2013 0448   MCH 27.8 08/16/2013 0448   MCHC 33.4 08/16/2013 0448   RDW 15.8* 08/16/2013 0448    BMET    Component Value Date/Time   NA 148* 08/16/2013 0448   K 3.1* 08/16/2013 0448   CL 111 08/16/2013 0448   CO2 25 08/16/2013 0448   GLUCOSE 81 08/16/2013 0448   BUN 14 08/16/2013 0448   CREATININE 0.55 08/16/2013 0448   CREATININE 0.81 05/26/2013 1440   CALCIUM 8.4 08/16/2013 0448   GFRNONAA >90 08/16/2013 0448   GFRAA >90 08/16/2013 0448     Discharge Instructions:   The patient is discharged to home with extensive instructions on wound care and progressive ambulation.  They are instructed not to drive or perform any heavy lifting until returning to see the physician in his office.  Discharge Instructions   ABDOMINAL PROCEDURE/ANEURYSM REPAIR/AORTO-BIFEMORAL BYPASS:  Call MD for increased abdominal pain; cramping diarrhea; nausea/vomiting    Complete by:  As directed      Call MD for:  redness, tenderness,  or signs of infection (pain, swelling, bleeding, redness, odor or green/yellow discharge around incision site)    Complete by:  As directed      Call MD for:  severe or increased pain, loss or decreased feeling  in affected limb(s)    Complete by:  As directed      Call MD for:  temperature >100.5    Complete by:  As directed      Driving Restrictions    Complete by:  As directed   No driving while on pain medication     Increase activity slowly    Complete by:  As directed   Walk with assistance use walker or cane as needed     Lifting restrictions    Complete by:  As directed   No lifting for 8 weeks      Resume previous diet    Complete by:  As directed      may wash over wound with mild soap and water    Complete by:  As directed            Discharge Diagnosis:  Peripheral Vascular Disease with Bilateral Claudication  Secondary Diagnosis: Patient Active Problem List   Diagnosis Date Noted  . Aorto-iliac disease 08/11/2013  . Atherosclerosis of native arteries of the extremities with ulceration(440.23) 01/23/2013  . Peripheral vascular disease, unspecified 12/12/2012  . Atherosclerosis of native arteries of the extremities with intermittent claudication 10/05/2011   Past Medical History  Diagnosis Date  . Hypercholesterolemia   . Diabetes mellitus   . Peripheral vascular disease   . Hypertension   . COPD (chronic obstructive pulmonary disease)     stopped smoking 5 years ago  . GERD (gastroesophageal reflux disease)        Medication List         aspirin 81 MG tablet  Take 81 mg by mouth daily.     cilostazol 50 MG tablet  Commonly known as:  PLETAL  Take 50 mg by mouth 2 (two) times daily.     lisinopril 5 MG tablet  Commonly known as:  PRINIVIL,ZESTRIL  Take 5 mg by mouth daily.     NOVOLIN 70/30 (70-30) 100 UNIT/ML injection  Generic drug:  insulin NPH-regular Human  Inject 60 Units into the skin daily.     omeprazole 20 MG capsule  Commonly known as:  PRILOSEC  Take 20 mg by mouth daily.     oxyCODONE 5 MG immediate release tablet  Commonly known as:  ROXICODONE  Take 1 tablet (5 mg total) by mouth every 6 (six) hours as needed for severe pain.     rosuvastatin 40 MG tablet  Commonly known as:  CRESTOR  Take 40 mg by mouth daily.        Roxicodone #30 No Refill  Disposition: Home with home health  Patient's condition: is Good  Follow up: 1. Dr. Myra GianottiBrabham in 2 weeks   Maris BergerKimberly Dayona Shaheen, PA-C [facility] [phone] 08/16/2013  4:14 PM

## 2013-08-16 NOTE — Progress Notes (Signed)
08/16/13  1431  Reviewed discharge instructions with patient.  Patient verbalized understanding of discharge instructions. Copy of discharge instructions and prescription given to patient.

## 2013-08-16 NOTE — Telephone Encounter (Addendum)
Message copied by Rosalyn Charters on Wed Aug 16, 2013  9:33 AM ------      Message from: Lorin Mercy K      Created: Wed Aug 16, 2013  9:19 AM      Regarding: Schedule                   ----- Message -----         From: Raymond Gurney, PA-C         Sent: 08/16/2013   8:39 AM           To: [facility] Charge Pool            S/p aortobifemoral bypass 08/11/13            F/u with Dr. Myra Gianotti in 2 weeks            Thanks,            Selena Batten ------  notified patient of post op appt. with dr. Myra Gianotti on 09-04-13 at 8:30 am

## 2013-08-16 NOTE — Progress Notes (Signed)
  [facility] Progress Note  08/16/2013 7:35 AM 5 Days Post-Op  Subjective:  Doing well this am. Has been ambulating. Tolerating clear liquids. Wants to eat regular food. Denies nausea and vomiting. Passing flatus and had small bowel movement last night.   Tmax 98.7 BP sys 140s-150s  02 100% RA  Filed Vitals:   08/16/13 0604  BP: 143/82  Pulse: 80  Temp: 98.2 F (36.8 C)  Resp: 18    Physical Exam: Cardiac:  RRR, no m/g/r Lungs:  CTAB Abdomen:  +BS, very mildly distended, non-tender, midline incision c/d/i with some ecchymosis.  Ecchymosis of lower lateral back bilaterally.  Incisions:  Bilateral groin incisions c/d/i with ecchymosis. Extremities: +Doppler signals DP/PT bilaterally.  Feet warm to touch.   CBC    Component Value Date/Time   WBC 11.2* 08/16/2013 0448   RBC 3.45* 08/16/2013 0448   HGB 9.6* 08/16/2013 0448   HCT 28.7* 08/16/2013 0448   PLT 219 08/16/2013 0448   MCV 83.2 08/16/2013 0448   MCH 27.8 08/16/2013 0448   MCHC 33.4 08/16/2013 0448   RDW 15.8* 08/16/2013 0448    BMET    Component Value Date/Time   NA 148* 08/16/2013 0448   K 3.1* 08/16/2013 0448   CL 111 08/16/2013 0448   CO2 25 08/16/2013 0448   GLUCOSE 81 08/16/2013 0448   BUN 14 08/16/2013 0448   CREATININE 0.55 08/16/2013 0448   CREATININE 0.81 05/26/2013 1440   CALCIUM 8.4 08/16/2013 0448   GFRNONAA >90 08/16/2013 0448   GFRAA >90 08/16/2013 0448    INR    Component Value Date/Time   INR 1.16 08/11/2013 1215    No intake or output data in the 24 hours ending 08/16/13 0735   Assessment/Plan:  73 y.o. male is s/p  Aorto-BiFemoral Bypass with 14x8 darcyon graft  5 Days Post-Op -Handling clear liquids well, will advance to carb modified diet -Leukocytosis: trending down. Today WBC 11.2. Was 20.8. On 08/14/13. Pt is afebrile. Blood cultures pending.  -Hypokalemia: will supplement. Sodium normalizing.  -Continue ambulation, IS.  -DVT prophylaxis: Lovenox -Disposition: Anticipate discharge  home today with home health   [name], PA-C [facility] Office: [phone] Pager: [phone] 08/16/2013 7:35 AM  Agree with the above D/c if tolerates regular diet at lunch Labs abnormalities corrected   [name]

## 2013-08-16 NOTE — Progress Notes (Signed)
08/16/2013 1:07 PM Nursing note Confirmed with [name] PAC that pt. Central line needs to be d/c. Order received and subclavian central line d/c per order and per protocol. Occlusive dressing applied and pressure held for 5 minutes. End intact. Pt. Tolerated well. Will continue to monitor patient.  Blanchard Kelch Orvella Digiulio

## 2013-08-16 NOTE — Progress Notes (Signed)
Inpatient Diabetes Program Recommendations  AACE/ADA: New Consensus Statement on Inpatient Glycemic Control (2013)  Target Ranges:  Prepandial:   less than 140 mg/dL      Peak postprandial:   less than 180 mg/dL (1-2 hours)      Critically ill patients:  140 - 180 mg/dL  Results for ALOISE, SHELLMAN (MRN [id number]) as of 08/16/2013 09:47  Ref. Range 08/15/2013 12:59 08/15/2013 16:33 08/15/2013 20:54 08/15/2013 21:39 08/16/2013 05:53  Glucose-Capillary Latest Range: 70-99 mg/dL 549 (H) 826 (H) 56 (L) 80 82   Inpatient Diabetes Program Recommendations Correction (SSI): decrease to moderate scale  Thank you  Piedad Climes BSN, RN,CDE Inpatient Diabetes Coordinator [phone] (team pager)

## 2013-08-20 LAB — CULTURE, BLOOD (ROUTINE X 2): Culture: NO GROWTH

## 2013-08-20 LAB — CULTURE, BLOOD (SINGLE): Culture: NO GROWTH

## 2013-08-21 ENCOUNTER — Telehealth: Payer: Self-pay

## 2013-08-21 NOTE — Discharge Summary (Signed)
I agree with the above  Wells Jerard Bays 

## 2013-08-21 NOTE — Telephone Encounter (Signed)
Phone call from pt. Reported swelling in bilateral lower legs, up to knees.  Stated the swelling is worse in feet and ankles.  Stated he has been propping his feet up.  Advised swelling of lower extremities is not unusual in the early post-op period.  Advised to elevate his legs at intervals, during day, above level of heart.  Encouraged to continue to walk at intervals during day/ evening, as tolerated, to build on his strength, and improve circulation.  Stated has only had one BM since discharged from hospital.  Denies any abdominal bloating, nausea/ vomiting.  Stated he is passing gas without problems.  Denies using any pain medication.  Stated he just hasn't had any pain.  Admits to eating less than usual, compared to prior to surgery; reported he eats smaller portions.  Stated he is drinking very well, and urinating well.  Encouraged to hydrate with non-caffeinated beverages.  Also, advised he could try Prune juice, apples, or MOM 1 TBSP, to get bowels working.  Advised to call if no improvement in symptoms.  Verb. understanding.

## 2013-08-29 ENCOUNTER — Telehealth: Payer: Self-pay

## 2013-08-29 NOTE — Telephone Encounter (Signed)
Phone call from pt.  Stated his legs are still swollen from feet to calves; "today the right leg is less swollen than the left, but that varies."  Stated "I have my legs propped-up on a stool, but not up very high."   C/o having low back pain.  Also, c/o feeling cold all the time.  Reported his air conditioning is set at 76 degrees, and he still feels cold.  Denied checking his temperature; stated "I usually can tell when I am running a fever, and I don't feel feverish."  Advised the generalized cold sensation may be related to his body adjusting to the major surgery he has been through.  Regarding the low back pain, advised to apply ice pack at intervals, and to take Ibuprofen, per package directions, up to 3 x/ day; advised the low back pain may be r/t positioning with elevating legs.  Reported his incisions "look great".  Advised will make Dr. Myra GianottiBrabham aware of symptoms, and of nurse recommendations.  Verb. understanding.  Reminded pt. of f/u appt. 09/04/13 @ 8:30 AM.

## 2013-09-01 ENCOUNTER — Encounter: Payer: Self-pay | Admitting: Surgery

## 2013-09-04 ENCOUNTER — Ambulatory Visit (INDEPENDENT_AMBULATORY_CARE_PROVIDER_SITE_OTHER): Payer: Self-pay | Admitting: Surgery

## 2013-09-04 ENCOUNTER — Encounter: Payer: Self-pay | Admitting: Surgery

## 2013-09-04 VITALS — BP 156/74 | HR 77 | Temp 97.7°F | Resp 18 | Ht 66.0 in | Wt 136.5 lb

## 2013-09-04 DIAGNOSIS — I739 Peripheral vascular disease, unspecified: Secondary | ICD-10-CM

## 2013-09-04 NOTE — Progress Notes (Signed)
This is the patient's first postoperative visit.  On 08/11/2013, he underwent an aortobifemoral bypass graft using a 14 x 8 dacryon graft.  This was done for lifestyle limiting claudication.  He has a history of lower extremity ulceration which ultimately healed.  His postoperative course was uncomplicated.  He reports while at home that his pain has been well-controlled and he rarely takes a pain pill.  He is having regular bowel movements and has had a good appetite.  He is a little frustrated with the lack of energy.  He fatigues easily.  His legs no longer bother him.  On examination his midline incision is well-healed.  Bilateral femoral incisions are well-healed with palpable femoral pulses.  No significant right leg swelling.  1-2+ edema, left leg  Overall the patient has had a Recovery.  He continues to improve.  I lack of followup in 3 months with ABIs.

## 2013-09-13 NOTE — Telephone Encounter (Signed)
Encounter complete. 

## 2013-10-02 ENCOUNTER — Encounter: Payer: Self-pay | Admitting: *Deleted

## 2013-10-02 ENCOUNTER — Encounter: Payer: Medicare HMO | Attending: Family Medicine | Admitting: *Deleted

## 2013-10-02 VITALS — Ht 65.5 in | Wt 134.8 lb

## 2013-10-02 DIAGNOSIS — E1065 Type 1 diabetes mellitus with hyperglycemia: Secondary | ICD-10-CM

## 2013-10-02 DIAGNOSIS — Z794 Long term (current) use of insulin: Secondary | ICD-10-CM | POA: Insufficient documentation

## 2013-10-02 DIAGNOSIS — IMO0002 Reserved for concepts with insufficient information to code with codable children: Secondary | ICD-10-CM | POA: Insufficient documentation

## 2013-10-02 DIAGNOSIS — Z713 Dietary counseling and surveillance: Secondary | ICD-10-CM | POA: Diagnosis present

## 2013-10-02 NOTE — Patient Instructions (Signed)
Plan:  Aim for 3 Carb Choices per meal (45 grams) +/- 1 either way  Aim for 0-2 Carbs per snack if hungry  Include protein in moderation with your meals and snacks Consider Continuous Glucose Monitoring and go to Websites:  Dex[employer].com  [employer].com Consider reading food labels for Total Carbohydrate of foods Continue with your activity level by walking daily as tolerated Continue checking BG at alternate times per day as directed by MD  Be aware that based on your current insulin dose, 1 unit of Analog insulin will probably lower your  BG by about 30 mg/dl.

## 2013-10-02 NOTE — Progress Notes (Signed)
Appt start time: 1400 end time:  1530.  Assessment:  Patient was seen on  10/02/13 for individual diabetes education. History of DM 1 for 48 years. He lives alone, shops and fixes his own meals. He SMBG 4-5 times a day with reported ranges of 130 or below.He is prescribed Novolin 70/30 but has purchased analog insulin that he got in the hospital for corrections and he is using it at home which he states has lowered his averages.  Current HbA1c: 7.6%  Preferred Learning Style:   No preference indicated   Learning Readiness:   Ready  Change in progress  MEDICATIONS: see list, diabetes medications: Novolin 70/30 once a day at 11 AM plus additional Humalog that he bought without Rx but is using to correct hyperglycemia himself  DIETARY INTAKE:  24-hr recall:  B ( AM): none, sleeps late  Snk ( AM): no  L (11 AM): lunch or breakfast foods: vegetables, starch, meat, unsweet tea with lemon Snk ( PM): not unless low BG D ( PM): similar to lunch, sometimes left overs OR stir-fry package meal Snk ( PM): if low BG Beverages: unsweet tea  Usual physical activity: walks up to a mile now that he has had aortic bypass   Estimated energy needs: 1500 calories 170 g carbohydrates 112 g protein 42 g fat  Progress Towards Goal(s):  In progress.   Nutritional Diagnosis:  NB-1.1 Food and nutrition-related knowledge deficit As related to diabetes.  As evidenced by A1c of 7.6%.    Intervention:  Nutrition counseling provided.  Discussed diabetes disease process and treatment options.  Discussed physiology of DM 1 and DM 2 diabetes   Provided education on macronutrients on glucose levels.  Provided education on carb counting, importance of regularly scheduled meals/snacks, and meal planning  Discussed effects of physical activity on glucose levels and long-term glucose control.  Recommended gradual increase in his physical activity/week.  Reviewed patient medications.  Discussed role of  medication on blood glucose and possible side effects including insulin actions  Discussed blood glucose monitoring and interpretation.  Discussed recommended target ranges and individual ranges.    Described short-term complications: hyper- and hypo-glycemia.  Discussed causes,symptoms, and treatment options.  Discussed prevention, detection, and treatment of long-term complications.  Discussed the role of prolonged elevated glucose levels on body systems.  Discussed role of stress on blood glucose levels and discussed strategies to manage psychosocial issues.  Discussed recommendations for long-term diabetes self-care.  Provided checklist for medical, dental, and emotional self-care.  Informed him of CGM options, how they work and that they would provide alarms for high and/or low BGs  Plan:  Aim for 3 Carb Choices per meal (45 grams) +/- 1 either way  Aim for 0-2 Carbs per snack if hungry  Include protein in moderation with your meals and snacks Consider Continuous Glucose Monitoring and go to Websites:  [email or web address]  [email or web address] Consider reading food labels for Total Carbohydrate of foods Continue with your activity level by walking daily as tolerated Continue checking BG at alternate times per day as directed by MD  Be aware that based on your current insulin dose, 1 unit of Analog insulin will probably lower your  BG by about 30 mg/dl.  Teaching Method Utilized: Visual, Auditory and Hands on  Handouts given during visit include: Living Well with Diabetes Carb Counting and Food Label handouts Meal Plan Card Insulin action handout  Barriers to learning/adherence to lifestyle change: none  Diabetes self-care support plan:   [facility]  DM 1 support group  Demonstrated degree of understanding via:  Teach Back   Monitoring/Evaluation:  Dietary intake, exercise, reading food labels, SMBG, and body weight as needed

## 2013-10-24 DIAGNOSIS — Z1331 Encounter for screening for depression: Secondary | ICD-10-CM | POA: Insufficient documentation

## 2013-10-26 ENCOUNTER — Ambulatory Visit: Payer: Medicare HMO | Attending: Family Medicine

## 2013-10-26 DIAGNOSIS — IMO0001 Reserved for inherently not codable concepts without codable children: Secondary | ICD-10-CM | POA: Insufficient documentation

## 2013-10-26 DIAGNOSIS — R262 Difficulty in walking, not elsewhere classified: Secondary | ICD-10-CM | POA: Insufficient documentation

## 2013-10-26 DIAGNOSIS — E119 Type 2 diabetes mellitus without complications: Secondary | ICD-10-CM | POA: Insufficient documentation

## 2013-10-26 DIAGNOSIS — M25569 Pain in unspecified knee: Secondary | ICD-10-CM | POA: Insufficient documentation

## 2013-10-27 ENCOUNTER — Ambulatory Visit: Payer: Medicare HMO | Admitting: Physical Therapy

## 2013-11-01 ENCOUNTER — Ambulatory Visit: Payer: Medicare HMO

## 2013-11-01 DIAGNOSIS — IMO0001 Reserved for inherently not codable concepts without codable children: Secondary | ICD-10-CM | POA: Diagnosis not present

## 2013-11-03 ENCOUNTER — Ambulatory Visit: Payer: Medicare HMO

## 2013-11-03 DIAGNOSIS — IMO0001 Reserved for inherently not codable concepts without codable children: Secondary | ICD-10-CM | POA: Diagnosis not present

## 2013-11-06 ENCOUNTER — Encounter: Payer: Medicare HMO | Attending: Family Medicine | Admitting: *Deleted

## 2013-11-06 DIAGNOSIS — IMO0002 Reserved for concepts with insufficient information to code with codable children: Secondary | ICD-10-CM | POA: Diagnosis present

## 2013-11-06 DIAGNOSIS — Z713 Dietary counseling and surveillance: Secondary | ICD-10-CM | POA: Diagnosis present

## 2013-11-06 DIAGNOSIS — Z794 Long term (current) use of insulin: Secondary | ICD-10-CM | POA: Diagnosis not present

## 2013-11-06 DIAGNOSIS — E1065 Type 1 diabetes mellitus with hyperglycemia: Secondary | ICD-10-CM | POA: Diagnosis not present

## 2013-11-07 ENCOUNTER — Ambulatory Visit: Payer: Commercial Managed Care - HMO | Admitting: *Deleted

## 2013-11-07 ENCOUNTER — Ambulatory Visit: Payer: Medicare HMO

## 2013-11-07 DIAGNOSIS — IMO0001 Reserved for inherently not codable concepts without codable children: Secondary | ICD-10-CM | POA: Diagnosis not present

## 2013-11-09 ENCOUNTER — Ambulatory Visit: Payer: Medicare HMO

## 2013-11-09 DIAGNOSIS — IMO0001 Reserved for inherently not codable concepts without codable children: Secondary | ICD-10-CM | POA: Diagnosis not present

## 2013-11-13 ENCOUNTER — Ambulatory Visit: Payer: Medicare HMO

## 2013-11-13 DIAGNOSIS — IMO0001 Reserved for inherently not codable concepts without codable children: Secondary | ICD-10-CM | POA: Diagnosis not present

## 2013-11-15 ENCOUNTER — Ambulatory Visit: Payer: Medicare HMO | Attending: Family Medicine | Admitting: Rehabilitation

## 2013-11-15 DIAGNOSIS — IMO0001 Reserved for inherently not codable concepts without codable children: Secondary | ICD-10-CM | POA: Insufficient documentation

## 2013-11-15 DIAGNOSIS — R262 Difficulty in walking, not elsewhere classified: Secondary | ICD-10-CM | POA: Diagnosis not present

## 2013-11-15 DIAGNOSIS — M25569 Pain in unspecified knee: Secondary | ICD-10-CM | POA: Diagnosis not present

## 2013-11-15 DIAGNOSIS — E119 Type 2 diabetes mellitus without complications: Secondary | ICD-10-CM | POA: Diagnosis not present

## 2013-11-15 NOTE — Progress Notes (Signed)
Introduction to CGM Therapy:  Appt start time: 1330 end time:  1400.  Assessment:  This patient has DM 1 and their primary concerns today: see operation of Dexcom CGM product.  This patient is interested in learning more about CGM therapy because he wants more data as to his BG variability with warnings for hypoglycemia  MEDICATIONS: see list. Diabetes medications are Novolin 70/30 with additional analog insulin he purchased on his own to be able to correct hyperglycemia  Progress Towards Obtaining CGM Goal(s):  In progress.  Patient states their expectations of CGM therapy include: more data to be able to control BG more effectively and to warn him of hypoglycemia Patient expresses understanding that for improved outcomes for their diabetes with CGM they will:  Check BG 3-4 times per day  Change out CGM sensor set at least every 7 days  Upload CGM information to software on a regular basis so provider can assess patterns and make setting adjustments.     Intervention:    Demonstrated Dexcom CGM and button pushing for operation of Dexcom receiver  Explained importance of testing BG at least 2 times per day for appropriate calibration of CGM  Handouts given during visit include:  He already has Dexcom materials  Monitoring/Evaluation:    Patient does want to continue with pursuit of Dexcom CGM.  Patient gave me permission to contact Dexcom rep so they can provide him with financial information and insurance coverage, if any.  I have emailed Dexcom so they can communicate with patient and provide the information he needs to make his decision. Patient to contact this office when CGM is shipped so training can be arranged.

## 2013-11-24 ENCOUNTER — Encounter: Payer: Self-pay | Admitting: Surgery

## 2013-11-27 ENCOUNTER — Ambulatory Visit: Payer: Medicare HMO | Admitting: Surgery

## 2013-11-27 ENCOUNTER — Encounter (HOSPITAL_COMMUNITY): Payer: Medicare HMO

## 2013-11-27 ENCOUNTER — Encounter (HOSPITAL_COMMUNITY): Payer: Commercial Managed Care - HMO

## 2013-11-27 ENCOUNTER — Ambulatory Visit: Payer: Self-pay | Admitting: Surgery

## 2013-12-01 ENCOUNTER — Encounter: Payer: Self-pay | Admitting: Surgery

## 2013-12-04 ENCOUNTER — Encounter (HOSPITAL_COMMUNITY): Payer: Commercial Managed Care - HMO

## 2013-12-04 ENCOUNTER — Ambulatory Visit: Payer: Self-pay | Admitting: Surgery

## 2013-12-04 ENCOUNTER — Other Ambulatory Visit (HOSPITAL_COMMUNITY): Payer: Self-pay

## 2013-12-04 ENCOUNTER — Ambulatory Visit: Payer: Commercial Managed Care - HMO | Admitting: *Deleted

## 2013-12-06 ENCOUNTER — Ambulatory Visit: Payer: Commercial Managed Care - HMO | Admitting: Endocrinology

## 2013-12-06 ENCOUNTER — Telehealth: Payer: Self-pay | Admitting: Endocrinology

## 2013-12-06 NOTE — Telephone Encounter (Signed)
Lvom advising pt to call back and reschedule appointment,

## 2013-12-06 NOTE — Telephone Encounter (Signed)
Follow up advised. Contact patient and schedule next available appt

## 2013-12-06 NOTE — Telephone Encounter (Signed)
Patient no showed today's appt. Please advise on how to follow up. °A. No follow up necessary. °B. Follow up urgent. Contact patient immediately. °C. Follow up necessary. Contact patient and schedule visit in ___ days. °D. Follow up advised. Contact patient and schedule visit in ____weeks. ° °

## 2013-12-11 ENCOUNTER — Encounter: Payer: Medicare HMO | Attending: Family Medicine | Admitting: *Deleted

## 2013-12-11 VITALS — Ht 66.0 in | Wt 137.6 lb

## 2013-12-11 DIAGNOSIS — IMO0002 Reserved for concepts with insufficient information to code with codable children: Secondary | ICD-10-CM

## 2013-12-11 DIAGNOSIS — Z794 Long term (current) use of insulin: Secondary | ICD-10-CM | POA: Insufficient documentation

## 2013-12-11 DIAGNOSIS — Z713 Dietary counseling and surveillance: Secondary | ICD-10-CM | POA: Diagnosis present

## 2013-12-11 DIAGNOSIS — E1065 Type 1 diabetes mellitus with hyperglycemia: Secondary | ICD-10-CM | POA: Insufficient documentation

## 2013-12-11 NOTE — Patient Instructions (Signed)
Plan:  Aim for 3 Carb Choices per meal (45 grams) +/- 1 either way  Aim for 0-2 Carbs per snack if hungry  Include protein in moderation with your meals and snacks Consider reading food labels for Total Carbohydrate of foods Continue with your activity level by walking daily as tolerated Continue checking BG at alternate times per day as directed by MD  Be aware that based on your current insulin dose, 1 unit of Analog insulin will probably lower your  BG by about 30 mg/dl Let me know when Dexcom is ordered so we can set up training for you.

## 2013-12-11 NOTE — Progress Notes (Signed)
Appt start time: 0815 end time:  0900.  Assessment:  Patient was seen on  12/11/2013/15 for individual diabetes education follow up visit. He feels he is doing well with Carb Counting but has a few questions. He would like to pursue the Dexcom CGM and understands Medicare will not pay for it, but is planning to see Dr. Everardo All this week to get Rx for it so it can be ordered. He continues with Novolin 70/30 insulin with good results. He states he rarely is needing the Humalog insulin for correction to high BG lately.   Current HbA1c: 7.6%  Preferred Learning Style:   No preference indicated   Learning Readiness:   Ready  Change in progress  MEDICATIONS: see list, diabetes medications: Novolin 70/30 once a day at 11 AM plus additional Humalog that he bought without Rx but is using to correct hyperglycemia himself  DIETARY INTAKE:  24-hr recall:  B ( AM): none, sleeps late  Snk ( AM): no  L (11 AM): lunch or breakfast foods: vegetables, starch, meat, unsweet tea with lemon Snk ( PM): not unless low BG D ( PM): similar to lunch, sometimes left overs OR stir-fry package meal Snk ( PM): if low BG Beverages: unsweet tea  Usual physical activity: walks up to a mile now that he has had aortic bypass   Estimated energy needs: 1500 calories 170 g carbohydrates 112 g protein 42 g fat  Progress Towards Goal(s):  In progress.   Nutritional Diagnosis:  NB-1.1 Food and nutrition-related knowledge deficit As related to diabetes.  As evidenced by A1c of 7.6%.    Intervention:  Nutrition counseling provided.  Answered questions as to data that the Dexcom CGM will provide and alerts available.  Reviewed Carb Counting per his request and reading of food labels  Commended him on his continued progress with Diabetes control  Plan:  Aim for 3 Carb Choices per meal (45 grams) +/- 1 either way  Aim for 0-2 Carbs per snack if hungry  Include protein in moderation with your meals and  snacks Consider reading food labels for Total Carbohydrate of foods Continue with your activity level by walking daily as tolerated Continue checking BG at alternate times per day as directed by MD  Be aware that based on your current insulin dose, 1 unit of Analog insulin will probably lower your  BG by about 30 mg/dl Let me know when Dexcom is ordered so we can set up training for you.   Teaching Method Utilized: Visual, Auditory   Handouts given during visit include: No new handouts at this visit  Barriers to learning/adherence to lifestyle change: none  Diabetes self-care support plan:   [facility] DM 1 support group  Demonstrated degree of understanding via:  Teach Back   Monitoring/Evaluation:  Dietary intake, exercise, reading food labels, SMBG, and body weight as needed

## 2013-12-12 ENCOUNTER — Encounter: Payer: Self-pay | Admitting: Endocrinology

## 2013-12-12 ENCOUNTER — Ambulatory Visit (INDEPENDENT_AMBULATORY_CARE_PROVIDER_SITE_OTHER): Payer: Commercial Managed Care - HMO | Admitting: Endocrinology

## 2013-12-12 VITALS — BP 136/60 | HR 87 | Temp 97.9°F | Ht 66.0 in | Wt 137.0 lb

## 2013-12-12 DIAGNOSIS — E1051 Type 1 diabetes mellitus with diabetic peripheral angiopathy without gangrene: Secondary | ICD-10-CM

## 2013-12-12 DIAGNOSIS — E1059 Type 1 diabetes mellitus with other circulatory complications: Secondary | ICD-10-CM

## 2013-12-12 DIAGNOSIS — I798 Other disorders of arteries, arterioles and capillaries in diseases classified elsewhere: Secondary | ICD-10-CM

## 2013-12-12 NOTE — Patient Instructions (Addendum)
good diet and exercise habits significanly improve the control of your diabetes.  please let me know if you wish to be referred to a dietician.  high blood sugar is very risky to your health.  you should see an eye doctor and dentist every year.  It is very important to get all recommended vaccinations.  controlling your blood pressure and cholesterol drastically reduces the damage diabetes does to your body.  this also applies to quitting smoking.  please discuss these with your doctor.  check your blood sugar 4 times a day: before the 3 meals, and at bedtime.  also check if you have symptoms of your blood sugar being too high or too low.  please keep a record of the readings and bring it to your next appointment here.  You can write it on any piece of paper.  please call us sooner if your blood sugar goes below 70, or if you have a lot of readings over 200.   i'll do the form for a dexcom continuous glucose monitor.   Please reduce the insulin to 55 units with breakfast. Please come back for a follow-up appointment in 1 month.

## 2013-12-12 NOTE — Progress Notes (Signed)
Subjective:    Patient ID: Steven Sanders, male    DOB: 02/17/1941, 73 y.o.   MRN: [id number]  HPI pt states DM was dx'ed in 1960; he has mild ineuropathy of the lower extremities, and associated nephropathy, retinopathy, and PAD; he has been on insulin since since dx; pt says his diet and exercise are good; he has never had pancreatitis, he has had multiple episodes of severe hypoglycemia, most recently in 2011; he is unaware of ever having had DKA.   Past Medical History  Diagnosis Date  . Hypercholesterolemia   . Diabetes mellitus   . Peripheral vascular disease   . Hypertension   . COPD (chronic obstructive pulmonary disease)     stopped smoking 5 years ago  . GERD (gastroesophageal reflux disease)     Past Surgical History  Procedure Laterality Date  . Hernia repair    . Colonoscopy    . Colonoscopy w/ biopsies and polypectomy  2008  . Aorta - bilateral femoral artery bypass graft N/A 08/11/2013    Procedure: AORTA BIFEMORAL BYPASS GRAFT;  Surgeon: Nada LibmanVance W Brabham, MD;  Location: [facility] OR;  Service: Vascular;  Laterality: N/A;    History   Social History  . Marital Status: Widowed    Spouse Name: N/A    Number of Children: N/A  . Years of Education: N/A   Occupational History  . Not on file.   Social History Main Topics  . Smoking status: Former Smoker -- 1.00 packs/day for 50 years    Types: Cigarettes    Quit date: 01/14/2009  . Smokeless tobacco: Never Used  . Alcohol Use: 5.4 oz/week    3 Glasses of wine, 3 Cans of beer, 3 Shots of liquor per week  . Drug Use: No  . Sexual Activity: Not on file   Other Topics Concern  . Not on file   Social History Narrative  . No narrative on file    Current Outpatient Prescriptions on File Prior to Visit  Medication Sig Dispense Refill  . aspirin 81 MG tablet Take 81 mg by mouth daily.      . cilostazol (PLETAL) 50 MG tablet Take 50 mg by mouth 2 (two) times daily.       Marland Kitchen. lisinopril (PRINIVIL,ZESTRIL) 5 MG tablet  Take 5 mg by mouth daily.      Marland Kitchen. NOVOLIN 70/30 (70-30) 100 UNIT/ML injection Inject 55 Units into the skin daily with breakfast.       . omeprazole (PRILOSEC) 20 MG capsule Take 20 mg by mouth daily.       Marland Kitchen. oxyCODONE (ROXICODONE) 5 MG immediate release tablet Take 1 tablet (5 mg total) by mouth every 6 (six) hours as needed for severe pain.  30 tablet  0  . rosuvastatin (CRESTOR) 40 MG tablet Take 40 mg by mouth daily.       No current facility-administered medications on file prior to visit.    Allergies  Allergen Reactions  . Neomycin Other (See Comments)    unknown  . Tea Tree Oil Other (See Comments)    unknown  . Sulfa Antibiotics Other (See Comments)    unknown  . Other Rash    Chlorhexidine gluconate    Family History  Problem Relation Age of Onset  . Hyperlipidemia Mother   . Cancer Mother   . Hyperlipidemia Father   . Cancer Father   . Diabetes Neg Hx     BP 136/60  Pulse 87  Temp(Src) 97.9  F (36.6 C) (Oral)  Ht 5\' 6"  (1.676 m)  Wt 137 lb (62.143 kg)  BMI 22.12 kg/m2  SpO2 97%  Review of Systems denies blurry vision, headache, chest pain, sob, n/v, urinary frequency, muscle cramps, excessive diaphoresis, depression, and rhinorrhea.  He has weight gain, easy bruising, and cold intolerance.     Objective:   Physical Exam VS: see vs page GEN: no distress HEAD: head: no deformity eyes: no periorbital swelling, no proptosis external nose and ears are normal mouth: no lesion seen NECK: supple, thyroid is not enlarged CHEST WALL: no deformity LUNGS: clear to auscultation BREASTS:  No gynecomastia CV: reg rate and rhythm, no murmur ABD: abdomen is soft, nontender.  no hepatosplenomegaly.  not distended.  no hernia.  Old healed midline surgical scar. MUSCULOSKELETAL: muscle bulk and strength are grossly normal.  no obvious joint swelling.  gait is normal and steady EXTEMITIES: no deformity.  no ulcer on the feet.  feet are of normal color and temp.  Trace  bilat leg edema.  There is a 5 mm shallow ulcer at the right foot, dorsal surface.   PULSES: dorsalis pedis absent bilat.  no carotid bruit NEURO:  cn 2-12 grossly intact.   readily moves all 4's.  sensation is intact to touch on the feet.  SKIN:  Normal texture and temperature.  No rash or suspicious lesion is visible.   NODES:  None palpable at the neck PSYCH: alert, well-oriented.  Does not appear anxious nor depressed.  i reviewed electrocardiogram (may, 2015)  i have reviewed the following outside records:  Office notes  outside test results are reviewed:   A1c=6.3    Assessment & Plan:  DM: new to me. Overcontrolled.  He declines pump.  After discussion, he wants to continue qd insulin.    Patient is advised the following: Patient Instructions  good diet and exercise habits significanly improve the control of your diabetes.  please let me know if you wish to be referred to a dietician.  high blood sugar is very risky to your health.  you should see an eye doctor and dentist every year.  It is very important to get all recommended vaccinations.  controlling your blood pressure and cholesterol drastically reduces the damage diabetes does to your body.  this also applies to quitting smoking.  please discuss these with your doctor.  check your blood sugar 4 times a day: before the 3 meals, and at bedtime.  also check if you have symptoms of your blood sugar being too high or too low.  please keep a record of the readings and bring it to your next appointment here.  You can write it on any piece of paper.  please call us sooner if your blood sugar goes below 70, or if you have a lot of readings over 200.   i'll do the form for a dexcom continuous glucose monitor.   Please reduce the insulin to 55 units with breakfast. Please come back for a follow-up appointment in 1 month.

## 2013-12-14 ENCOUNTER — Telehealth: Payer: Self-pay | Admitting: Endocrinology

## 2013-12-14 DIAGNOSIS — E1051 Type 1 diabetes mellitus with diabetic peripheral angiopathy without gangrene: Secondary | ICD-10-CM | POA: Insufficient documentation

## 2013-12-14 NOTE — Telephone Encounter (Signed)
please call dexcom at [phone] i need a form for continuous glucose monitor

## 2013-12-14 NOTE — Telephone Encounter (Signed)
Unable to reach Dex[employer] will try again at a later time.  

## 2013-12-15 NOTE — Telephone Encounter (Signed)
[facility] will try again at a later time.

## 2013-12-20 ENCOUNTER — Telehealth: Payer: Self-pay | Admitting: Endocrinology

## 2013-12-20 NOTE — Telephone Encounter (Signed)
Lvom advising that form has been requested from Dexcom. Pt informed once form is received he would be notified. Requested call back if pt would like discuss.

## 2013-12-20 NOTE — Telephone Encounter (Signed)
Pt needs call back regarding rx for dexcom

## 2013-12-20 NOTE — Telephone Encounter (Signed)
Form requested

## 2013-12-21 NOTE — Telephone Encounter (Signed)
Pt advised that paper work has been faxed.  

## 2013-12-21 NOTE — Telephone Encounter (Signed)
Received paperwork 

## 2014-01-02 ENCOUNTER — Encounter: Payer: Commercial Managed Care - HMO | Admitting: Nutrition

## 2014-01-11 ENCOUNTER — Ambulatory Visit: Payer: Commercial Managed Care - HMO | Admitting: Endocrinology

## 2014-01-15 ENCOUNTER — Encounter (HOSPITAL_COMMUNITY): Payer: Medicare HMO

## 2014-01-15 ENCOUNTER — Other Ambulatory Visit (HOSPITAL_COMMUNITY): Payer: Medicare HMO

## 2014-01-15 ENCOUNTER — Ambulatory Visit: Payer: Medicare HMO | Admitting: Surgery

## 2014-01-19 ENCOUNTER — Encounter: Payer: Self-pay | Admitting: Surgery

## 2014-01-22 ENCOUNTER — Encounter (HOSPITAL_COMMUNITY): Payer: Commercial Managed Care - HMO

## 2014-01-22 ENCOUNTER — Ambulatory Visit (HOSPITAL_COMMUNITY)
Admission: RE | Admit: 2014-01-22 | Discharge: 2014-01-22 | Disposition: A | Discharge status: Home / Self Care | Payer: Medicare HMO | Source: Ambulatory Visit | Attending: Surgery | Admitting: Surgery

## 2014-01-22 ENCOUNTER — Encounter: Payer: Self-pay | Admitting: Surgery

## 2014-01-22 ENCOUNTER — Ambulatory Visit: Payer: Medicare HMO | Admitting: Surgery

## 2014-01-22 ENCOUNTER — Other Ambulatory Visit (HOSPITAL_COMMUNITY): Payer: Self-pay

## 2014-01-22 ENCOUNTER — Ambulatory Visit (INDEPENDENT_AMBULATORY_CARE_PROVIDER_SITE_OTHER): Payer: Commercial Managed Care - HMO | Admitting: Surgery

## 2014-01-22 ENCOUNTER — Other Ambulatory Visit (HOSPITAL_COMMUNITY): Payer: Medicare HMO

## 2014-01-22 VITALS — BP 132/60 | HR 82 | Resp 18 | Ht 66.0 in | Wt 141.8 lb

## 2014-01-22 DIAGNOSIS — E119 Type 2 diabetes mellitus without complications: Secondary | ICD-10-CM | POA: Insufficient documentation

## 2014-01-22 DIAGNOSIS — I6523 Occlusion and stenosis of bilateral carotid arteries: Secondary | ICD-10-CM

## 2014-01-22 DIAGNOSIS — I739 Peripheral vascular disease, unspecified: Secondary | ICD-10-CM

## 2014-01-22 DIAGNOSIS — I10 Essential (primary) hypertension: Secondary | ICD-10-CM | POA: Diagnosis not present

## 2014-01-22 DIAGNOSIS — E785 Hyperlipidemia, unspecified: Secondary | ICD-10-CM | POA: Diagnosis not present

## 2014-01-22 DIAGNOSIS — Z48812 Encounter for surgical aftercare following surgery on the circulatory system: Secondary | ICD-10-CM | POA: Diagnosis not present

## 2014-01-22 NOTE — Progress Notes (Signed)
Patient name: Steven Sanders J Hollabaugh MRN: [id number] DOB: 02-15-1941 Sex: male     Chief Complaint  Patient presents with  . Follow-up    6 month FU   s/p 08-11-2013  aortobifemoral graft  . PVD    HISTORY OF PRESENT ILLNESS: On 08/11/2013, he underwent an aortobifemoral bypass graft using a 14 x 8 dacryon graft. This was done for lifestyle limiting claudication. He has a history of lower extremity ulceration which ultimately healed.  He feels that his energy level is improving.  He does not report any symptoms of claudication.  Past Medical History  Diagnosis Date  . Hypercholesterolemia   . Diabetes mellitus   . Peripheral vascular disease   . Hypertension   . COPD (chronic obstructive pulmonary disease)     stopped smoking 5 years ago  . GERD (gastroesophageal reflux disease)     Past Surgical History  Procedure Laterality Date  . Hernia repair    . Colonoscopy    . Colonoscopy w/ biopsies and polypectomy  2008  . Aorta - bilateral femoral artery bypass graft N/A 08/11/2013    Procedure: AORTA BIFEMORAL BYPASS GRAFT;  Surgeon: Nada LibmanVance W Brabham, MD;  Location: [facility] OR;  Service: Vascular;  Laterality: N/A;    History   Social History  . Marital Status: Widowed    Spouse Name: N/A    Number of Children: N/A  . Years of Education: N/A   Occupational History  . Not on file.   Social History Main Topics  . Smoking status: Former Smoker -- 1.00 packs/day for 50 years    Types: Cigarettes    Quit date: 01/14/2009  . Smokeless tobacco: Never Used  . Alcohol Use: 5.4 oz/week    3 Glasses of wine, 3 Cans of beer, 3 Shots of liquor per week  . Drug Use: No  . Sexual Activity: Not on file   Other Topics Concern  . Not on file   Social History Narrative    Family History  Problem Relation Age of Onset  . Hyperlipidemia Mother   . Cancer Mother   . Hyperlipidemia Father   . Cancer Father   . Diabetes Neg Hx     Allergies as of 01/22/2014 - Review Complete  01/22/2014  Allergen Reaction Noted  . Neomycin Other (See Comments) 08/31/2011  . Tea tree oil Other (See Comments) 08/31/2011  . Sulfa antibiotics Other (See Comments) 08/31/2011  . Other Rash 08/11/2013    Current Outpatient Prescriptions on File Prior to Visit  Medication Sig Dispense Refill  . aspirin 81 MG tablet Take 81 mg by mouth daily.    . cilostazol (PLETAL) 50 MG tablet Take 50 mg by mouth 2 (two) times daily.     Marland Kitchen. lisinopril (PRINIVIL,ZESTRIL) 5 MG tablet Take 5 mg by mouth daily.    Marland Kitchen. NOVOLIN 70/30 (70-30) 100 UNIT/ML injection Inject 55 Units into the skin daily with breakfast.     . omeprazole (PRILOSEC) 20 MG capsule Take 20 mg by mouth daily.     . rosuvastatin (CRESTOR) 40 MG tablet Take 40 mg by mouth daily.    Marland Kitchen. oxyCODONE (ROXICODONE) 5 MG immediate release tablet Take 1 tablet (5 mg total) by mouth every 6 (six) hours as needed for severe pain. 30 tablet 0   No current facility-administered medications on file prior to visit.     REVIEW OF SYSTEMS: Cardiovascular: No chest pain, chest pressure, palpitations, orthopnea, or dyspnea on exertion. No claudication or  rest pain,  No history of DVT or phlebitis. Pulmonary: No productive cough, asthma or wheezing. Neurologic: No weakness, paresthesias, aphasia, or amaurosis. No dizziness. Hematologic: No bleeding problems or clotting disorders. Musculoskeletal: No joint pain or joint swelling. Gastrointestinal: No blood in stool or hematemesis Genitourinary: No dysuria or hematuria. Psychiatric:: No history of major depression. Integumentary: No rashes or ulcers. Constitutional: No fever or chills.  PHYSICAL EXAMINATION:   Vital signs are BP 132/60 mmHg  Pulse 82  Resp 18  Ht 5\' 6"  (1.676 m)  Wt 141 lb 12.8 oz (64.32 kg)  BMI 22.90 kg/m2 General: The patient appears their stated age. HEENT:  No gross abnormalities Pulmonary:  Non labored breathing Abdomen: Soft and non-tender.  Midline incision is  well-healed without evidence of hernia Musculoskeletal: There are no major deformities. Neurologic: No focal weakness or paresthesias are detected, Skin: There are no ulcer or rashes noted. Psychiatric: The patient has normal affect. Cardiovascular: There is a regular rate and rhythm without significant murmur appreciated.pedal pulses are not palpable.   Diagnostic Studies Ultrasound was ordered and reviewed.  ABI on the right is 0.70.  There are biphasic waveforms.  On the left ABI is 0.81 with biphasic waveforms.  Assessment: Peripheral vascular disease with bilateral claudication and ulcer Plan: The patient continues to recover nicely from his aortobifemoral bypass graft.  All of his wounds have healed.  There have been no evidence of complications from surgery.  His energy level continues to improve.  I have scheduled the patient for follow-up in 1 year with a duplex of his bypass, ABIs, as well as carotid Doppler studies.  Jorge NyV. Wells Brabham IV, M.D. [facility] Office: [phone] Pager:  [phone]

## 2014-01-22 NOTE — Addendum Note (Signed)
Addended by: Sharee PimpleMCCHESNEY, MARILYN K on: 01/22/2014 03:12 PM   Modules accepted: Orders

## 2014-01-29 ENCOUNTER — Encounter (HOSPITAL_COMMUNITY): Payer: Commercial Managed Care - HMO

## 2014-01-29 ENCOUNTER — Other Ambulatory Visit (HOSPITAL_COMMUNITY): Payer: Self-pay

## 2014-01-29 ENCOUNTER — Ambulatory Visit: Payer: Self-pay | Admitting: Surgery

## 2014-02-05 ENCOUNTER — Encounter (HOSPITAL_COMMUNITY): Payer: Commercial Managed Care - HMO

## 2014-02-05 ENCOUNTER — Ambulatory Visit: Payer: Self-pay | Admitting: Surgery

## 2014-02-05 ENCOUNTER — Other Ambulatory Visit (HOSPITAL_COMMUNITY): Payer: Self-pay

## 2014-05-03 DIAGNOSIS — E13319 Other specified diabetes mellitus with unspecified diabetic retinopathy without macular edema: Secondary | ICD-10-CM | POA: Diagnosis not present

## 2014-05-03 DIAGNOSIS — E780 Pure hypercholesterolemia: Secondary | ICD-10-CM | POA: Diagnosis not present

## 2014-05-03 DIAGNOSIS — I739 Peripheral vascular disease, unspecified: Secondary | ICD-10-CM | POA: Diagnosis not present

## 2014-05-03 DIAGNOSIS — E1129 Type 2 diabetes mellitus with other diabetic kidney complication: Secondary | ICD-10-CM | POA: Diagnosis not present

## 2014-05-03 DIAGNOSIS — E1149 Type 2 diabetes mellitus with other diabetic neurological complication: Secondary | ICD-10-CM | POA: Diagnosis not present

## 2014-05-03 DIAGNOSIS — G629 Polyneuropathy, unspecified: Secondary | ICD-10-CM | POA: Diagnosis not present

## 2014-05-03 DIAGNOSIS — E1139 Type 2 diabetes mellitus with other diabetic ophthalmic complication: Secondary | ICD-10-CM | POA: Diagnosis not present

## 2014-05-03 DIAGNOSIS — R809 Proteinuria, unspecified: Secondary | ICD-10-CM | POA: Diagnosis not present

## 2014-10-16 DIAGNOSIS — E13319 Other specified diabetes mellitus with unspecified diabetic retinopathy without macular edema: Secondary | ICD-10-CM | POA: Diagnosis not present

## 2014-10-16 DIAGNOSIS — E780 Pure hypercholesterolemia: Secondary | ICD-10-CM | POA: Diagnosis not present

## 2014-10-16 DIAGNOSIS — E1149 Type 2 diabetes mellitus with other diabetic neurological complication: Secondary | ICD-10-CM | POA: Diagnosis not present

## 2014-10-16 DIAGNOSIS — R809 Proteinuria, unspecified: Secondary | ICD-10-CM | POA: Diagnosis not present

## 2014-10-16 DIAGNOSIS — I739 Peripheral vascular disease, unspecified: Secondary | ICD-10-CM | POA: Diagnosis not present

## 2014-10-16 DIAGNOSIS — E1139 Type 2 diabetes mellitus with other diabetic ophthalmic complication: Secondary | ICD-10-CM | POA: Diagnosis not present

## 2014-10-16 DIAGNOSIS — E1129 Type 2 diabetes mellitus with other diabetic kidney complication: Secondary | ICD-10-CM | POA: Diagnosis not present

## 2014-10-16 DIAGNOSIS — G629 Polyneuropathy, unspecified: Secondary | ICD-10-CM | POA: Diagnosis not present

## 2014-10-24 ENCOUNTER — Encounter: Payer: Commercial Managed Care - HMO | Attending: Family Medicine | Admitting: *Deleted

## 2014-10-24 ENCOUNTER — Encounter: Payer: Self-pay | Admitting: *Deleted

## 2014-10-24 VITALS — Ht 66.0 in | Wt 156.2 lb

## 2014-10-24 DIAGNOSIS — Z713 Dietary counseling and surveillance: Secondary | ICD-10-CM | POA: Diagnosis not present

## 2014-10-24 DIAGNOSIS — E1051 Type 1 diabetes mellitus with diabetic peripheral angiopathy without gangrene: Secondary | ICD-10-CM

## 2014-10-24 DIAGNOSIS — E1151 Type 2 diabetes mellitus with diabetic peripheral angiopathy without gangrene: Secondary | ICD-10-CM | POA: Insufficient documentation

## 2014-10-24 DIAGNOSIS — E119 Type 2 diabetes mellitus without complications: Secondary | ICD-10-CM | POA: Diagnosis present

## 2014-10-24 NOTE — Patient Instructions (Addendum)
Plan Consider having some starch or fruit with some protein in the AM  Consider choosing lower fat meats and smaller portions of fat on your foods to save about 50 calories a piece Continue with your activity level, great job! Continue using the Dexcom CGM, I'm glad it is helping you out so much FYI, your current BMI is 25.3

## 2014-10-26 NOTE — Progress Notes (Signed)
Diabetes Self-Management Education  Visit Type: Follow-up  Appt. Start Time: 1530 Appt. End Time: 1700  10/26/2014  Steven Sanders, identified by name and date of birth, is a 74 y.o. male with a diagnosis of Diabetes:  .   ASSESSMENT  Height 5\' 6"  (1.676 m), weight 156 lb 3.2 oz (70.852 kg). Body mass index is 25.22 kg/(m^2).      Diabetes Self-Management Education - 10/26/14 1530    Visit Information   Visit Type Follow-up   Health Coping   How would you rate your overall health? Good   Psychosocial Assessment   Patient Belief/Attitude about Diabetes Motivated to manage diabetes   Self-care barriers None   Other persons present Patient   Patient Concerns Nutrition/Meal planning;Weight Control   Special Needs None   Learning Readiness Ready   How often do you need to have someone help you when you read instructions, pamphlets, or other written materials from your doctor or pharmacy? 1 - Never   What is the last grade level you completed in school? college graduate   Complications   Last HgB A1C per patient/outside source 6.8 %   How often do you check your blood sugar? 1-2 times/day   Fasting Blood glucose range (mg/dL) 40-981   Postprandial Blood glucose range (mg/dL) 19-147   Number of hypoglycemic episodes per month 0  no hypoglycemia since he started wearing the Dexcom CGM   Have you had a dilated eye exam in the past 12 months? Yes   Have you had a dental exam in the past 12 months? No   Are you checking your feet? Yes   Exercise   Exercise Type Light (walking / raking leaves)  gym twice a week and likes to chop wood   How many days per week to you exercise? 2   How many minutes per day do you exercise? 60   Total minutes per week of exercise 120   Patient Education   Previous Diabetes Education Yes (please comment)   Nutrition management  Other (comment)  Adjusted meal plan to decrease extra fat and to promote weight loss   Physical activity and exercise   Role of exercise on diabetes management, blood pressure control and cardiac health.   Monitoring Identified appropriate SMBG and/or A1C goals.   Acute complications Taught treatment of hypoglycemia - the 15 rule.   Chronic complications Reviewed with patient heart disease, higher risk of, and prevention   Individualized Goals (developed by patient)   Nutrition Follow meal plan discussed   Physical Activity Exercise 1-2 times per week   Monitoring  test blood glucose pre and post meals as discussed   Reducing Risk increase portions of healthy fats  choose healthy fats but in moderation to allow weight loss   Patient Self-Evaluation of Goals - Patient rates self as meeting previously set goals (% of time)   Nutrition 50 - 75 %   Physical Activity 50 - 75 %   Medications >75%   Monitoring >75%   Problem Solving 50 - 75 %   Reducing Risk >75%   Health Coping >75%   Outcomes   Expected Outcomes Demonstrated interest in learning. Expect positive outcomes   Future DMSE PRN   Program Status Completed   Subsequent Visit   Since your last visit have you continued or begun to take your medications as prescribed? Yes   Since your last visit have you experienced any weight changes? Gain   Weight Gain (lbs) 10  Since your last visit, are you checking your blood glucose at least once a day? Yes      Individualized Plan for Diabetes Self-Management Training:   Learning Objective:  Patient will have a greater understanding of diabetes self-management. Patient education plan is to attend individual and/or group sessions per assessed needs and concerns.   Plan:   Patient Instructions  Plan Consider having some starch or fruit with some protein in the AM  Consider choosing lower fat meats and smaller portions of fat on your foods to save about 50 calories a piece Continue with your activity level, great job! Continue using the Dexcom CGM, I'm glad it is helping you out so much FYI, your  current BMI is 25.3   Expected Outcomes:  Demonstrated interest in learning. Expect positive outcomes  Education material provided: A1C conversion sheet, Meal plan card and Carbohydrate counting sheet  If problems or questions, patient to contact team via:  Phone and Email  Future DSME appointment: PRN

## 2014-12-26 ENCOUNTER — Telehealth: Payer: Self-pay | Admitting: Endocrinology

## 2014-12-26 NOTE — Telephone Encounter (Signed)
please call patient: Ov is due 

## 2014-12-26 NOTE — Telephone Encounter (Signed)
Left pt a VM to return cal to office for appt

## 2015-01-23 ENCOUNTER — Encounter (HOSPITAL_COMMUNITY): Payer: Commercial Managed Care - HMO

## 2015-01-23 ENCOUNTER — Ambulatory Visit: Payer: Commercial Managed Care - HMO | Admitting: Family

## 2015-01-23 ENCOUNTER — Other Ambulatory Visit (HOSPITAL_COMMUNITY): Payer: Commercial Managed Care - HMO

## 2015-01-24 ENCOUNTER — Ambulatory Visit: Payer: Commercial Managed Care - HMO | Admitting: Family

## 2015-01-24 ENCOUNTER — Encounter (HOSPITAL_COMMUNITY): Payer: Commercial Managed Care - HMO

## 2015-03-29 ENCOUNTER — Encounter: Payer: Self-pay | Admitting: Family

## 2015-04-03 ENCOUNTER — Other Ambulatory Visit: Payer: Self-pay | Admitting: Surgery

## 2015-04-03 ENCOUNTER — Encounter: Payer: Self-pay | Admitting: Family

## 2015-04-03 ENCOUNTER — Ambulatory Visit (INDEPENDENT_AMBULATORY_CARE_PROVIDER_SITE_OTHER)
Admission: RE | Admit: 2015-04-03 | Discharge: 2015-04-03 | Disposition: A | Discharge status: Home / Self Care | Payer: Commercial Managed Care - HMO | Source: Ambulatory Visit | Attending: Surgery | Admitting: Surgery

## 2015-04-03 ENCOUNTER — Ambulatory Visit (HOSPITAL_COMMUNITY)
Admission: RE | Admit: 2015-04-03 | Discharge: 2015-04-03 | Disposition: A | Discharge status: Home / Self Care | Payer: Commercial Managed Care - HMO | Source: Ambulatory Visit | Attending: Family | Admitting: Family

## 2015-04-03 ENCOUNTER — Ambulatory Visit (INDEPENDENT_AMBULATORY_CARE_PROVIDER_SITE_OTHER): Payer: Commercial Managed Care - HMO | Admitting: Family

## 2015-04-03 VITALS — BP 128/63 | HR 74 | Temp 96.7°F | Resp 16 | Ht 66.0 in | Wt 155.0 lb

## 2015-04-03 DIAGNOSIS — I10 Essential (primary) hypertension: Secondary | ICD-10-CM | POA: Insufficient documentation

## 2015-04-03 DIAGNOSIS — Z87891 Personal history of nicotine dependence: Secondary | ICD-10-CM

## 2015-04-03 DIAGNOSIS — I739 Peripheral vascular disease, unspecified: Secondary | ICD-10-CM | POA: Insufficient documentation

## 2015-04-03 DIAGNOSIS — Z8679 Personal history of other diseases of the circulatory system: Secondary | ICD-10-CM | POA: Diagnosis not present

## 2015-04-03 DIAGNOSIS — E119 Type 2 diabetes mellitus without complications: Secondary | ICD-10-CM | POA: Diagnosis not present

## 2015-04-03 DIAGNOSIS — R0989 Other specified symptoms and signs involving the circulatory and respiratory systems: Secondary | ICD-10-CM | POA: Diagnosis not present

## 2015-04-03 DIAGNOSIS — E7800 Pure hypercholesterolemia, unspecified: Secondary | ICD-10-CM | POA: Insufficient documentation

## 2015-04-03 DIAGNOSIS — Z48812 Encounter for surgical aftercare following surgery on the circulatory system: Secondary | ICD-10-CM

## 2015-04-03 DIAGNOSIS — Z95828 Presence of other vascular implants and grafts: Secondary | ICD-10-CM | POA: Diagnosis not present

## 2015-04-03 DIAGNOSIS — I6523 Occlusion and stenosis of bilateral carotid arteries: Secondary | ICD-10-CM | POA: Diagnosis not present

## 2015-04-03 NOTE — Patient Instructions (Addendum)
Stroke Prevention Some medical conditions and behaviors are associated with an increased chance of having a stroke. You may prevent a stroke by making healthy choices and managing medical conditions. HOW CAN I REDUCE MY RISK OF HAVING A STROKE?   Stay physically active. Get at least 30 minutes of activity on most or all days.  Do not smoke. It may also be helpful to avoid exposure to secondhand smoke.  Limit alcohol use. Moderate alcohol use is considered to be:  No more than 2 drinks per day for men.  No more than 1 drink per day for nonpregnant women.  Eat healthy foods. This involves:  Eating 5 or more servings of fruits and vegetables a day.  Making dietary changes that address high blood pressure (hypertension), high cholesterol, diabetes, or obesity.  Manage your cholesterol levels.  Making food choices that are high in fiber and low in saturated fat, trans fat, and cholesterol may control cholesterol levels.  Take any prescribed medicines to control cholesterol as directed by your health care provider.  Manage your diabetes.  Controlling your carbohydrate and sugar intake is recommended to manage diabetes.  Take any prescribed medicines to control diabetes as directed by your health care provider.  Control your hypertension.  Making food choices that are low in salt (sodium), saturated fat, trans fat, and cholesterol is recommended to manage hypertension.  Ask your health care provider if you need treatment to lower your blood pressure. Take any prescribed medicines to control hypertension as directed by your health care provider.  If you are 18-39 years of age, have your blood pressure checked every 3-5 years. If you are 40 years of age or older, have your blood pressure checked every year.  Maintain a healthy weight.  Reducing calorie intake and making food choices that are low in sodium, saturated fat, trans fat, and cholesterol are recommended to manage  weight.  Stop drug abuse.  Avoid taking birth control pills.  Talk to your health care provider about the risks of taking birth control pills if you are over 35 years old, smoke, get migraines, or have ever had a blood clot.  Get evaluated for sleep disorders (sleep apnea).  Talk to your health care provider about getting a sleep evaluation if you snore a lot or have excessive sleepiness.  Take medicines only as directed by your health care provider.  For some people, aspirin or blood thinners (anticoagulants) are helpful in reducing the risk of forming abnormal blood clots that can lead to stroke. If you have the irregular heart rhythm of atrial fibrillation, you should be on a blood thinner unless there is a good reason you cannot take them.  Understand all your medicine instructions.  Make sure that other conditions (such as anemia or atherosclerosis) are addressed. SEEK IMMEDIATE MEDICAL CARE IF:   You have sudden weakness or numbness of the face, arm, or leg, especially on one side of the body.  Your face or eyelid droops to one side.  You have sudden confusion.  You have trouble speaking (aphasia) or understanding.  You have sudden trouble seeing in one or both eyes.  You have sudden trouble walking.  You have dizziness.  You have a loss of balance or coordination.  You have a sudden, severe headache with no known cause.  You have new chest pain or an irregular heartbeat. Any of these symptoms may represent a serious problem that is an emergency. Do not wait to see if the symptoms will   go away. Get medical help at once. Call your local emergency services (911 in [place] Do not drive yourself to the hospital.   This information is not intended to replace advice given to you by your health care provider. Make sure you discuss any questions you have with your health care provider.   Document Released: 04/09/2004 Document Revised: 03/23/2014 Document Reviewed:  09/02/2012 Elsevier Interactive Patient Education 2016 Else[employer] Inc[employer]      Peripheral Vascular Disease Peripheral vascular disease (PVD) is a disease of the blood vessels that are not part of your heart and brain. A simple term for PVD is poor circulation. In most cases, PVD narrows the blood vessels that carry blood from your heart to the rest of your body. This can result in a decreased supply of blood to your arms, legs, and internal organs, like your stomach or kidneys. However, it most often affects a person's lower legs and feet. There are two types of PVD.  Organic PVD. This is the more common type. It is caused by damage to the structure of blood vessels.  Functional PVD. This is caused by conditions that make blood vessels contract and tighten (spasm). Without treatment, PVD tends to get worse over time. PVD can also lead to acute ischemic limb. This is when an arm or limb suddenly has trouble getting enough blood. This is a medical emergency. CAUSES Each type of PVD has many different causes. The most common cause of PVD is buildup of a fatty material (plaque) inside of your arteries (atherosclerosis). Small amounts of plaque can break off from the walls of the blood vessels and become lodged in a smaller artery. This blocks blood flow and can cause acute ischemic limb. Other common causes of PVD include:  Blood clots that form inside of blood vessels.  Injuries to blood vessels.  Diseases that cause inflammation of blood vessels or cause blood vessel spasms.  Health behaviors and health history that increase your risk of developing PVD. RISK FACTORS  You may have a greater risk of PVD if you:  Have a family history of PVD.  Have certain medical conditions, including:  High cholesterol.  Diabetes.  High blood pressure (hypertension).  Coronary heart disease.  Past problems with blood clots.  Past injury, such as burns or a broken bone. These may have damaged  blood vessels in your limbs.  Buerger disease. This is caused by inflamed blood vessels in your hands and feet.  Some forms of arthritis.  Rare birth defects that affect the arteries in your legs.  Use tobacco.  Do not get enough exercise.  Are obese.  Are age 18 or older. SIGNS AND SYMPTOMS  PVD may cause many different symptoms. Your symptoms depend on what part of your body is not getting enough blood. Some common signs and symptoms include:  Cramps in your lower legs. This may be a symptom of poor leg circulation (claudication).  Pain and weakness in your legs while you are physically active that goes away when you rest (intermittent claudication).  Leg pain when at rest.  Leg numbness, tingling, or weakness.  Coldness in a leg or foot, especially when compared with the other leg.  Skin or hair changes. These can include:  Hair loss.  Shiny skin.  Pale or bluish skin.  Thick toenails.  Inability to get or maintain an erection (erectile dysfunction). People with PVD are more prone to developing ulcers and sores on their toes, feet, or legs. These may take  longer than normal to heal. DIAGNOSIS Your health care provider may diagnose PVD from your signs and symptoms. The health care provider will also do a physical exam. You may have tests to find out what is causing your PVD and determine its severity. Tests may include:  Blood pressure recordings from your arms and legs and measurements of the strength of your pulses (pulse volume recordings).  Imaging studies using sound waves to take pictures of the blood flow through your blood vessels (Doppler ultrasound).  Injecting a dye into your blood vessels before having imaging studies using:  X-rays (angiogram or arteriogram).  Computer-generated X-rays (CT angiogram).  A powerful electromagnetic field and a computer (magnetic resonance angiogram or MRA). TREATMENT Treatment for PVD depends on the cause of your  condition and the severity of your symptoms. It also depends on your age. Underlying causes need to be treated and controlled. These include long-lasting (chronic) conditions, such as diabetes, high cholesterol, and high blood pressure. You may need to first try making lifestyle changes and taking medicines. Surgery may be needed if these do not work. Lifestyle changes may include:  Quitting smoking.  Exercising regularly.  Following a low-fat, low-cholesterol diet. Medicines may include:  Blood thinners to prevent blood clots.  Medicines to improve blood flow.  Medicines to improve your blood cholesterol levels. Surgical procedures may include:  A procedure that uses an inflated balloon to open a blocked artery and improve blood flow (angioplasty).  A procedure to put in a tube (stent) to keep a blocked artery open (stent implant).  Surgery to reroute blood flow around a blocked artery (peripheral bypass surgery).  Surgery to remove dead tissue from an infected wound on the affected limb.  Amputation. This is surgical removal of the affected limb. This may be necessary in cases of acute ischemic limb that are not improved through medical or surgical treatments. HOME CARE INSTRUCTIONS  Take medicines only as directed by your health care provider.  Do not use any tobacco products, including cigarettes, chewing tobacco, or electronic cigarettes. If you need help quitting, ask your health care provider.  Lose weight if you are overweight, and maintain a healthy weight as directed by your health care provider.  Eat a diet that is low in fat and cholesterol. If you need help, ask your health care provider.  Exercise regularly. Ask your health care provider to suggest some good activities for you.  Use compression stockings or other mechanical devices as directed by your health care provider.  Take good care of your feet.  Wear comfortable shoes that fit well.  Check your feet  often for any cuts or sores. SEEK MEDICAL CARE IF:  You have cramps in your legs while walking.  You have leg pain when you are at rest.  You have coldness in a leg or foot.  Your skin changes.  You have erectile dysfunction.  You have cuts or sores on your feet that are not healing. SEEK IMMEDIATE MEDICAL CARE IF:  Your arm or leg turns cold and blue.  Your arms or legs become red, warm, swollen, painful, or numb.  You have chest pain or trouble breathing.  You suddenly have weakness in your face, arm, or leg.  You become very confused or lose the ability to speak.  You suddenly have a very bad headache or lose your vision.   This information is not intended to replace advice given to you by your health care provider. Make sure you discuss  any questions you have with your health care provider.   Document Released: 04/09/2004 Document Revised: 03/23/2014 Document Reviewed: 08/10/2013 Elsevier Interactive Patient Education 2016 [employer].   You may wean off the cilostazol. You are taking one tablet twice daily now; decrease this to one tablet every morning for 2 weeks. Then one tablet every other day for 2 weeks; then one tablet twice weekly for 2 weeks; then once/week for two weeks, then none.

## 2015-04-03 NOTE — Progress Notes (Signed)
[facility] HISTORY AND PHYSICAL   MRN : [id number]  History of Present Illness:   Steven Sanders is a 75 y.o. male patient of Dr. Myra Gianotti who is s/p aortobifemoral bypass graft using a 14 x 8 dacryon graft on 08/11/2013. This was done for lifestyle limiting claudication. He has a history of lower extremity ulceration which ultimately healed. He has a known right SFA occlusion.   He walks a mile in 20 minutes, 2-3 days/week. He rides a bike for a mile and does resistance exercises 2-3 days/week. He denies claudication sx's with walking, denies non healing wounds in his lower extremities. He started taking the cilostazol 1.5 to 2 years before the aortobi fem bypass and is still taking the cilostazol.    The patient denies any history of TIA or stroke symptoms, specifically the patient denies a history of amaurosis fugax or monocular blindness, denies a history unilateral  of facial drooping, denies a history of hemiplegia, and denies a history of receptive or expressive aphasia.     The patient has not had back or abdominal pain.  Pt Diabetic: Yes, diagnosed at age 62, 6.8% A1C on 10/16/14 (review of records). He wears a continuous glucose monitor.  Pt smoker: former smoker, quit in 2010  Pt meds include: Statin :Yes Betablocker: No ASA: Yes Other anticoagulants/antiplatelets: cilostazol    Current Outpatient Prescriptions  Medication Sig Dispense Refill  . aspirin 81 MG tablet Take 81 mg by mouth daily.    . cilostazol (PLETAL) 50 MG tablet Take 50 mg by mouth 2 (two) times daily.     Marland Kitchen lisinopril (PRINIVIL,ZESTRIL) 5 MG tablet Take 5 mg by mouth daily.    Marland Kitchen NOVOLIN 70/30 (70-30) 100 UNIT/ML injection Inject 55 Units into the skin daily with breakfast.     . omeprazole (PRILOSEC) 20 MG capsule Take 20 mg by mouth daily.     Marland Kitchen oxyCODONE (ROXICODONE) 5 MG immediate release tablet Take 1 tablet (5 mg total) by mouth every 6 (six) hours as needed for  severe pain. (Patient not taking: Reported on 10/24/2014) 30 tablet 0  . rosuvastatin (CRESTOR) 40 MG tablet Take 40 mg by mouth daily.     No current facility-administered medications for this visit.    Past Medical History  Diagnosis Date  . Hypercholesterolemia   . Diabetes mellitus   . Peripheral vascular disease   . Hypertension   . COPD (chronic obstructive pulmonary disease)     stopped smoking 5 years ago  . GERD (gastroesophageal reflux disease)     Social History Social History  Substance Use Topics  . Smoking status: Former Smoker -- 1.00 packs/day for 50 years    Types: Cigarettes    Quit date: 01/14/2009  . Smokeless tobacco: Never Used  . Alcohol Use: 5.4 oz/week    3 Glasses of wine, 3 Cans of beer, 3 Shots of liquor per week    Family History Family History  Problem Relation Age of Onset  . Hyperlipidemia Mother   . Cancer Mother   . Hyperlipidemia Father   . Cancer Father   . Diabetes Neg Hx     Surgical History Past Surgical History  Procedure Laterality Date  . Hernia repair    . Colonoscopy    . Colonoscopy w/ biopsies and polypectomy  2008  . Aorta - bilateral femoral artery bypass graft N/A 08/11/2013    Procedure: AORTA BIFEMORAL BYPASS GRAFT;  Surgeon: Nada Libman, MD;  Location:  [facility] OR;  Service: Vascular;  Laterality: N/A;    Allergies  Allergen Reactions  . Neomycin Other (See Comments)    unknown  . Tea Tree Oil Other (See Comments)    unknown  . Sulfa Antibiotics Other (See Comments)    unknown  . Other Rash    Chlorhexidine gluconate    Current Outpatient Prescriptions  Medication Sig Dispense Refill  . aspirin 81 MG tablet Take 81 mg by mouth daily.    . cilostazol (PLETAL) 50 MG tablet Take 50 mg by mouth 2 (two) times daily.     Marland Kitchen lisinopril (PRINIVIL,ZESTRIL) 5 MG tablet Take 5 mg by mouth daily.    Marland Kitchen NOVOLIN 70/30 (70-30) 100 UNIT/ML injection Inject 55 Units into the skin daily with breakfast.     . omeprazole  (PRILOSEC) 20 MG capsule Take 20 mg by mouth daily.     Marland Kitchen oxyCODONE (ROXICODONE) 5 MG immediate release tablet Take 1 tablet (5 mg total) by mouth every 6 (six) hours as needed for severe pain. (Patient not taking: Reported on 10/24/2014) 30 tablet 0  . rosuvastatin (CRESTOR) 40 MG tablet Take 40 mg by mouth daily.     No current facility-administered medications for this visit.     REVIEW OF SYSTEMS: See HPI for pertinent positives and negatives.  Physical Examination Filed Vitals:   04/03/15 1044 04/03/15 1046  BP: 134/69 128/63  Pulse: 74 74  Temp: 96.7 F (35.9 C)   Resp: 16   Height:  (1.676 m)   Weight: 155 lb (70.308 kg)   SpO2: 97%    Body mass index is 25.03 kg/(m^2).  General:  WDWN in NAD Gait: Normal HENT: WNL Eyes: Pupils equal Pulmonary: normal non-labored breathing , no rales, rhonchi, or wheezing Cardiac: RRR, no murmur detected  Abdomen: soft, NT, no masses palpated. CGM device in place [place]. Skin: no rashes, no ulcers, no cellulitis.   VASCULAR EXAM  Carotid Bruits Right Left   Negative positive                             Aorta is not palpable Radial pulses are 2+ palpable and =   VASCULAR EXAM: Extremities without ischemic changes, without Gangrene; no open wounds. Pitting edema in both lower legs: trace in right, 1+ in left                                                                                                          LE Pulses Right Left       FEMORAL  2+ palpable  2+ palpable        POPLITEAL  not palpable   not palpable       POSTERIOR TIBIAL  not palpable   1+ palpable        DORSALIS PEDIS      ANTERIOR TIBIAL not palpable  not palpable     Musculoskeletal: no muscle wasting or atrophy.  Neurologic: A&O X 3; Appropriate Affect ;  SENSATION: normal;  MOTOR FUNCTION: 5/5 Symmetric, CN 2-12 intact Speech is fluent/normal    Non-Invasive Vascular Imaging (04/03/2015):  ABI (Date: 04/03/2015)  R: 0.73 (0.70,  02/01/14), DP: biphasic, PT: triphasic, TBI: 0.44       L: 0.80 (0.81), DP: triphasic, PT: triphasic, TBI: 0.33   Aortoiliac duplex: Widely patent graft with no significant stenosis. Inflow aortic anastomosis could not be visualized due to bowel gas.  Pt was not NPO.   Carotid Duplex: <40% stenosis of the bilateral ICA. No previous exam for comparison.   ASSESSMENT:  Steven Sanders is a 75 y.o. male who is s/p aortobifemoral bypass graft using a 14 x 8 dacryon graft on 08/11/2013. This was done for lifestyle limiting claudication. He has a history of lower extremity ulceration which ultimately healed. e has a known right SFA occlusion.    He walks a mile in 20 minutes, 2-3 days/week. He rides a bike for a mile and does resistance exercises 2-3 days/week. He has no claudication sx's with walking, no signs of ischemia in his LE's.  He started taking the cilostazol 1.5 to 2 years before the aortobi fem bypass and is still taking the cilostazol. Will plan to wean off cilostizol at pt request since he has no claudication sx's after the above revascularization. I advised him to resume cilostizol if his claudication returns.   ABI's remain stable with moderate arterial occlusive disease in his right leg and mild in his left leg.  Aortoiliac duplex today suggests widely patent graft with no significant stenosis. Inflow aortic anastomosis could not be visualized due to bowel gas.  He has no hx of stroke or TIA.  Today's carotid duplex suggests minimal bilateral ICA stenosis.   His atherosclerotic risk factors include controlled juvenile DM and  former smoker.   Face to face time with patient was 25 minutes. Over 50% of this time was spent on counseling and coordination of care.   PLAN:   Continue fairly intensive exercise regimen.  Based on today's exam and non-invasive vascular lab results, the patient will follow up in 1 year with the following tests: carotid duplex, ABI's, and  bilateral aortoiliac duplex. I advised the pt to notify our office if his claudication worsens or he develops non healing wounds.  I discussed in depth with the patient the nature of atherosclerosis, and emphasized the importance of maximal medical management including strict control of blood pressure, blood glucose, and lipid levels, obtaining regular exercise, and cessation of smoking.  The patient is aware that without maximal medical management the underlying atherosclerotic disease process will progress, limiting the benefit of any interventions. . The patient was given information about stroke prevention and what symptoms should prompt the patient to seek immediate medical care.  The patient was given information about PAD including signs, symptoms, treatment, what symptoms should prompt the patient to seek immediate medical care, and risk reduction measures to take. Thank you for allowing [place] to participate in this patient's care.  Charisse March, RN, MSN, FNP-C [facility] Office: [phone]  Clinic MD: Edilia Bo  04/03/2015 10:08 AM

## 2015-04-04 NOTE — Addendum Note (Signed)
Addended by: Sharee Pimple on: 04/04/2015 03:42 PM   Modules accepted: Orders

## 2015-04-18 DIAGNOSIS — E1139 Type 2 diabetes mellitus with other diabetic ophthalmic complication: Secondary | ICD-10-CM | POA: Diagnosis not present

## 2015-04-18 DIAGNOSIS — E1149 Type 2 diabetes mellitus with other diabetic neurological complication: Secondary | ICD-10-CM | POA: Diagnosis not present

## 2015-04-18 DIAGNOSIS — E1129 Type 2 diabetes mellitus with other diabetic kidney complication: Secondary | ICD-10-CM | POA: Diagnosis not present

## 2015-04-18 DIAGNOSIS — I739 Peripheral vascular disease, unspecified: Secondary | ICD-10-CM | POA: Diagnosis not present

## 2015-04-18 DIAGNOSIS — G629 Polyneuropathy, unspecified: Secondary | ICD-10-CM | POA: Diagnosis not present

## 2015-04-18 DIAGNOSIS — E7800 Pure hypercholesterolemia, unspecified: Secondary | ICD-10-CM | POA: Diagnosis not present

## 2015-04-18 DIAGNOSIS — K219 Gastro-esophageal reflux disease without esophagitis: Secondary | ICD-10-CM | POA: Diagnosis not present

## 2015-04-18 DIAGNOSIS — R809 Proteinuria, unspecified: Secondary | ICD-10-CM | POA: Diagnosis not present

## 2015-04-18 DIAGNOSIS — E13319 Other specified diabetes mellitus with unspecified diabetic retinopathy without macular edema: Secondary | ICD-10-CM | POA: Diagnosis not present

## 2015-04-22 ENCOUNTER — Ambulatory Visit (INDEPENDENT_AMBULATORY_CARE_PROVIDER_SITE_OTHER): Payer: Commercial Managed Care - HMO | Admitting: Endocrinology

## 2015-04-22 ENCOUNTER — Encounter: Payer: Self-pay | Admitting: Endocrinology

## 2015-04-22 VITALS — BP 128/70 | HR 62 | Temp 97.6°F | Ht 66.0 in | Wt 156.0 lb

## 2015-04-22 DIAGNOSIS — E1051 Type 1 diabetes mellitus with diabetic peripheral angiopathy without gangrene: Secondary | ICD-10-CM | POA: Diagnosis not present

## 2015-04-22 NOTE — Progress Notes (Signed)
Subjective:    Patient ID: Steven Sanders, male    DOB: 27-Jan-1941, 75 y.o.   MRN: [id number]  HPI Pt returns for f/u of diabetes mellitus: DM type: 1 Dx'ed: 1960 Complications: polyneuropathy, nephropathy, retinopathy, and PAD Therapy: insulin since dx DKA: never Severe hypoglycemia: multiple episodes, most recently in 2015 Pancreatitis: never Other: he has declined multiple daily injections; he has a dexcom continuous glucose monitor Interval history: He takes 70/30, 60 units qam, and prn regular (only twice per week).  no cbg record, but states cbg's are mildly low almost daily.  he has not had any further severe hypoglycemia.  We reviewed continuous glucose monitor data together.  Almost all vary from 70-150.  It is lowest in the middle of the night.   Past Medical History  Diagnosis Date  . Hypercholesterolemia   . Diabetes mellitus   . Peripheral vascular disease (HCC)   . Hypertension   . COPD (chronic obstructive pulmonary disease) (HCC)     stopped smoking 5 years ago  . GERD (gastroesophageal reflux disease)     Past Surgical History  Procedure Laterality Date  . Hernia repair    . Colonoscopy    . Colonoscopy w/ biopsies and polypectomy  2008  . Aorta - bilateral femoral artery bypass graft N/A 08/11/2013    Procedure: AORTA BIFEMORAL BYPASS GRAFT;  Surgeon: Nada Libman, MD;  Location: [facility] OR;  Service: Vascular;  Laterality: N/A;    Social History   Social History  . Marital Status: Widowed    Spouse Name: N/A  . Number of Children: N/A  . Years of Education: N/A   Occupational History  . Not on file.   Social History Main Topics  . Smoking status: Former Smoker -- 1.00 packs/day for 50 years    Types: Cigarettes    Quit date: 01/14/2009  . Smokeless tobacco: Never Used  . Alcohol Use: 5.4 oz/week    3 Glasses of wine, 3 Cans of beer, 3 Shots of liquor per week  . Drug Use: No  . Sexual Activity: Not on file   Other Topics Concern  . Not on  file   Social History Narrative    Current Outpatient Prescriptions on File Prior to Visit  Medication Sig Dispense Refill  . aspirin 81 MG tablet Take 81 mg by mouth daily. Reported on 04/22/2015    . lisinopril (PRINIVIL,ZESTRIL) 5 MG tablet Take 5 mg by mouth daily.    Marland Kitchen NOVOLIN 70/30 (70-30) 100 UNIT/ML injection Inject 55 Units into the skin daily with breakfast.     . omeprazole (PRILOSEC) 20 MG capsule Take 20 mg by mouth daily. Reported on 04/03/2015    . rosuvastatin (CRESTOR) 40 MG tablet Take 40 mg by mouth daily.    . cilostazol (PLETAL) 50 MG tablet Take 50 mg by mouth 2 (two) times daily. Reported on 04/22/2015     No current facility-administered medications on file prior to visit.    Allergies  Allergen Reactions  . Neomycin Other (See Comments)    unknown  . Tea Tree Oil Other (See Comments)    unknown  . Sulfa Antibiotics Other (See Comments)    unknown  . Other Rash    Chlorhexidine gluconate    Family History  Problem Relation Age of Onset  . Hyperlipidemia Mother   . Cancer Mother   . Hyperlipidemia Father   . Cancer Father   . Diabetes Neg Hx     BP 128/70  mmHg  Pulse 62  Temp(Src) 97.6 F (36.4 C) (Oral)  Ht  (1.676 m)  Wt 156 lb (70.761 kg)  BMI 25.19 kg/m2  SpO2 92%   Review of Systems Denies weight change.      Objective:   Physical Exam VITAL SIGNS:  See vs page GENERAL: no distress Pulses: dorsalis pedis absent bilaterally.   MSK: no deformity of the feet CV: no leg edema Skin:  no ulcer on the feet.  normal color and temp on the feet. Neuro: sensation is intact to touch on the feet.    outside test results are reviewed: A1c=6.7%     Assessment & Plan:  DM: overcontrolled, given this regimen, which does match insulin to his changing needs throughout the day.  Patient is advised the following: Patient Instructions  check your blood sugar 4 times a day: before the 3 meals, and at bedtime.  also check if you have symptoms  of your blood sugar being too high or too low.  please keep a record of the readings and bring it to your next appointment here.  You can write it on any piece of paper.  please call us sooner if your blood sugar goes below 70, or if you have a lot of readings over 200.   Please reduce the insulin to 55 units with breakfast.   On this type of insulin schedule, you should eat meals on a regular schedule.  If a meal is missed or significantly delayed, your blood sugar could go low.  Please also minimize the regular insulin.   Please come back for a follow-up appointment in 3 months.

## 2015-04-22 NOTE — Patient Instructions (Addendum)
check your blood sugar 4 times a day: before the 3 meals, and at bedtime.  also check if you have symptoms of your blood sugar being too high or too low.  please keep a record of the readings and bring it to your next appointment here.  You can write it on any piece of paper.  please call us sooner if your blood sugar goes below 70, or if you have a lot of readings over 200.   Please reduce the insulin to 55 units with breakfast.   On this type of insulin schedule, you should eat meals on a regular schedule.  If a meal is missed or significantly delayed, your blood sugar could go low.  Please also minimize the regular insulin.   Please come back for a follow-up appointment in 3 months.

## 2015-05-03 ENCOUNTER — Encounter: Payer: Self-pay | Admitting: Family Medicine

## 2015-07-02 IMAGING — CT CT ANGIO AOBIFEM WO/W CM
1 of 14 series · 11 of 33 positions shown · IV contrast ([ID] [id number])
Comparison: CT ANGIO AOBIFEM W/CM &/OR WO/CM dated 08/31/2008

CLINICAL DATA: Peripheral vascular disease with increase in lower
extremity claudication known chronic occlusion of left-sided iliac
arteries.

EXAM:
CT ANGIOGRAPHY OF ABDOMINAL AORTA WITH ILIOFEMORAL RUNOFF
TECHNIQUE: Multidetector CT imaging of the abdomen, pelvis and lower
extremities was performed using the standard protocol during bolus
administration of intravenous contrast. Multiplanar CT image
reconstructions and MIPs were obtained to evaluate the vascular
anatomy.
CONTRAST:  150mL OMNIPAQUE IOHEXOL 350 MG/ML SOLN

[Series 5: runoff · axial · 0.62mm/px · z∈[-1254,-100]mm · 11 of 521 slices shown]
[im 44/521  soft-tissue]
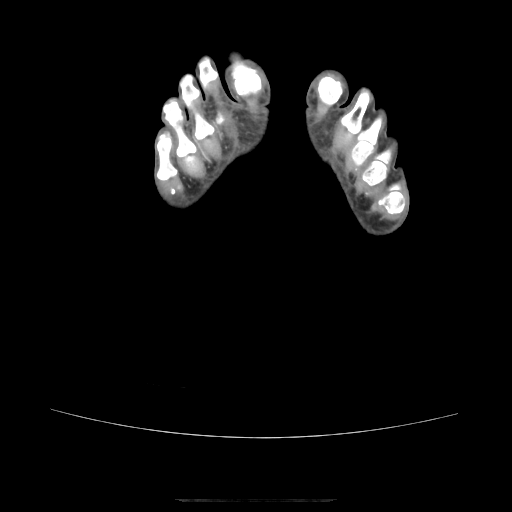
[im 87/521  bone]
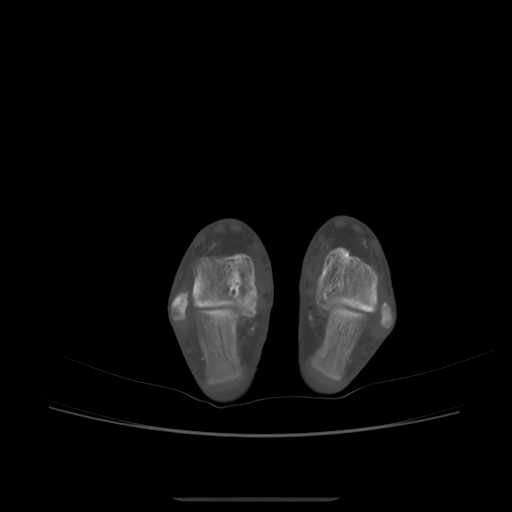
[im 131/521  soft-tissue]
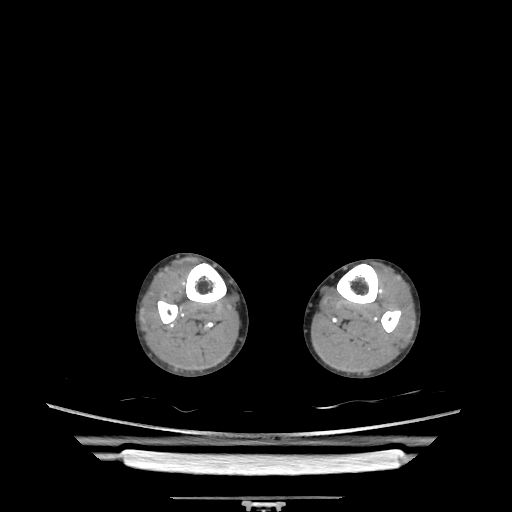
[im 174/521  bone]
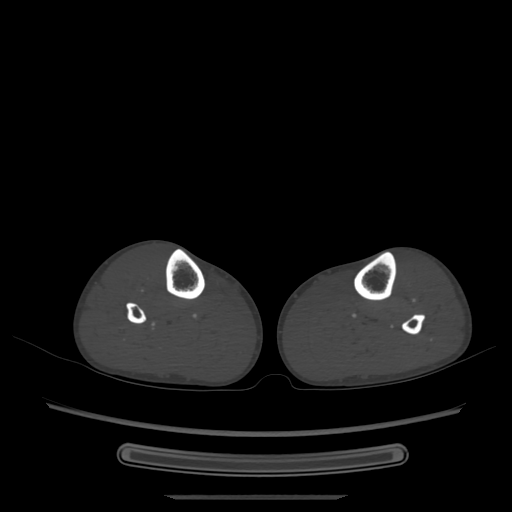
[im 217/521  soft-tissue]
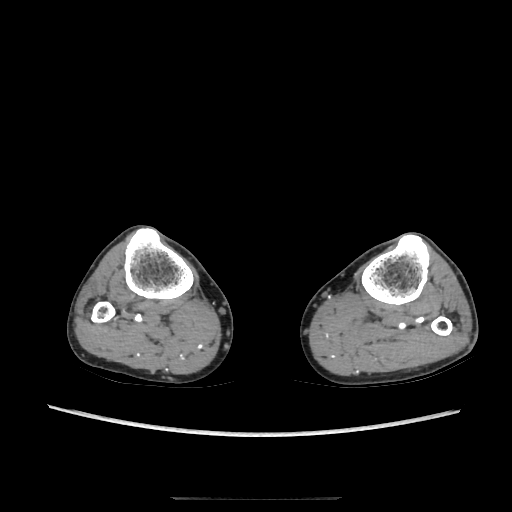
[im 261/521  bone]
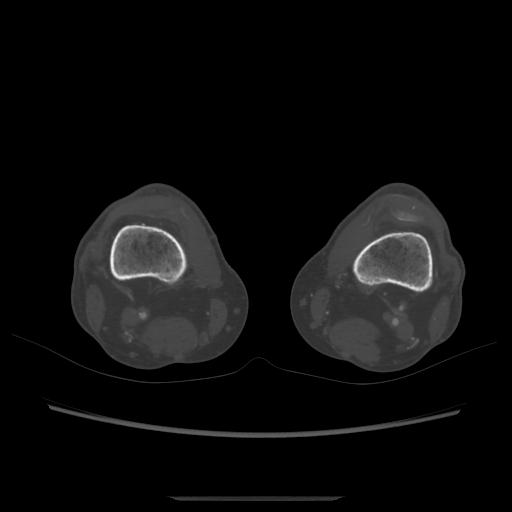
[im 304/521  soft-tissue]
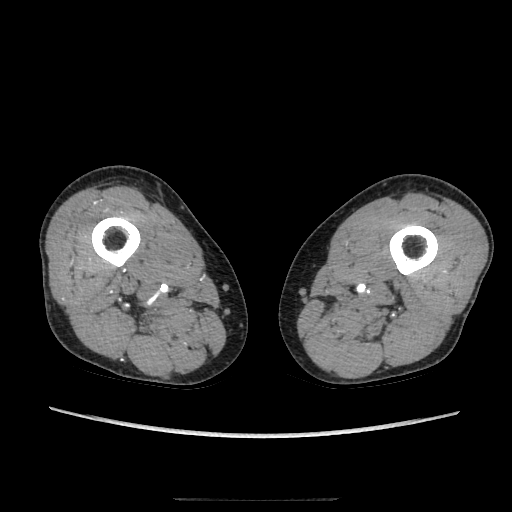
[im 347/521  bone]
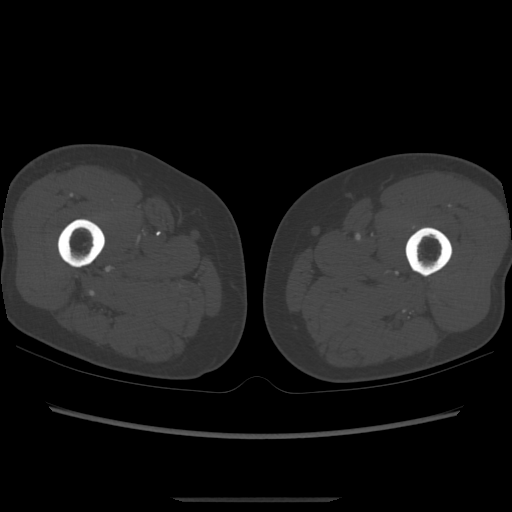
[im 391/521  soft-tissue]
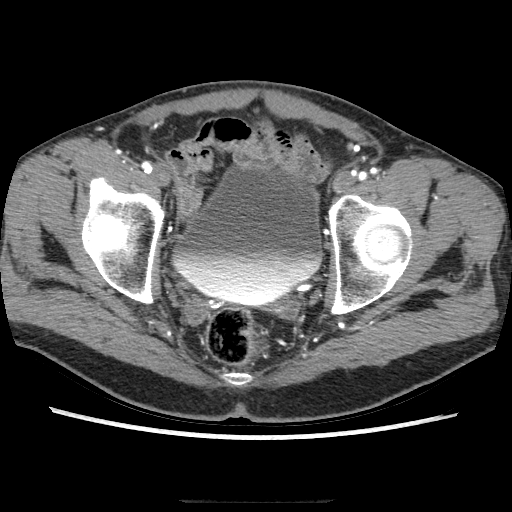
[im 434/521  bone]
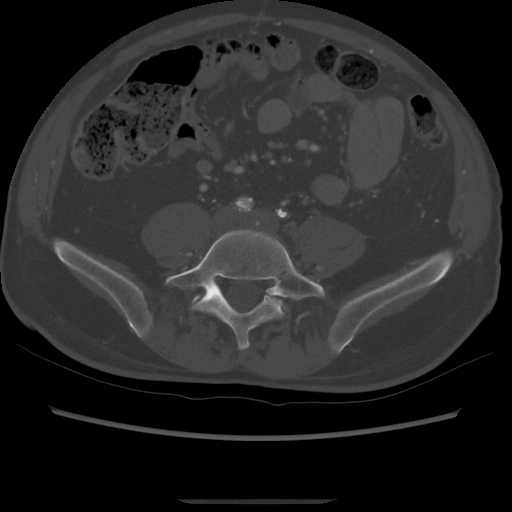
[im 477/521  soft-tissue]
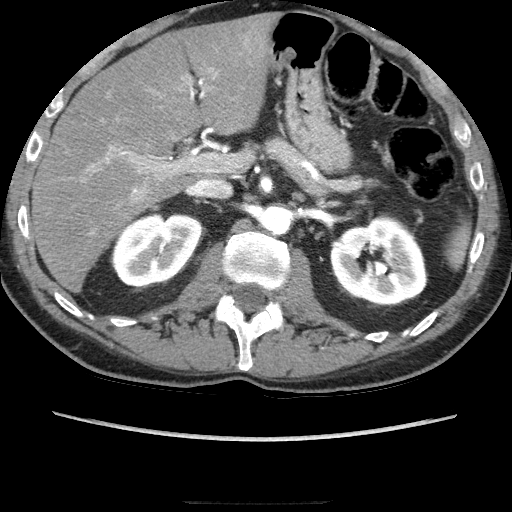

[11 of 33 positions shown; findings below may reference images not displayed]

FINDINGS: Aorta: Stable atherosclerosis of the abdominal aorta, primarily in
the infrarenal segment. No evidence of aortic aneurysm or stenosis.

Stable stenosis of the proximal celiac axis an a configuration
consistent with median arcuate ligament compression. The superior
mesenteric artery, bilateral single renal arteries and inferior
mesenteric artery remain normally patent. Partially calcified small
focal aneurysm of the anterior division branch of the left renal
artery measures 5 mm.

Right Lower Extremity: There is some progression of diffuse disease
of the common iliac artery. Heavily calcified plaque causes
approximately 80% narrowing of the mid common iliac artery. Diffuse
calcification of the proximal hypogastric trunk identified. The
right external iliac artery shows diffuse disease with some probable
progression in the mid and distal segments. Maximal degree of
narrowing of the external iliac artery is likely greater than 70%.
Heavily calcified plaque is again seen involving the distal aspect
of the common femoral artery. The femoral bifurcation is open.

Significant disease of the SFA again noted with stable chronic
occlusion at the juncture of proximal and mid thigh and distal
reconstitution at the adductor hiatus. Calcified plaque of the
popliteal artery at the level of the knee joint causes stable
roughly 60-70% stenosis. The popliteal artery below the knee shows
no significant stenosis. Three-vessel runoff is again demonstrated
into the foot with dominant posterior tibial runoff.

Left Lower Extremity: Stable chronic occlusions of the left common,
internal and external iliac arteries. Reconstitution of the distal
external iliac/ proximal common femoral artery again noted by
circumflex iliac and inferior epigastric vessels at the inguinal
ligament. There is some collateral reconstitution of distal internal
iliac branches on the left.

The femoral bifurcation is open. Scattered and diffuse disease of
the left SFA again noted with maximal narrowing of the distal SFA of
approximately 40- 50%. Heavily calcified plaque is again noted of
the popliteal artery above the knee which appears to have progressed
to likely a near critical stenosis. Below the knee, no significant
popliteal stenosis is identified. Three-vessel runoff is
demonstrated into the foot with dominant posterior tibial runoff.

Review of the MIP images confirms the above findings.

Venous structures show stable varicosities of both lower legs
communicating with the great saphenous veins bilaterally.
Nonvascular evaluation shows steatosis of the liver.
IMPRESSION: 1. Progressive atherosclerosis of the right common iliac artery with
maximal roughly 80% narrowing of the mid right common iliac artery.
Probable progression of diffuse disease of the right external iliac
artery also identified. Stable chronic right SFA occlusion with
distal thigh reconstitution. Stable popliteal stenosis at the knee
joint. Three-vessel runoff is demonstrated below the knee.
2. Stable chronic occlusions of the left common, internal and
external iliac arteries with collateral reconstitution of the distal
external iliac artery. Stable SFA disease on the left. Probable
progression of above knee popliteal disease on the left to near
critical stenosis. Three-vessel runoff is demonstrated below the
knee.

## 2015-07-22 ENCOUNTER — Ambulatory Visit: Payer: Commercial Managed Care - HMO | Admitting: Endocrinology

## 2015-07-29 ENCOUNTER — Ambulatory Visit: Payer: Commercial Managed Care - HMO | Admitting: Endocrinology

## 2015-08-05 ENCOUNTER — Encounter: Payer: Self-pay | Admitting: Cardiology

## 2015-08-15 ENCOUNTER — Ambulatory Visit: Payer: Commercial Managed Care - HMO | Admitting: Endocrinology

## 2015-08-15 DIAGNOSIS — E103291 Type 1 diabetes mellitus with mild nonproliferative diabetic retinopathy without macular edema, right eye: Secondary | ICD-10-CM | POA: Diagnosis not present

## 2015-08-15 DIAGNOSIS — H40013 Open angle with borderline findings, low risk, bilateral: Secondary | ICD-10-CM | POA: Diagnosis not present

## 2015-08-15 DIAGNOSIS — H35031 Hypertensive retinopathy, right eye: Secondary | ICD-10-CM | POA: Diagnosis not present

## 2015-08-15 DIAGNOSIS — E103292 Type 1 diabetes mellitus with mild nonproliferative diabetic retinopathy without macular edema, left eye: Secondary | ICD-10-CM | POA: Diagnosis not present

## 2015-08-19 ENCOUNTER — Encounter: Payer: Self-pay | Admitting: Endocrinology

## 2015-08-19 ENCOUNTER — Ambulatory Visit (INDEPENDENT_AMBULATORY_CARE_PROVIDER_SITE_OTHER): Payer: Commercial Managed Care - HMO | Admitting: Endocrinology

## 2015-08-19 VITALS — BP 143/64 | HR 73 | Temp 97.4°F | Ht 66.0 in | Wt 158.0 lb

## 2015-08-19 DIAGNOSIS — E1051 Type 1 diabetes mellitus with diabetic peripheral angiopathy without gangrene: Secondary | ICD-10-CM

## 2015-08-19 LAB — POCT GLYCOSYLATED HEMOGLOBIN (HGB A1C): HEMOGLOBIN A1C: 6.9

## 2015-08-19 NOTE — Progress Notes (Signed)
Subjective:    Patient ID: Steven Sanders, male    DOB: 07-26-1940, 75 y.o.   MRN: [id number]  HPI Pt returns for f/u of diabetes mellitus: DM type: 1 Dx'ed: 1960 Complications: polyneuropathy, nephropathy, retinopathy, and PAD Therapy: insulin since dx DKA: never Severe hypoglycemia: multiple episodes, most recently in 2015.  Pancreatitis: never Other: he has declined multiple daily injections; he has a dexcom continuous glucose monitor.   Interval history: He takes 70/30, 60 units qam, and prn regular (only twice per week).  no cbg record, but states cbg's are mildly low almost daily.  We reviewed continuous glucose monitor data together, on his device.  Almost all vary from 70-150.  It is lowest in the afternoon.   Past Medical History  Diagnosis Date  . Hypercholesterolemia   . Diabetes mellitus   . Peripheral vascular disease (HCC)   . Hypertension   . COPD (chronic obstructive pulmonary disease) (HCC)     stopped smoking 5 years ago  . GERD (gastroesophageal reflux disease)     Past Surgical History  Procedure Laterality Date  . Hernia repair    . Colonoscopy    . Colonoscopy w/ biopsies and polypectomy  2008  . Aorta - bilateral femoral artery bypass graft N/A 08/11/2013    Procedure: AORTA BIFEMORAL BYPASS GRAFT;  Surgeon: Nada LibmanVance W Brabham, MD;  Location: [facility] OR;  Service: Vascular;  Laterality: N/A;    Social History   Social History  . Marital Status: Widowed    Spouse Name: N/A  . Number of Children: N/A  . Years of Education: N/A   Occupational History  . Not on file.   Social History Main Topics  . Smoking status: Former Smoker -- 1.00 packs/day for 50 years    Types: Cigarettes    Quit date: 01/14/2009  . Smokeless tobacco: Never Used  . Alcohol Use: 5.4 oz/week    3 Glasses of wine, 3 Cans of beer, 3 Shots of liquor per week  . Drug Use: No  . Sexual Activity: Not on file   Other Topics Concern  . Not on file   Social History Narrative     Current Outpatient Prescriptions on File Prior to Visit  Medication Sig Dispense Refill  . aspirin 81 MG tablet Take 81 mg by mouth daily. Reported on 04/22/2015    . lisinopril (PRINIVIL,ZESTRIL) 5 MG tablet Take 5 mg by mouth daily.    Marland Kitchen. NOVOLIN 70/30 (70-30) 100 UNIT/ML injection Inject 50 Units into the skin daily with breakfast.     . omeprazole (PRILOSEC) 20 MG capsule Take 20 mg by mouth daily. Reported on 04/03/2015    . rosuvastatin (CRESTOR) 40 MG tablet Take 40 mg by mouth daily.    . cilostazol (PLETAL) 50 MG tablet Take 50 mg by mouth 2 (two) times daily. Reported on 08/19/2015     No current facility-administered medications on file prior to visit.    Allergies  Allergen Reactions  . Neomycin Other (See Comments)    unknown  . Tea Tree Oil Other (See Comments)    unknown  . Sulfa Antibiotics Other (See Comments)    unknown  . Other Rash    Chlorhexidine gluconate    Family History  Problem Relation Age of Onset  . Hyperlipidemia Mother   . Cancer Mother   . Hyperlipidemia Father   . Cancer Father   . Diabetes Neg Hx     BP 143/64 mmHg  Pulse 73  Temp(Src)  97.4 F (36.3 C) (Oral)  Ht  (1.676 m)  Wt 158 lb (71.668 kg)  BMI 25.51 kg/m2  SpO2 94%  Review of Systems Denies LOC    Objective:   Physical Exam VITAL SIGNS:  See vs page GENERAL: no distress Pulses: dorsalis pedis intact bilat.   MSK: no deformity of the feet CV: 1+ bilat leg edema Skin:  no ulcer on the feet.  normal color and temp on the feet. Neuro: sensation is intact to touch on the feet   A1c=6.9%    Assessment & Plan:  Type 1 DM: overcontrolled, given this regimen, which does match insulin to her changing needs throughout the day Hypoglycemia: in view of atherosclerosis, he should minimize this. HTN: with probable situational component.  Same rx for now  Patient is advised the following: Patient Instructions  check your blood sugar 4 times a day: before the 3 meals, and  at bedtime.  also check if you have symptoms of your blood sugar being too high or too low.  please keep a record of the readings and bring it to your next appointment here.  You can write it on any piece of paper.  please call us sooner if your blood sugar goes below 70, or if you have a lot of readings over 200.   Please reduce the insulin to 50 units with breakfast.   On this type of insulin schedule, you should eat meals on a regular schedule.  If a meal is missed or significantly delayed, your blood sugar could go low.  Please also minimize the regular insulin.   Please come back for a follow-up appointment in 4 months.    Romero Belling, MD

## 2015-08-19 NOTE — Patient Instructions (Addendum)
check your blood sugar 4 times a day: before the 3 meals, and at bedtime.  also check if you have symptoms of your blood sugar being too high or too low.  please keep a record of the readings and bring it to your next appointment here.  You can write it on any piece of paper.  please call us sooner if your blood sugar goes below 70, or if you have a lot of readings over 200.   Please reduce the insulin to 50 units with breakfast.   On this type of insulin schedule, you should eat meals on a regular schedule.  If a meal is missed or significantly delayed, your blood sugar could go low.  Please also minimize the regular insulin.   Please come back for a follow-up appointment in 4 months.

## 2015-09-05 IMAGING — CR DG CHEST 2V
2 series · 2 of 2 positions shown · non-contrast
Comparison: DG CHEST 2 VIEW dated 11/19/2011

CLINICAL DATA: COPD and hypertension and previous tobacco use

EXAM:
CHEST  2 VIEW

[w chest pa]
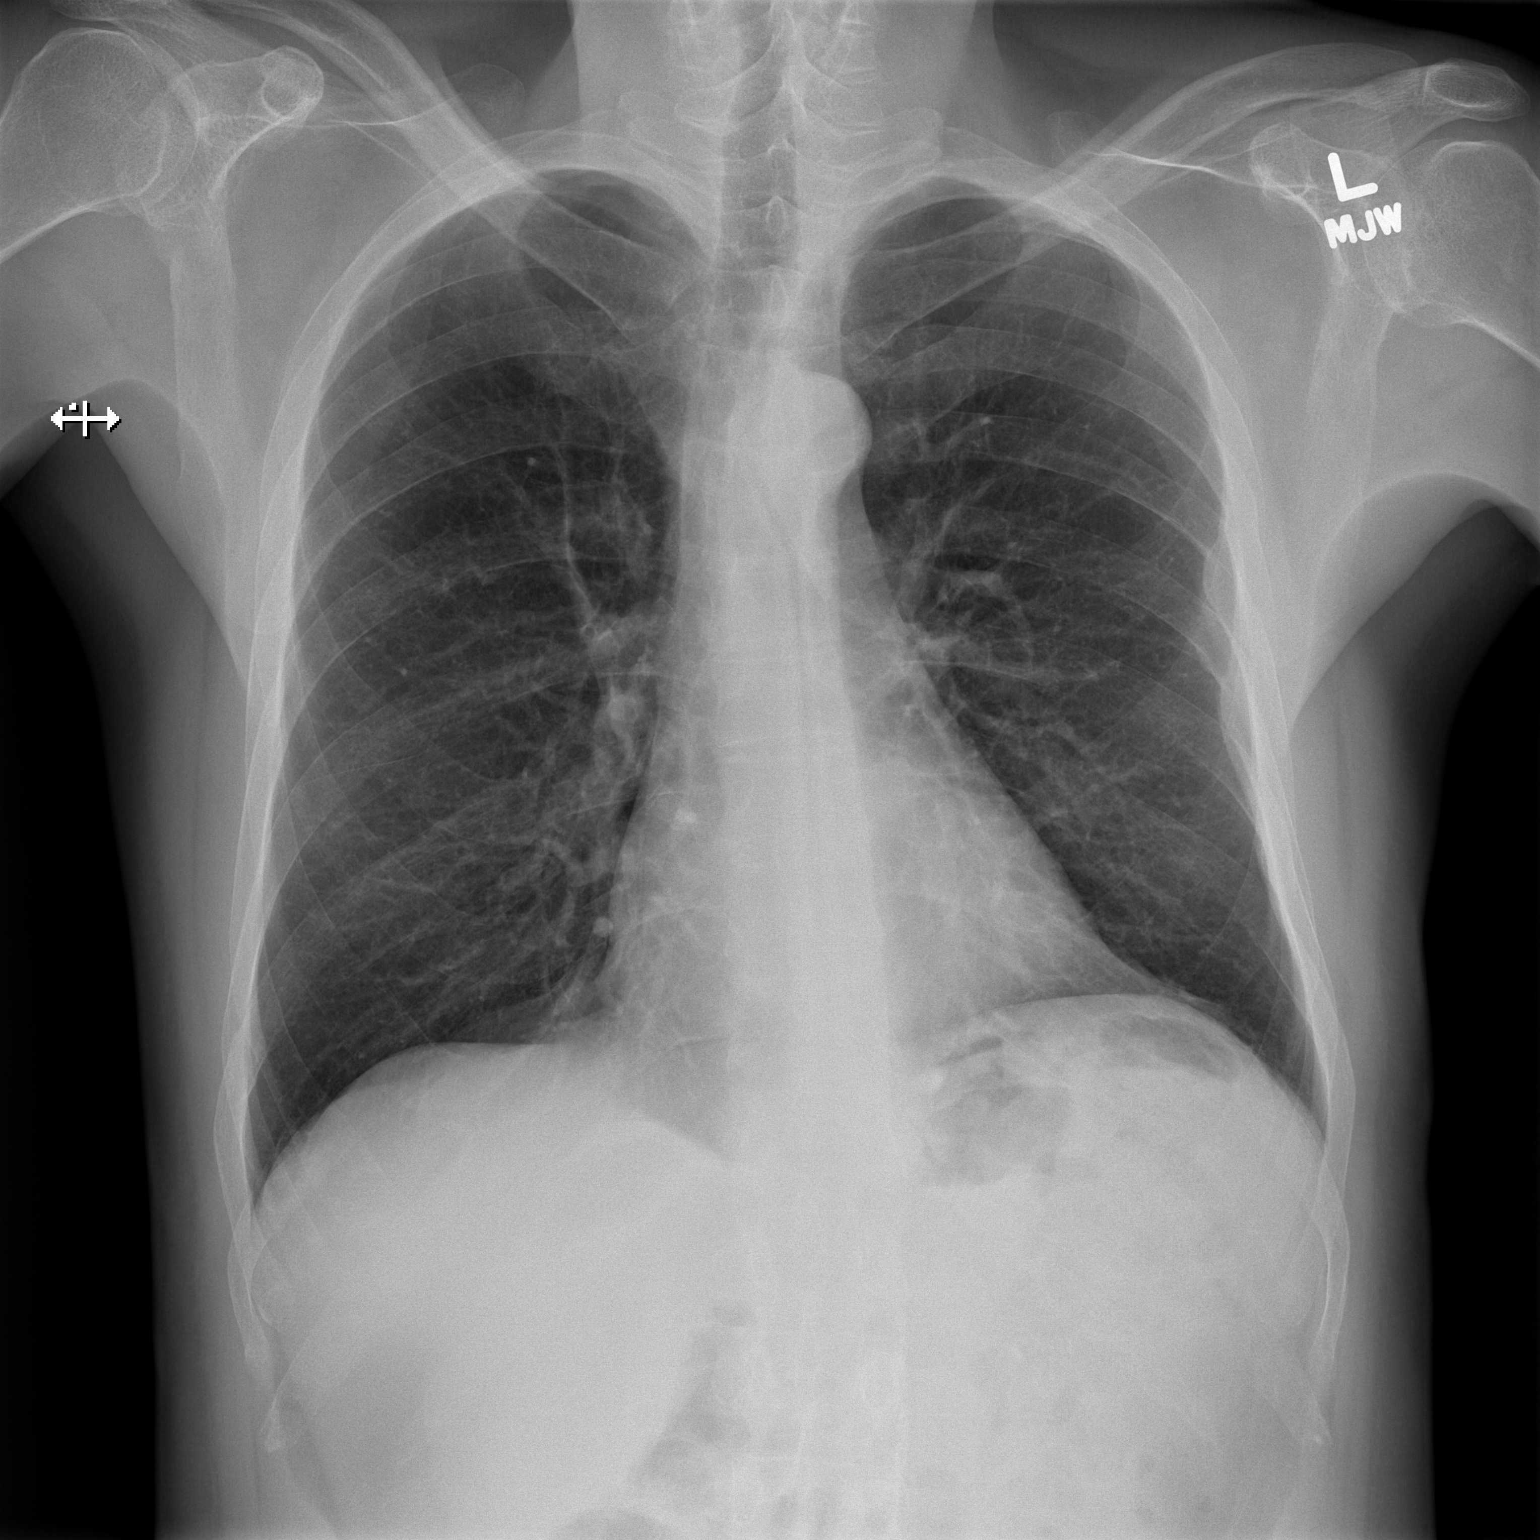

[w chest lat]
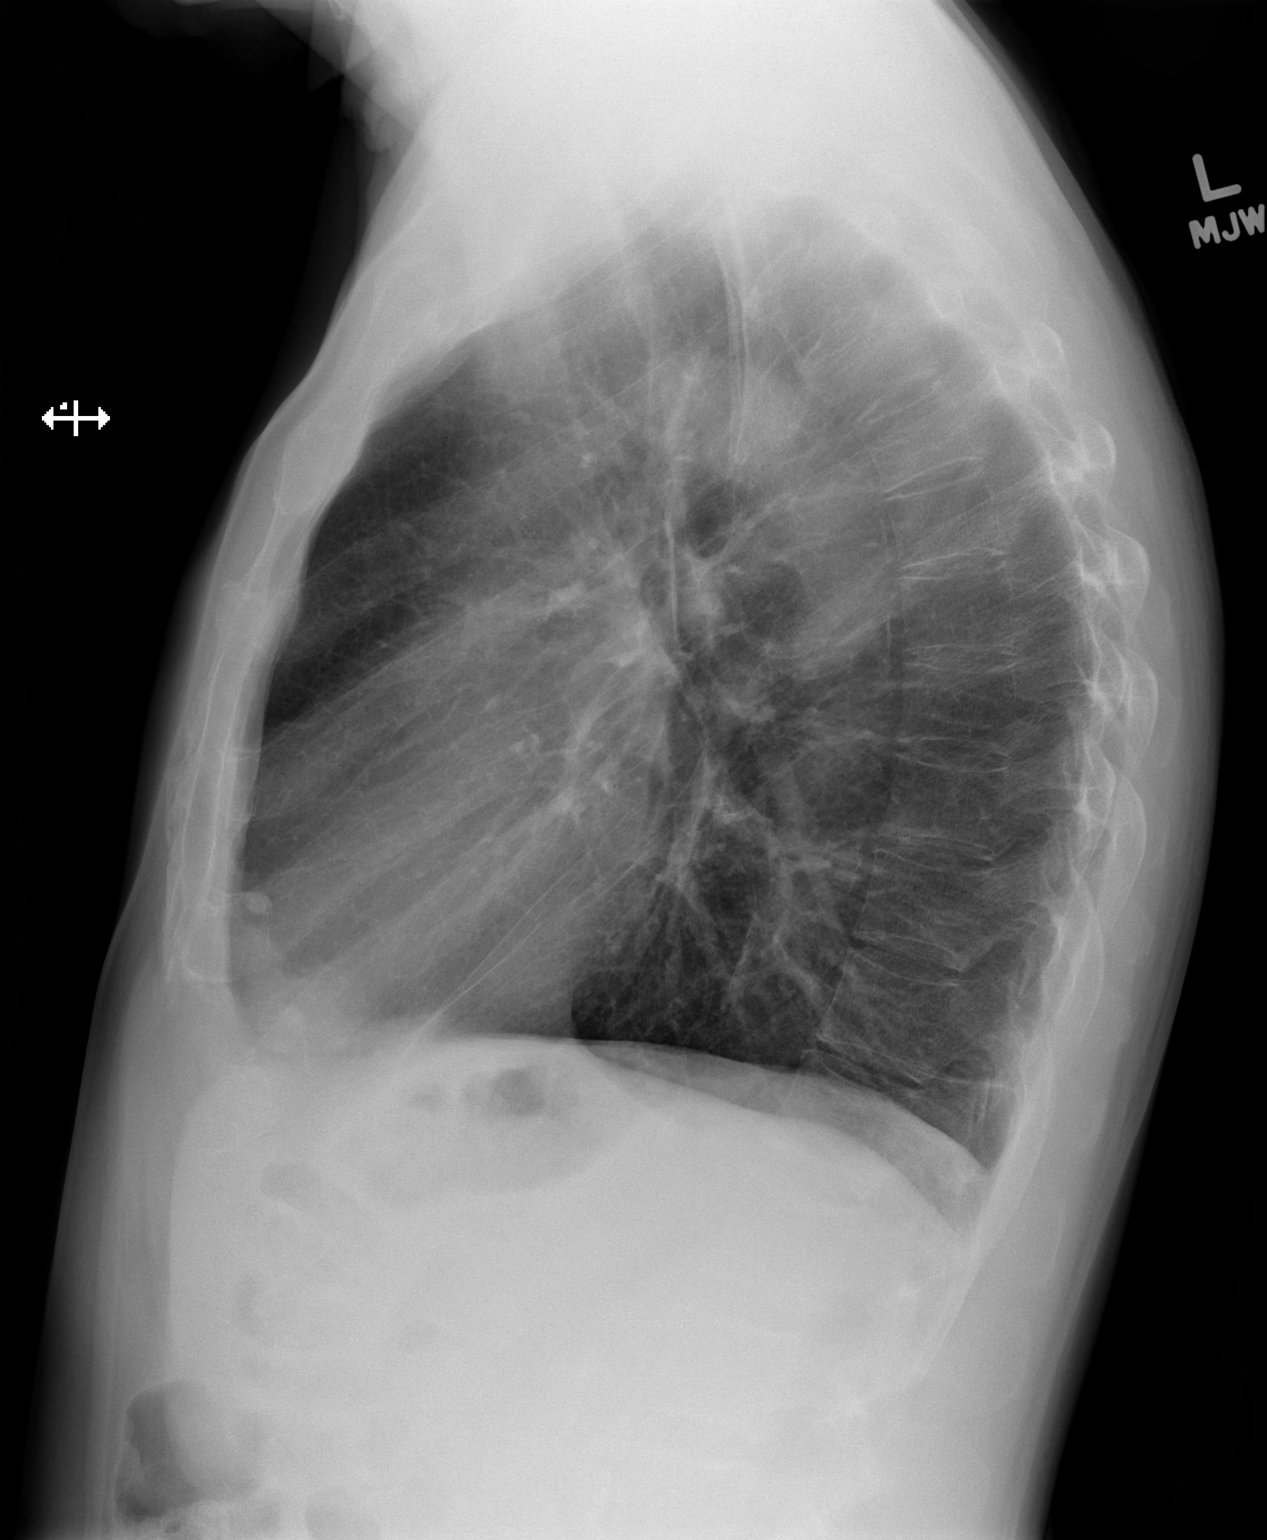

[2 of 2 positions shown; findings below may reference images not displayed]

FINDINGS: The heart size and mediastinal contours are within normal limits.
Both lungs are clear. There is old deformity of the lateral aspects
of the left fourth, fifth, and sixth ribs.
IMPRESSION: No active cardiopulmonary disease.

## 2015-10-22 DIAGNOSIS — G629 Polyneuropathy, unspecified: Secondary | ICD-10-CM | POA: Diagnosis not present

## 2015-10-22 DIAGNOSIS — D692 Other nonthrombocytopenic purpura: Secondary | ICD-10-CM | POA: Diagnosis not present

## 2015-10-22 DIAGNOSIS — Z794 Long term (current) use of insulin: Secondary | ICD-10-CM | POA: Diagnosis not present

## 2015-10-22 DIAGNOSIS — E1129 Type 2 diabetes mellitus with other diabetic kidney complication: Secondary | ICD-10-CM | POA: Diagnosis not present

## 2015-10-22 DIAGNOSIS — E13319 Other specified diabetes mellitus with unspecified diabetic retinopathy without macular edema: Secondary | ICD-10-CM | POA: Diagnosis not present

## 2015-10-22 DIAGNOSIS — E1139 Type 2 diabetes mellitus with other diabetic ophthalmic complication: Secondary | ICD-10-CM | POA: Diagnosis not present

## 2015-10-22 DIAGNOSIS — E7800 Pure hypercholesterolemia, unspecified: Secondary | ICD-10-CM | POA: Diagnosis not present

## 2015-10-22 DIAGNOSIS — E1149 Type 2 diabetes mellitus with other diabetic neurological complication: Secondary | ICD-10-CM | POA: Diagnosis not present

## 2015-10-22 DIAGNOSIS — R58 Hemorrhage, not elsewhere classified: Secondary | ICD-10-CM | POA: Diagnosis not present

## 2015-10-23 DIAGNOSIS — Z794 Long term (current) use of insulin: Secondary | ICD-10-CM | POA: Diagnosis not present

## 2015-10-23 DIAGNOSIS — E7800 Pure hypercholesterolemia, unspecified: Secondary | ICD-10-CM | POA: Diagnosis not present

## 2015-10-23 DIAGNOSIS — E1139 Type 2 diabetes mellitus with other diabetic ophthalmic complication: Secondary | ICD-10-CM | POA: Diagnosis not present

## 2015-10-23 DIAGNOSIS — R58 Hemorrhage, not elsewhere classified: Secondary | ICD-10-CM | POA: Diagnosis not present

## 2015-10-27 ENCOUNTER — Telehealth: Payer: Self-pay | Admitting: Endocrinology

## 2015-10-27 NOTE — Telephone Encounter (Signed)
please call patient: I received request for continuous glucose monitor. However, you do not qualify, as you take just 1 injection per day.

## 2015-10-28 NOTE — Telephone Encounter (Signed)
PT requests call back

## 2015-10-28 NOTE — Telephone Encounter (Signed)
I contacted the pt and left a vm advising of note below. Requested a call back if the pt would like to discuss.  

## 2015-10-29 NOTE — Telephone Encounter (Signed)
Ok, i'll do the form, as pt is already on the continuous glucose monitor.  I need it back from scanning pile, thanks. Please avoid the regular insulin, as it is not good to take with the insulin you take each morning.

## 2015-10-29 NOTE — Telephone Encounter (Signed)
PT called back again, requests call back as soon as possible.

## 2015-10-29 NOTE — Telephone Encounter (Signed)
I contacted the pt and advised of note below.  He stated he is using multiple injections per day. Pt stated he is taking  Novolin r 5 units per dose no more than 7 units per day and the novolin 70/30 insulin 45 units daily.  Please advise, Thanks!

## 2015-10-29 NOTE — Telephone Encounter (Signed)
I contacted the pt and advised of note below. Pt voiced understanding.  

## 2015-10-31 ENCOUNTER — Other Ambulatory Visit: Payer: Self-pay

## 2015-10-31 ENCOUNTER — Ambulatory Visit (INDEPENDENT_AMBULATORY_CARE_PROVIDER_SITE_OTHER): Payer: Commercial Managed Care - HMO | Admitting: Endocrinology

## 2015-10-31 ENCOUNTER — Telehealth: Payer: Self-pay | Admitting: Endocrinology

## 2015-10-31 ENCOUNTER — Encounter: Payer: Self-pay | Admitting: Endocrinology

## 2015-10-31 VITALS — BP 126/54 | HR 62 | Wt 150.0 lb

## 2015-10-31 DIAGNOSIS — E1051 Type 1 diabetes mellitus with diabetic peripheral angiopathy without gangrene: Secondary | ICD-10-CM

## 2015-10-31 LAB — POCT GLYCOSYLATED HEMOGLOBIN (HGB A1C): HEMOGLOBIN A1C: 7

## 2015-10-31 MED ORDER — "INSULIN SYRINGE 30G X 1/2"" 0.5 ML MISC": 2 refills | Status: DC

## 2015-10-31 NOTE — Patient Instructions (Signed)
check your blood sugar 4 times a day: before the 3 meals, and at bedtime.  also check if you have symptoms of your blood sugar being too high or too low.  please keep a record of the readings and bring it to your next appointment here.  You can write it on any piece of paper.  please call us sooner if your blood sugar goes below 70, or if you have a lot of readings over 200.  Please continue the same insulin for now.  On this type of insulin schedule, you should eat meals on a regular schedule.  If a meal is missed or significantly delayed, your blood sugar could go low.  Please also continue to minimize the regular insulin.   Please see Steven Sanders, to go over downloading your continuous glucose monitor information.  This is the next step in your diabetes management.   Please come back for a follow-up appointment in 3 months.

## 2015-10-31 NOTE — Progress Notes (Signed)
Subjective:    Patient ID: Steven Sanders, male    DOB: 02/17/1941, 75 y.o.   MRN: [id number]  HPI Pt returns for f/u of diabetes mellitus: DM type: 1 Dx'ed: 1960 Complications: polyneuropathy, nephropathy, retinopathy, and PAD.  Therapy: insulin since dx.   DKA: never. Severe hypoglycemia: multiple episodes, most recently in 2015.   Pancreatitis: never.  Other: he has declined multiple daily injections; he has a dexcom continuous glucose monitor.   Interval history: He takes 70/30, 50 units qam, and prn regular (approx 5 units qhs).   We reviewed continuous glucose monitor information together, but he has only 1 day.  It varies from  50-200.  pt states he feels well in general.  Past Medical History:  Diagnosis Date  . COPD (chronic obstructive pulmonary disease) (HCC)    stopped smoking 5 years ago  . Diabetes mellitus   . GERD (gastroesophageal reflux disease)   . Hypercholesterolemia   . Hypertension   . Peripheral vascular disease [facility](HCC)     Past Surgical History:  Procedure Laterality Date  . AORTA - BILATERAL FEMORAL ARTERY BYPASS GRAFT N/A 08/11/2013   Procedure: AORTA BIFEMORAL BYPASS GRAFT;  Surgeon: Nada LibmanVance W Brabham, MD;  Location: [facility] OR;  Service: Vascular;  Laterality: N/A;  . COLONOSCOPY    . COLONOSCOPY W/ BIOPSIES AND POLYPECTOMY  2008  . HERNIA REPAIR      Social History   Social History  . Marital status: Widowed    Spouse name: N/A  . Number of children: N/A  . Years of education: N/A   Occupational History  . Not on file.   Social History Main Topics  . Smoking status: Former Smoker    Packs/day: 1.00    Years: 50.00    Types: Cigarettes    Quit date: 01/14/2009  . Smokeless tobacco: Never Used  . Alcohol use 5.4 oz/week    3 Glasses of wine, 3 Cans of beer, 3 Shots of liquor per week  . Drug use: No  . Sexual activity: Not on file   Other Topics Concern  . Not on file   Social History Narrative  . No narrative on file    Current  Outpatient Prescriptions on File Prior to Visit  Medication Sig Dispense Refill  . aspirin 81 MG tablet Take 81 mg by mouth daily. Reported on 04/22/2015    . cilostazol (PLETAL) 50 MG tablet Take 50 mg by mouth 2 (two) times daily. Reported on 08/19/2015    . lisinopril (PRINIVIL,ZESTRIL) 5 MG tablet Take 5 mg by mouth daily.    Marland Kitchen. NOVOLIN 70/30 (70-30) 100 UNIT/ML injection Inject 50 Units into the skin daily with breakfast.     . omeprazole (PRILOSEC) 20 MG capsule Take 20 mg by mouth daily. Reported on 04/03/2015    . rosuvastatin (CRESTOR) 40 MG tablet Take 40 mg by mouth daily.     No current facility-administered medications on file prior to visit.     Allergies  Allergen Reactions  . Neomycin Other (See Comments)    unknown  . Tea Tree Oil Other (See Comments)    unknown  . Sulfa Antibiotics Other (See Comments)    unknown  . Other Rash    Chlorhexidine gluconate    Family History  Problem Relation Age of Onset  . Hyperlipidemia Mother   . Cancer Mother   . Hyperlipidemia Father   . Cancer Father   . Diabetes Neg Hx     BP Marland Kitchen(!)  126/54   Pulse 62   Wt 150 lb (68 kg)   BMI 24.21 kg/m   Review of Systems He denies LOC.  He has lost a few lbs.     Objective:   Physical Exam VITAL SIGNS:  See vs page GENERAL: no distress Pulses: dorsalis pedis intact bilat.   MSK: no deformity of the feet CV: 1+ bilat leg edema.   Skin:  no ulcer on the feet.  normal color and temp on the feet.   Neuro: sensation is intact to touch on the feet.     A1c=7.0%    Assessment & Plan:  Type 1 DM: overcontrolled.

## 2015-10-31 NOTE — Telephone Encounter (Signed)
PT is needing 1/2ML syringes sent into [facility].

## 2015-10-31 NOTE — Telephone Encounter (Signed)
Rx submitted per pt's request.  

## 2015-11-19 ENCOUNTER — Encounter: Payer: Commercial Managed Care - HMO | Attending: Endocrinology | Admitting: Nutrition

## 2015-11-19 DIAGNOSIS — E1051 Type 1 diabetes mellitus with diabetic peripheral angiopathy without gangrene: Secondary | ICD-10-CM | POA: Insufficient documentation

## 2015-11-19 DIAGNOSIS — Z713 Dietary counseling and surveillance: Secondary | ICD-10-CM | POA: Diagnosis not present

## 2015-11-19 NOTE — Progress Notes (Signed)
He was shown how to download his G5 sensor and how to download Dexcom Clarity to his computer at home.  He was afraid that if he sent his readings to his phone, that Medicare will not pay for it.  He actually had the printed form from Medicare that said that same thing.  I told him that I would contact the Dexcom Representative and let him know what that means.  I [place] a call to [place], but have not heard from her.   He reported good understanding of all of the above and had no final questions.   CGM download shows that he is having lows durint the night from MN to 3AM.  His blood sugars starts to climb at this time and rises to about 180 acB at 10AM.  Pt. Takes his insulin 70/30 at 9AM, and does not want to reduce his insulin dose, because he is afraid his FBS will be higher He eats 2 meals/day at 11:30AM, and then at 5:30-6:30PM. PC readings after bfast and lunch are WNL, and his lows at 4PM are reactive to his activity level that afternoon.  He will eat at HS snack if HS readings are below 120.     I suggested that he try to move his AM dose of insulin to a little earlier-maybe at 8:00, to try to dampen that rise every morning.  CGM sent to Dr. Everardo AllEllison.

## 2015-11-19 NOTE — Patient Instructions (Signed)
Try taking AM dose of insulin 1 hour earlier.  If blood sugars drop before breakfast, move the dose back to 9AM.

## 2016-01-29 ENCOUNTER — Telehealth: Payer: Self-pay | Admitting: Endocrinology

## 2016-01-29 NOTE — Telephone Encounter (Signed)
Pt has a question about his Dexcom and would like a call back from [name].

## 2016-01-31 ENCOUNTER — Ambulatory Visit: Payer: Commercial Managed Care - HMO | Admitting: Endocrinology

## 2016-02-04 ENCOUNTER — Telehealth: Payer: Self-pay | Admitting: Endocrinology

## 2016-02-04 NOTE — Telephone Encounter (Signed)
Requested a call back from the patient to further discuss.  

## 2016-02-04 NOTE — Telephone Encounter (Signed)
Pt called requesting to speak with Megan, did not specify why.

## 2016-02-05 MED ORDER — "INSULIN SYRINGE 30G X 1/2"" 0.5 ML MISC": 2 refills | Status: DC

## 2016-02-05 NOTE — Telephone Encounter (Signed)
I contacted the patient and he advised me the refill for his insulin syringe needed be submitted to [facility]. Refill submitted.

## 2016-02-05 NOTE — Telephone Encounter (Signed)
Patient is returning your call.  

## 2016-02-05 NOTE — Telephone Encounter (Signed)
Requested a call back from the patient to discuss further.  

## 2016-02-05 NOTE — Telephone Encounter (Signed)
Patient ask you to call him on this number [phone]  Please call before you leave today.

## 2016-02-10 MED ORDER — "INSULIN SYRINGE 30G X 5/16"" 0.5 ML MISC": 2 refills | Status: DC

## 2016-02-10 NOTE — Telephone Encounter (Signed)
I contacted the patient and advised the address given in the message needs to be given to [facility] in order for him to receive his insulin syringe. Pateint voiced understanding and stated he would call the pharmacy and update the address.

## 2016-02-10 NOTE — Telephone Encounter (Signed)
Patient need the Insulin Syringe-Needle the a box of 8 mm long. Send to   [employer]  Website  Directions  [address], [place], [place] [id number]

## 2016-02-10 NOTE — Telephone Encounter (Signed)
Patient has questions about a letter he received from [facility], and his eligibility for medicare paying for his Dexcom.  I did not have the answers that he was needing, and so he was given the area representitives' name and number:  Buena IrishKacey Wood.  He agreed to call her with the questions

## 2016-02-19 ENCOUNTER — Telehealth: Payer: Self-pay | Admitting: Endocrinology

## 2016-02-19 NOTE — Telephone Encounter (Signed)
Pt called in asking to [facility], requests call back.

## 2016-02-19 NOTE — Telephone Encounter (Signed)
Aside from the university, I am unaware.

## 2016-02-19 NOTE — Telephone Encounter (Signed)
I contacted the patient and he stated he has a brother who thinks he may have pre diabetes and wanted to know if you had a recommendation [place]?

## 2016-02-20 NOTE — Telephone Encounter (Signed)
I contacted the patient and advised of message. Patient voiced understanding and had no further questions,

## 2016-02-26 ENCOUNTER — Ambulatory Visit: Payer: Commercial Managed Care - HMO | Admitting: Endocrinology

## 2016-03-22 NOTE — Progress Notes (Signed)
Subjective:    Patient ID: Steven Sanders, male    DOB: 06-Feb-1941, 76 y.o.   MRN: [id number]  HPI Pt returns for f/u of diabetes mellitus: DM type: 1 Dx'ed: 1960 Complications: polyneuropathy, nephropathy, retinopathy, and PAD.  Therapy: insulin since dx.   DKA: never. Severe hypoglycemia: multiple episodes, most recently in 2015.   Pancreatitis: never.  Other: he has declined multiple daily injections; he has a dexcom-5 continuous glucose monitor.   Interval history: He takes 70/30, 35 units qam, and prn regular (seldom takes).   We reviewed continuous glucose monitor information together It varies from  55-200's.  It is lowest after lunch (his first meal of the day).  pt states he feels well in general.   Pt states moderate coldness of the hands, but no assoc numbness.   Past Medical History:  Diagnosis Date  . COPD (chronic obstructive pulmonary disease) (HCC)    stopped smoking 5 years ago  . Diabetes mellitus   . GERD (gastroesophageal reflux disease)   . Hypercholesterolemia   . Hypertension   . Peripheral vascular disease [facility](HCC)     Past Surgical History:  Procedure Laterality Date  . AORTA - BILATERAL FEMORAL ARTERY BYPASS GRAFT N/A 08/11/2013   Procedure: AORTA BIFEMORAL BYPASS GRAFT;  Surgeon: Nada LibmanVance W Brabham, MD;  Location: [facility] OR;  Service: Vascular;  Laterality: N/A;  . COLONOSCOPY    . COLONOSCOPY W/ BIOPSIES AND POLYPECTOMY  2008  . HERNIA REPAIR      Social History   Social History  . Marital status: Widowed    Spouse name: N/A  . Number of children: N/A  . Years of education: N/A   Occupational History  . Not on file.   Social History Main Topics  . Smoking status: Former Smoker    Packs/day: 1.00    Years: 50.00    Types: Cigarettes    Quit date: 01/14/2009  . Smokeless tobacco: Never Used  . Alcohol use 5.4 oz/week    3 Glasses of wine, 3 Cans of beer, 3 Shots of liquor per week  . Drug use: No  . Sexual activity: Not on file   Other  Topics Concern  . Not on file   Social History Narrative  . No narrative on file    Current Outpatient Prescriptions on File Prior to Visit  Medication Sig Dispense Refill  . aspirin 81 MG tablet Take 81 mg by mouth daily. Reported on 04/22/2015    . insulin regular (NOVOLIN R,HUMULIN R) 100 units/mL injection Inject 5 Units into the skin daily.    . Insulin Syringe-Needle U-100 (INSULIN SYRINGE .5CC/30GX1/2") 30G X 1/2" 0.5 ML MISC Use to inject insulin 2 times per day. 200 each 2  . Insulin Syringe-Needle U-100 (INSULIN SYRINGE .5CC/30GX5/16") 30G X 5/16" 0.5 ML MISC Use to inject insulin 2 times per day. DISPENSE THE 8 MM SIZE 200 each 2  . lisinopril (PRINIVIL,ZESTRIL) 5 MG tablet Take 5 mg by mouth daily.    Marland Kitchen. omeprazole (PRILOSEC) 20 MG capsule Take 20 mg by mouth daily. Reported on 04/03/2015    . rosuvastatin (CRESTOR) 40 MG tablet Take 40 mg by mouth daily.     No current facility-administered medications on file prior to visit.     Allergies  Allergen Reactions  . Neomycin Other (See Comments)    unknown  . Tea Tree Oil Other (See Comments)    unknown  . Sulfa Antibiotics Other (See Comments)    unknown  .  Other Rash    Chlorhexidine gluconate    Family History  Problem Relation Age of Onset  . Hyperlipidemia Mother   . Cancer Mother   . Hyperlipidemia Father   . Cancer Father   . Diabetes Neg Hx     BP 136/62   Pulse 64   Ht 5\' 6"  (1.676 m)   Wt 140 lb (63.5 kg)   SpO2 97%   BMI 22.60 kg/m   Review of Systems Denies LOC    Objective:   Physical Exam VITAL SIGNS:  See vs page GENERAL: no distress Pulses: dorsalis pedis absent bilat.  Radials are intact bilaterally MSK: no deformity of the feet or hands CV: no leg edema.   Skin:  no ulcer on the feet.  normal color and temp on the feet and hands Neuro: sensation is intact to touch on the feet and hands   A1c=7.0%    Assessment & Plan:  Cold intolerance of hands, new Type 1 DM, with PAD:  slightly overcontrolled, given this regimen, which does match insulin to her changing needs throughout the day Patient is advised the following: Patient Instructions  check your blood sugar 4 times a day: before the 3 meals, and at bedtime.  also check if you have symptoms of your blood sugar being too high or too low.  please keep a record of the readings and bring it to your next appointment here.  You can write it on any piece of paper.  please call us sooner if your blood sugar goes below 70, or if you have a lot of readings over 200.  Please continue the same insulin to 32 units with the first meal of the day.  On this type of insulin schedule, you should eat meals on a regular schedule.  If a meal is missed or significantly delayed, your blood sugar could go low.  Please also continue to minimize the regular insulin.   Please see Bonita Quin, to go over downloading your continuous glucose monitor information.  This is the next step in your diabetes management.   Let's check an ultrasound of the arteries of the arms.  you will receive a phone call, about a day and time for an appointment.   Please come back for a follow-up appointment in 3 months.

## 2016-03-25 ENCOUNTER — Encounter: Payer: Self-pay | Admitting: Endocrinology

## 2016-03-25 ENCOUNTER — Ambulatory Visit (INDEPENDENT_AMBULATORY_CARE_PROVIDER_SITE_OTHER): Payer: Commercial Managed Care - HMO | Admitting: Endocrinology

## 2016-03-25 VITALS — BP 136/62 | HR 64 | Ht 66.0 in | Wt 140.0 lb

## 2016-03-25 DIAGNOSIS — I739 Peripheral vascular disease, unspecified: Secondary | ICD-10-CM

## 2016-03-25 DIAGNOSIS — E1051 Type 1 diabetes mellitus with diabetic peripheral angiopathy without gangrene: Secondary | ICD-10-CM | POA: Diagnosis not present

## 2016-03-25 LAB — POCT GLYCOSYLATED HEMOGLOBIN (HGB A1C): Hemoglobin A1C: 7

## 2016-03-25 MED ORDER — INSULIN NPH ISOPHANE & REGULAR (70-30) 100 UNIT/ML ~~LOC~~ SUSP: 32.0000 [IU] | Freq: Every day | SUBCUTANEOUS | 11 refills | Status: DC

## 2016-03-25 NOTE — Patient Instructions (Addendum)
check your blood sugar 4 times a day: before the 3 meals, and at bedtime.  also check if you have symptoms of your blood sugar being too high or too low.  please keep a record of the readings and bring it to your next appointment here.  You can write it on any piece of paper.  please call us sooner if your blood sugar goes below 70, or if you have a lot of readings over 200.  Please continue the same insulin to 32 units with the first meal of the day.  On this type of insulin schedule, you should eat meals on a regular schedule.  If a meal is missed or significantly delayed, your blood sugar could go low.  Please also continue to minimize the regular insulin.   Please see Steven Sanders, to go over downloading your continuous glucose monitor information.  This is the next step in your diabetes management.   Let's check an ultrasound of the arteries of the arms.  you will receive a phone call, about a day and time for an appointment.   Please come back for a follow-up appointment in 3 months.

## 2016-04-07 ENCOUNTER — Encounter: Payer: Self-pay | Admitting: Family

## 2016-04-07 ENCOUNTER — Telehealth: Payer: Self-pay | Admitting: Endocrinology

## 2016-04-07 NOTE — Telephone Encounter (Signed)
I contacted the patient and he wanted to confirm if we had received his dexcom form. I advised we received the form today and will be faxed back once MD sign's patient voiced understanding.

## 2016-04-07 NOTE — Telephone Encounter (Signed)
Pt [facility], did not specify a reasoning.

## 2016-04-13 ENCOUNTER — Ambulatory Visit (HOSPITAL_COMMUNITY)
Admission: RE | Admit: 2016-04-13 | Discharge: 2016-04-13 | Disposition: A | Discharge status: Home / Self Care | Payer: Medicare HMO | Source: Ambulatory Visit | Attending: Family | Admitting: Family

## 2016-04-13 ENCOUNTER — Ambulatory Visit (INDEPENDENT_AMBULATORY_CARE_PROVIDER_SITE_OTHER)
Admission: RE | Admit: 2016-04-13 | Discharge: 2016-04-13 | Disposition: A | Discharge status: Home / Self Care | Payer: Medicare HMO | Source: Ambulatory Visit | Attending: Family | Admitting: Family

## 2016-04-13 ENCOUNTER — Ambulatory Visit (INDEPENDENT_AMBULATORY_CARE_PROVIDER_SITE_OTHER): Payer: Medicare HMO | Admitting: Family

## 2016-04-13 ENCOUNTER — Encounter: Payer: Self-pay | Admitting: Family

## 2016-04-13 VITALS — BP 128/54 | HR 52 | Temp 96.9°F | Resp 18 | Ht 66.0 in | Wt 141.0 lb

## 2016-04-13 DIAGNOSIS — Z8679 Personal history of other diseases of the circulatory system: Secondary | ICD-10-CM | POA: Diagnosis not present

## 2016-04-13 DIAGNOSIS — Z95828 Presence of other vascular implants and grafts: Secondary | ICD-10-CM | POA: Diagnosis not present

## 2016-04-13 DIAGNOSIS — R0989 Other specified symptoms and signs involving the circulatory and respiratory systems: Secondary | ICD-10-CM | POA: Insufficient documentation

## 2016-04-13 DIAGNOSIS — Z87891 Personal history of nicotine dependence: Secondary | ICD-10-CM

## 2016-04-13 NOTE — Progress Notes (Signed)
[facility] [place] HISTORY AND PHYSICAL   MRN : [id number]  History of Present Illness:   Steven Sanders is a 76 y.o. male patient of Dr. Myra GianottiBrabham who is s/p aortobifemoral bypass graft using a 14 x 8 dacron graft on 08/11/2013. This was done for lifestyle limiting claudication. He has a history of lower extremity ulceration which ultimately healed. He has a known right SFA occlusion.   He walks a mile in 20 minutes, 2-3 days/week. He is riding a stationary bike for a mile and does resistance exercises 2-3 days/week. He has a treadmill and stationary bike at home.  He denies claudication sx's with walking, denies non healing wounds in his lower extremities.   The patient denies any history of TIA or stroke symptoms, specifically the patient denies a history of amaurosis fugax or monocular blindness, unilateral facial drooping, hemiplegia, and denies a history or expressive aphasia.     The patient has not had back or abdominal pain.  Pt Diabetic: Yes, diagnosed at age 76, 7.0% A1C on 03-25-16 (review of records). He wears a continuous glucose monitor.  Pt smoker: former smoker, quit in 2010  Pt meds include: Statin :Yes Betablocker: No ASA: Yes Other anticoagulants/antiplatelets: cilostazol stopped since he was not having any claudication sx's with or w/o use.    Current Outpatient Prescriptions  Medication Sig Dispense Refill  . aspirin 81 MG tablet Take 81 mg by mouth daily. Reported on 04/22/2015    . insulin NPH-regular Human (NOVOLIN 70/30) (70-30) 100 UNIT/ML injection Inject 32 Units into the skin daily with breakfast. 10 mL 11  . insulin regular (NOVOLIN R,HUMULIN R) 100 units/mL injection Inject 5 Units into the skin daily.    . Insulin Syringe-Needle U-100 (INSULIN SYRINGE .5CC/30GX1/2") 30G X 1/2" 0.5 ML MISC Use to inject insulin 2 times per day. 200 each 2  . Insulin Syringe-Needle U-100 (INSULIN SYRINGE .5CC/30GX5/16") 30G X 5/16" 0.5 ML MISC  Use to inject insulin 2 times per day. DISPENSE THE 8 MM SIZE 200 each 2  . lisinopril (PRINIVIL,ZESTRIL) 5 MG tablet Take 5 mg by mouth daily.    . pantoprazole (PROTONIX) 40 MG tablet     . rosuvastatin (CRESTOR) 40 MG tablet Take 40 mg by mouth daily.     No current facility-administered medications for this visit.     Past Medical History:  Diagnosis Date  . COPD (chronic obstructive pulmonary disease) (HCC)    stopped smoking 5 years ago  . Diabetes mellitus   . GERD (gastroesophageal reflux disease)   . Hypercholesterolemia   . Hypertension   . Peripheral vascular disease [facility](HCC)     Social History Social History  Substance Use Topics  . Smoking status: Former Smoker    Packs/day: 1.00    Years: 50.00    Types: Cigarettes    Quit date: 01/14/2009  . Smokeless tobacco: Never Used  . Alcohol use 5.4 oz/week    3 Glasses of wine, 3 Cans of beer, 3 Shots of liquor per week    Family History Family History  Problem Relation Age of Onset  . Hyperlipidemia Mother   . Cancer Mother   . Hyperlipidemia Father   . Cancer Father   . Diabetes Neg Hx     Surgical History Past Surgical History:  Procedure Laterality Date  . AORTA - BILATERAL FEMORAL ARTERY BYPASS GRAFT N/A 08/11/2013   Procedure: AORTA BIFEMORAL BYPASS GRAFT;  Surgeon: Nada LibmanVance W Brabham, MD;  Location: [facility] OR;  Service: Vascular;  Laterality: N/A;  . COLONOSCOPY    . COLONOSCOPY W/ BIOPSIES AND POLYPECTOMY  2008  . HERNIA REPAIR      Allergies  Allergen Reactions  . Neomycin Other (See Comments)    unknown  . Tea Tree Oil Other (See Comments)    unknown  . Sulfa Antibiotics Other (See Comments)    unknown  . Other Rash    Chlorhexidine gluconate    Current Outpatient Prescriptions  Medication Sig Dispense Refill  . aspirin 81 MG tablet Take 81 mg by mouth daily. Reported on 04/22/2015    . insulin NPH-regular Human (NOVOLIN 70/30) (70-30) 100 UNIT/ML injection Inject 32 Units into the skin daily with  breakfast. 10 mL 11  . insulin regular (NOVOLIN R,HUMULIN R) 100 units/mL injection Inject 5 Units into the skin daily.    . Insulin Syringe-Needle U-100 (INSULIN SYRINGE .5CC/30GX1/2") 30G X 1/2" 0.5 ML MISC Use to inject insulin 2 times per day. 200 each 2  . Insulin Syringe-Needle U-100 (INSULIN SYRINGE .5CC/30GX5/16") 30G X 5/16" 0.5 ML MISC Use to inject insulin 2 times per day. DISPENSE THE 8 MM SIZE 200 each 2  . lisinopril (PRINIVIL,ZESTRIL) 5 MG tablet Take 5 mg by mouth daily.    . pantoprazole (PROTONIX) 40 MG tablet     . rosuvastatin (CRESTOR) 40 MG tablet Take 40 mg by mouth daily.     No current facility-administered medications for this visit.      REVIEW OF SYSTEMS: See HPI for pertinent positives and negatives.  Physical Examination Vitals:   04/13/16 1050 04/13/16 1052  BP: (!) 124/52 (!) 128/54  Pulse: (!) 58 (!) 52  Resp:  18  Temp:  (!) 96.9 F (36.1 C)  TempSrc:  Oral  Weight:  141 lb (64 kg)  Height:  5\' 6"  (1.676 m)   Body mass index is 22.76 kg/m.  General:  WDWN male in NAD Gait: Normal HENT: WNL Eyes: Pupils equal Pulmonary: normal non-labored breathing, no rales, rhonchi, or wheezing Cardiac: RRR, no murmur detected  Abdomen: soft, NT, no masses palpated. CGM device in place [place]. Skin: no rashes, no ulcers, no cellulitis.   VASCULAR EXAM  Carotid Bruits Right Left   Negative negative                            Aorta is not palpable Radial pulses are 2+ palpable and =   VASCULAR EXAM: Extremities without ischemic changes, without Gangrene; no open wounds. Hand are cool and ruddy with some blanching, with sluggish capillary refills at all fingertips.  LE Pulses Right Left       FEMORAL  3+ palpable  3+ palpable        POPLITEAL  not palpable   not palpable       POSTERIOR TIBIAL   not palpable   not palpable        DORSALIS PEDIS      ANTERIOR TIBIAL not palpable  not palpable     Musculoskeletal: no muscle wasting or atrophy.         Neurologic: A&O X 3; Appropriate Affect ;  SENSATION: normal; MOTOR FUNCTION: 5/5 Symmetric, CN 2-12 intact Speech is fluent/normal      ASSESSMENT:  Steven Sanders is a 76 y.o. male who is s/p aortobifemoral bypass graft using a 14 x 8 dacryon graft on 08/11/2013. This was done for lifestyle limiting claudication. He has a history of lower extremity ulceration which ultimately healed. He has a known right SFA occlusion.    He walks a mile in 20 minutes, 2-3 days/week. He rides a bike for a mile and does resistance exercises 2-3 days/week. He has no claudication sx's with walking, no signs of ischemia in his LE's.   He has no hx of stroke or TIA.    His atherosclerotic risk factors include controlled Type 1 DM and  former smoker.    DATA (04-13-16) ABI's: Right: 0.77 (slightly improved from 0.73 on 04-03-15), biphasic waveforms; TBI: 0.43 (was 0.44) Left: 0.68 (declined from 0.80), monophasic waveforms; TBI: 0.30 (was 0.33)  Aortoiliac duplex today suggests decreased visualization of the abdominal vasculature due to overlying bowel gas. Patent aorto-bifemoral bypass graft without evidence of restenosis; however, the left distal anastomosis velocity (180 cm/s) is somewhat elevated without disease observed.  Carotid duplex suggests <40% bilateral ICA stenosis. Bilateral vertebral artery flow is antegrade.  Bilateral subclavian artery waveforms are normal.  No significant change compared to the last exam on 04-03-15.   PLAN:   Continue fairly intensive exercise regimen.  Based on today's exam and non-invasive vascular lab results, and after discussing with Dr. Myra Gianotti, the patient will follow up in 6 months with the following tests:  ABI's and bilateral LE arterial duplex in 6 months, bilateral aortoiliac  duplex in 1 year; carotid duplex in 2 years.  Apparently Dr. Everardo All requested bilateral UE arterial duplex for Raynaud's sx's in both hands, this is apparently scheduled for February 2018. I advised the pt to notify our office if his claudication worsens or he develops non healing wounds.  I discussed in depth with the patient the nature of atherosclerosis, and emphasized the importance of maximal medical management including strict control of blood pressure, blood glucose, and lipid levels, obtaining regular exercise, and cessation of smoking.  The patient is aware that without maximal medical management the underlying atherosclerotic disease process will progress, limiting the benefit of any interventions.  The patient was given information about stroke prevention and what symptoms should prompt the patient to seek immediate medical care.  The patient was given information about PAD including signs, symptoms, treatment, what symptoms should prompt the patient to seek immediate medical care, and risk reduction measures to take. Thank you for allowing [place] to participate in this patient's care.  Charisse March, RN, MSN, FNP-C [facility] Office: [phone]  Clinic MD: Myra Gianotti 04/13/2016 11:08 AM

## 2016-04-13 NOTE — Patient Instructions (Signed)

## 2016-04-14 MED ORDER — CILOSTAZOL 100 MG PO TABS: 100.0000 mg | ORAL_TABLET | Freq: Two times a day (BID) | ORAL | 3 refills | Status: DC

## 2016-04-14 NOTE — Addendum Note (Signed)
Addended by: Sharee PimpleMCCHESNEY, MARILYN K on: 04/14/2016 10:31 AM   Modules accepted: Orders

## 2016-04-15 NOTE — Addendum Note (Signed)
Addended by: Burton ApleyPETTY, Dezman Granda A on: 04/15/2016 04:38 PM   Modules accepted: Orders

## 2016-04-23 ENCOUNTER — Ambulatory Visit (HOSPITAL_COMMUNITY)
Admission: RE | Admit: 2016-04-23 | Discharge: 2016-04-23 | Disposition: A | Discharge status: Home / Self Care | Payer: Medicare HMO | Source: Ambulatory Visit | Attending: Endocrinology | Admitting: Endocrinology

## 2016-04-23 DIAGNOSIS — I739 Peripheral vascular disease, unspecified: Secondary | ICD-10-CM | POA: Diagnosis not present

## 2016-04-27 DIAGNOSIS — H01003 Unspecified blepharitis right eye, unspecified eyelid: Secondary | ICD-10-CM | POA: Diagnosis not present

## 2016-04-27 DIAGNOSIS — H02833 Dermatochalasis of right eye, unspecified eyelid: Secondary | ICD-10-CM | POA: Diagnosis not present

## 2016-04-27 DIAGNOSIS — H40013 Open angle with borderline findings, low risk, bilateral: Secondary | ICD-10-CM | POA: Diagnosis not present

## 2016-05-01 ENCOUNTER — Telehealth: Payer: Self-pay | Admitting: Endocrinology

## 2016-05-01 NOTE — Telephone Encounter (Signed)
Pt [facility] in [facility] with [facility]. Requests call back.

## 2016-05-01 NOTE — Telephone Encounter (Signed)
Patient ask you to call her as soon as you get out of the room, he know what you need to send concerning his insurance.

## 2016-05-01 NOTE — Telephone Encounter (Signed)
I contacted the patient. Patient currently use a dexcom 24 hr monitor and will be receiving a new device in a few weeks. Pateint wanted to advise me dexcom needed the last 6 months office notes to dispense his dexcom device. Patient stated he did not have any concerns with his insurance at this time. Office notes faxed to [phone] attention [place].

## 2016-05-11 ENCOUNTER — Telehealth: Payer: Self-pay | Admitting: Endocrinology

## 2016-05-11 NOTE — Telephone Encounter (Signed)
Requested a call back from [place] to further discuss.

## 2016-05-11 NOTE — Telephone Encounter (Signed)
Pt states dexcom has not received anything that we have sent to them, he just got off the phone with the rep. The person he has been talking to is [facility] Hutchens.

## 2016-05-11 NOTE — Telephone Encounter (Signed)
The phone number for jessica at dexcom is #[phone]

## 2016-05-11 NOTE — Telephone Encounter (Signed)
Requested a call back from Jessica to further discuss.  

## 2016-05-13 DIAGNOSIS — E7800 Pure hypercholesterolemia, unspecified: Secondary | ICD-10-CM | POA: Diagnosis not present

## 2016-05-13 DIAGNOSIS — G629 Polyneuropathy, unspecified: Secondary | ICD-10-CM | POA: Diagnosis not present

## 2016-05-13 DIAGNOSIS — E1139 Type 2 diabetes mellitus with other diabetic ophthalmic complication: Secondary | ICD-10-CM | POA: Diagnosis not present

## 2016-05-13 DIAGNOSIS — I739 Peripheral vascular disease, unspecified: Secondary | ICD-10-CM | POA: Diagnosis not present

## 2016-05-13 DIAGNOSIS — E1129 Type 2 diabetes mellitus with other diabetic kidney complication: Secondary | ICD-10-CM | POA: Diagnosis not present

## 2016-05-13 DIAGNOSIS — R809 Proteinuria, unspecified: Secondary | ICD-10-CM | POA: Diagnosis not present

## 2016-05-13 DIAGNOSIS — E1149 Type 2 diabetes mellitus with other diabetic neurological complication: Secondary | ICD-10-CM | POA: Diagnosis not present

## 2016-05-13 DIAGNOSIS — E13319 Other specified diabetes mellitus with unspecified diabetic retinopathy without macular edema: Secondary | ICD-10-CM | POA: Diagnosis not present

## 2016-05-13 DIAGNOSIS — Z794 Long term (current) use of insulin: Secondary | ICD-10-CM | POA: Diagnosis not present

## 2016-06-03 DIAGNOSIS — E1051 Type 1 diabetes mellitus with diabetic peripheral angiopathy without gangrene: Secondary | ICD-10-CM | POA: Diagnosis not present

## 2016-06-11 ENCOUNTER — Ambulatory Visit (INDEPENDENT_AMBULATORY_CARE_PROVIDER_SITE_OTHER): Payer: Medicare HMO | Admitting: Physician Assistant

## 2016-06-11 VITALS — BP 128/55 | HR 66 | Temp 97.8°F | Resp 16 | Ht 66.0 in | Wt 138.2 lb

## 2016-06-11 DIAGNOSIS — S01512A Laceration without foreign body of oral cavity, initial encounter: Secondary | ICD-10-CM | POA: Diagnosis not present

## 2016-06-11 MED ORDER — TRIAMCINOLONE ACETONIDE 0.1 % MT PSTE: 1.0000 "application " | PASTE | Freq: Two times a day (BID) | OROMUCOSAL | 0 refills | Status: DC

## 2016-06-11 NOTE — Progress Notes (Signed)
PRIMARY CARE AT [facility] [address] [place] [address] [phone]  Date:  06/11/2016   Name:  Steven Sanders   DOB:  17-Sep-1940   MRN:  [id number]  PCP:  Thora Lance, MD    History of Present Illness:  Steven Sanders is a 76 y.o. male patient who presents to PCP with  Chief Complaint  Patient presents with  . Oral Pain    sore spot on inside left on the gum line x 2-3 weeks     Bottom gum swelling that is painful and soreness.  He attempted orajel and cloves, which takes the pain temporarily.  Less painful now.  No hx of biting down hard or trauma to this area.   He has had the dentures for about 4-5 years.   He has a Education officer, community.   He does eat hardened foods, and may chew on a chicken bone.     Patient Active Problem List   Diagnosis Date Noted  . PVD (peripheral vascular disease) (HCC) 01/22/2014  . Diabetes mellitus type 1 with peripheral artery disease (HCC) 12/14/2013  . Aorto-iliac disease (HCC) 08/11/2013  . Atherosclerosis of native arteries of the extremities with ulceration(440.23) 01/23/2013  . Peripheral vascular disease, unspecified 12/12/2012  . Atherosclerosis of native arteries of the extremities with intermittent claudication 10/05/2011    Past Medical History:  Diagnosis Date  . COPD (chronic obstructive pulmonary disease) (HCC)    stopped smoking 5 years ago  . Diabetes mellitus   . GERD (gastroesophageal reflux disease)   . Hypercholesterolemia   . Hypertension   . Peripheral vascular disease [facility])     Past Surgical History:  Procedure Laterality Date  . AORTA - BILATERAL FEMORAL ARTERY BYPASS GRAFT N/A 08/11/2013   Procedure: AORTA BIFEMORAL BYPASS GRAFT;  Surgeon: Nada Libman, MD;  Location: [facility] OR;  Service: Vascular;  Laterality: N/A;  . COLONOSCOPY    . COLONOSCOPY W/ BIOPSIES AND POLYPECTOMY  2008  . HERNIA REPAIR      Social History  Substance Use Topics  . Smoking status: Former Smoker    Packs/day: 1.00    Years:  50.00    Types: Cigarettes    Quit date: 01/14/2009  . Smokeless tobacco: Never Used  . Alcohol use 5.4 oz/week    3 Glasses of wine, 3 Cans of beer, 3 Shots of liquor per week    Family History  Problem Relation Age of Onset  . Hyperlipidemia Mother   . Cancer Mother   . Hyperlipidemia Father   . Cancer Father   . Diabetes Neg Hx     Allergies  Allergen Reactions  . Neomycin Other (See Comments)    unknown  . Tea Tree Oil Other (See Comments)    unknown  . Sulfa Antibiotics Other (See Comments)    unknown  . Other Rash    Chlorhexidine gluconate    Medication list has been reviewed and updated.  Current Outpatient Prescriptions on File Prior to Visit  Medication Sig Dispense Refill  . aspirin 81 MG tablet Take 81 mg by mouth daily. Reported on 04/22/2015    . cilostazol (PLETAL) 100 MG tablet Take 1 tablet (100 mg total) by mouth 2 (two) times daily before a meal. 180 tablet 3  . insulin NPH-regular Human (NOVOLIN 70/30) (70-30) 100 UNIT/ML injection Inject 32 Units into the skin daily with breakfast. 10 mL 11  . insulin regular (NOVOLIN R,HUMULIN R) 100 units/mL injection Inject 5 Units into the  skin daily.    . Insulin Syringe-Needle U-100 (INSULIN SYRINGE .5CC/30GX1/2") 30G X 1/2" 0.5 ML MISC Use to inject insulin 2 times per day. 200 each 2  . Insulin Syringe-Needle U-100 (INSULIN SYRINGE .5CC/30GX5/16") 30G X 5/16" 0.5 ML MISC Use to inject insulin 2 times per day. DISPENSE THE 8 MM SIZE 200 each 2  . lisinopril (PRINIVIL,ZESTRIL) 5 MG tablet Take 5 mg by mouth daily.    . pantoprazole (PROTONIX) 40 MG tablet     . rosuvastatin (CRESTOR) 40 MG tablet Take 40 mg by mouth daily.     No current facility-administered medications on file prior to visit.     ROS ROS otherwise unremarkable unless listed above.  Physical Examination: BP (!) 128/55 (BP Location: Right Arm, Patient Position: Sitting, Cuff Size: Small)   Pulse 66   Temp 97.8 F (36.6 C) (Oral)   Resp 16    Ht 5\' 6"  (1.676 m)   Wt 138 lb 3.2 oz (62.7 kg)   SpO2 98%   BMI 22.31 kg/m  Ideal Body Weight: Weight in (lb) to have BMI = 25: 154.6  Physical Exam  Constitutional: He is oriented to person, place, and time. He appears well-developed and well-nourished. No distress.  HENT:  Head: Normocephalic and atraumatic.  Left sided lower gum with .5cm laceration without erythema or drainage.  Mild tenderness in this area.    Eyes: Conjunctivae and EOM are normal. Pupils are equal, round, and reactive to light.  Cardiovascular: Normal rate.   Pulmonary/Chest: Effort normal. No respiratory distress.  Neurological: He is alert and oriented to person, place, and time.  Skin: Skin is warm and dry. He is not diaphoretic.  Psychiatric: He has a normal mood and affect. His behavior is normal.   Denture has hardened calcifications along the inside of the denture where the gum and the denture meet.     Assessment and Plan: Catha NottinghamMarshall J Sanders is a 76 y.o. male who is here today for mouth lesion. --possible that he had a calcification or sharp debris that was lodged and lacerated the gum.  --orabase and/steroid cream along the gum of the  --advised to follow up with his dentist.   Laceration of oral cavity, initial encounter - Plan: triamcinolone (KENALOG) 0.1 % paste  Trena PlattStephanie Anani Gu, PA-C [facility] 3/29/20186:38 PM

## 2016-06-11 NOTE — Patient Instructions (Addendum)
Please see a dentist.    IF you received an x-ray today, you will receive an invoice from [facility]. Please contact [facility] at [phone] with questions or concerns regarding your invoice.   IF you received labwork today, you will receive an invoice from [facility]. Please contact [facility] at [phone] with questions or concerns regarding your invoice.   Our billing staff will not be able to assist you with questions regarding bills from these companies.  You will be contacted with the lab results as soon as they are available. The fastest way to get your results is to activate your My Chart account. Instructions are located on the last page of this paperwork. If you have not heard from us regarding the results in 2 weeks, please contact this office.

## 2016-06-23 ENCOUNTER — Encounter: Payer: Self-pay | Admitting: Endocrinology

## 2016-06-23 ENCOUNTER — Telehealth: Payer: Self-pay

## 2016-06-23 ENCOUNTER — Ambulatory Visit (INDEPENDENT_AMBULATORY_CARE_PROVIDER_SITE_OTHER): Payer: Medicare HMO | Admitting: Endocrinology

## 2016-06-23 VITALS — BP 122/84 | HR 72 | Ht 66.0 in | Wt 140.0 lb

## 2016-06-23 DIAGNOSIS — E1051 Type 1 diabetes mellitus with diabetic peripheral angiopathy without gangrene: Secondary | ICD-10-CM

## 2016-06-23 LAB — POCT GLYCOSYLATED HEMOGLOBIN (HGB A1C): Hemoglobin A1C: 6.5

## 2016-06-23 NOTE — Telephone Encounter (Signed)
Patient called and wanted to know what reference you were using when he was advised that his A1C at 6.5 was too low. Patient stated the reading material he had been looking at states a A1C at a 6.5 is a normal a1c? Please advise, Thanks!

## 2016-06-23 NOTE — Telephone Encounter (Signed)
The a1c goal should be individualized for each person. In your situation, 6.5 is too low.  Please adjust the insulin as we discussed.

## 2016-06-23 NOTE — Progress Notes (Signed)
Subjective:    Patient ID: Steven Sanders, male    DOB: 1940-10-30, 76 y.o.   MRN: [id number]  HPI Pt returns for f/u of diabetes mellitus: DM type: 1 Dx'ed: 1960 Complications: polyneuropathy, nephropathy, retinopathy, and PAD.  Therapy: insulin since dx.   DKA: never. Severe hypoglycemia: multiple episodes, most recently in 2015.   Pancreatitis: never.  Other: he has declined multiple daily injections; he has a dexcom-5 continuous glucose monitor.   Interval history: He takes 70/30, 35 units qam, and prn regular (takes 5 units with breakfast).   We reviewed continuous glucose monitor information together.  It varies from  55-200's.  It is lowest after lunch (his first meal of the day).  pt states he feels well in general.   Past Medical History:  Diagnosis Date  . COPD (chronic obstructive pulmonary disease) (HCC)    stopped smoking 5 years ago  . Diabetes mellitus   . GERD (gastroesophageal reflux disease)   . Hypercholesterolemia   . Hypertension   . Peripheral vascular disease [facility])     Past Surgical History:  Procedure Laterality Date  . AORTA - BILATERAL FEMORAL ARTERY BYPASS GRAFT N/A 08/11/2013   Procedure: AORTA BIFEMORAL BYPASS GRAFT;  Surgeon: Nada Libman, MD;  Location: [facility] OR;  Service: Vascular;  Laterality: N/A;  . COLONOSCOPY    . COLONOSCOPY W/ BIOPSIES AND POLYPECTOMY  2008  . HERNIA REPAIR      Social History   Social History  . Marital status: Widowed    Spouse name: N/A  . Number of children: N/A  . Years of education: N/A   Occupational History  . Not on file.   Social History Main Topics  . Smoking status: Former Smoker    Packs/day: 1.00    Years: 50.00    Types: Cigarettes    Quit date: 01/14/2009  . Smokeless tobacco: Never Used  . Alcohol use 5.4 oz/week    3 Glasses of wine, 3 Cans of beer, 3 Shots of liquor per week  . Drug use: No  . Sexual activity: Not on file   Other Topics Concern  . Not on file   Social History  Narrative  . No narrative on file    Current Outpatient Prescriptions on File Prior to Visit  Medication Sig Dispense Refill  . aspirin 81 MG tablet Take 81 mg by mouth daily. Reported on 04/22/2015    . cilostazol (PLETAL) 100 MG tablet Take 1 tablet (100 mg total) by mouth 2 (two) times daily before a meal. 180 tablet 3  . insulin NPH-regular Human (NOVOLIN 70/30) (70-30) 100 UNIT/ML injection Inject 32 Units into the skin daily with breakfast. 10 mL 11  . Insulin Syringe-Needle U-100 (INSULIN SYRINGE .5CC/30GX5/16") 30G X 5/16" 0.5 ML MISC Use to inject insulin 2 times per day. DISPENSE THE 8 MM SIZE 200 each 2  . lisinopril (PRINIVIL,ZESTRIL) 5 MG tablet Take 5 mg by mouth daily.    . pantoprazole (PROTONIX) 40 MG tablet     . rosuvastatin (CRESTOR) 40 MG tablet Take 40 mg by mouth daily.    Marland Kitchen triamcinolone (KENALOG) 0.1 % paste Use as directed 1 application in the mouth or throat 2 (two) times daily. 5 g 0   No current facility-administered medications on file prior to visit.     Allergies  Allergen Reactions  . Neomycin Other (See Comments)    unknown  . Tea Tree Oil Other (See Comments)    unknown  .  Sulfa Antibiotics Other (See Comments)    unknown  . Other Rash    Chlorhexidine gluconate    Family History  Problem Relation Age of Onset  . Hyperlipidemia Mother   . Cancer Mother   . Hyperlipidemia Father   . Cancer Father   . Diabetes Neg Hx     BP 122/84   Pulse 72   Ht  (1.676 m)   Wt 140 lb (63.5 kg)   SpO2 98%   BMI 22.60 kg/m    Review of Systems Denies LOC.      Objective:   Physical Exam VITAL SIGNS:  See vs page GENERAL: no distress Pulses: dorsalis pedis intact bilat.   MSK: no deformity of the feet CV: no leg edema Skin:  no ulcer on the feet.  normal color and temp on the feet. Neuro: sensation is intact to touch on the feet.  a1c=6.5%.     Assessment & Plan:  Type 1 DM, with PAD overcontrolled, given this regimen, which does match  insulin to his changing needs throughout the day.    Patient Instructions  check your blood sugar 4 times a day: before the 3 meals, and at bedtime.  also check if you have symptoms of your blood sugar being too high or too low.  please keep a record of the readings and bring it to your next appointment here.  You can write it on any piece of paper.  please call us sooner if your blood sugar goes below 70, or if you have a lot of readings over 200.   On this type of insulin schedule, you should eat meals on a regular schedule.  If a meal is missed or significantly delayed, your blood sugar could go low.  Please stop taking the regular insulin, and continue the same 70/30.  Please come back for a follow-up appointment in 3 months.

## 2016-06-23 NOTE — Patient Instructions (Addendum)
check your blood sugar 4 times a day: before the 3 meals, and at bedtime.  also check if you have symptoms of your blood sugar being too high or too low.  please keep a record of the readings and bring it to your next appointment here.  You can write it on any piece of paper.  please call us sooner if your blood sugar goes below 70, or if you have a lot of readings over 200.   On this type of insulin schedule, you should eat meals on a regular schedule.  If a meal is missed or significantly delayed, your blood sugar could go low.  Please stop taking the regular insulin, and continue the same 70/30.  Please come back for a follow-up appointment in 3 months.

## 2016-06-24 NOTE — Telephone Encounter (Signed)
Patient advised of message via voicemail.  

## 2016-07-06 DIAGNOSIS — E1051 Type 1 diabetes mellitus with diabetic peripheral angiopathy without gangrene: Secondary | ICD-10-CM | POA: Diagnosis not present

## 2016-08-20 DIAGNOSIS — E113293 Type 2 diabetes mellitus with mild nonproliferative diabetic retinopathy without macular edema, bilateral: Secondary | ICD-10-CM | POA: Diagnosis not present

## 2016-08-20 DIAGNOSIS — H40013 Open angle with borderline findings, low risk, bilateral: Secondary | ICD-10-CM | POA: Diagnosis not present

## 2016-08-20 DIAGNOSIS — H25013 Cortical age-related cataract, bilateral: Secondary | ICD-10-CM | POA: Diagnosis not present

## 2016-08-20 DIAGNOSIS — H2513 Age-related nuclear cataract, bilateral: Secondary | ICD-10-CM | POA: Diagnosis not present

## 2016-08-21 DIAGNOSIS — E1051 Type 1 diabetes mellitus with diabetic peripheral angiopathy without gangrene: Secondary | ICD-10-CM | POA: Diagnosis not present

## 2016-09-17 ENCOUNTER — Other Ambulatory Visit: Payer: Self-pay

## 2016-09-17 MED ORDER — ACCU-CHEK AVIVA PLUS W/DEVICE KIT: PACK | 2 refills | Status: DC

## 2016-09-20 DIAGNOSIS — E1051 Type 1 diabetes mellitus with diabetic peripheral angiopathy without gangrene: Secondary | ICD-10-CM | POA: Diagnosis not present

## 2016-09-22 ENCOUNTER — Ambulatory Visit (INDEPENDENT_AMBULATORY_CARE_PROVIDER_SITE_OTHER): Payer: Medicare HMO | Admitting: Endocrinology

## 2016-09-22 ENCOUNTER — Encounter: Payer: Self-pay | Admitting: Endocrinology

## 2016-09-22 VITALS — BP 122/84 | HR 74 | Ht 66.0 in | Wt 139.0 lb

## 2016-09-22 DIAGNOSIS — E1051 Type 1 diabetes mellitus with diabetic peripheral angiopathy without gangrene: Secondary | ICD-10-CM | POA: Diagnosis not present

## 2016-09-22 LAB — POCT GLYCOSYLATED HEMOGLOBIN (HGB A1C): HEMOGLOBIN A1C: 6.1

## 2016-09-22 NOTE — Patient Instructions (Signed)
check your blood sugar 4 times a day: before the 3 meals, and at bedtime.  also check if you have symptoms of your blood sugar being too high or too low.  please keep a record of the readings and bring it to your next appointment here.  You can write it on any piece of paper.  please call us sooner if your blood sugar goes below 70, or if you have a lot of readings over 200.   On this type of insulin schedule, you should eat meals on a regular schedule.  If a meal is missed or significantly delayed, your blood sugar could go low.  Please stop taking the regular insulin, and continue the same 70/30.  Please come back for a follow-up appointment in 3 months.

## 2016-09-22 NOTE — Progress Notes (Signed)
Subjective:    Patient ID: Steven Sanders, male    DOB: April 01, 1940, 76 y.o.   MRN: [id number]  HPI Pt returns for f/u of diabetes mellitus: DM type: 1 Dx'ed: 9233 Complications: polyneuropathy, nephropathy, retinopathy, and PAD.  Therapy: insulin since dx.   DKA: never. Severe hypoglycemia: multiple episodes, most recently in 2015.   Pancreatitis: never.  Other: he has declined multiple daily injections; he has a dexcom-5 continuous glucose monitor.   Interval history: He takes 70/30, 30 units qam  He is still taking prn reg insulin.  We reviewed continuous glucose monitor information together.  It varies from  50-200's.  It is highest mid-morning.  There is otherwise no trend throughout the day.  pt states he feels well in general.  Past Medical History:  Diagnosis Date  . COPD (chronic obstructive pulmonary disease) ([place])    stopped smoking 5 years ago  . Diabetes mellitus   . GERD (gastroesophageal reflux disease)   . Hypercholesterolemia   . Hypertension   . Peripheral vascular disease [facility])     Past Surgical History:  Procedure Laterality Date  . AORTA - BILATERAL FEMORAL ARTERY BYPASS GRAFT N/A 08/11/2013   Procedure: AORTA BIFEMORAL BYPASS GRAFT;  Surgeon: Serafina Mitchell, MD;  Location: [place];  Service: Vascular;  Laterality: N/A;  . COLONOSCOPY    . COLONOSCOPY W/ BIOPSIES AND POLYPECTOMY  2008  . HERNIA REPAIR      Social History   Social History  . Marital status: Widowed    Spouse name: N/A  . Number of children: N/A  . Years of education: N/A   Occupational History  . Not on file.   Social History Main Topics  . Smoking status: Former Smoker    Packs/day: 1.00    Years: 50.00    Types: Cigarettes    Quit date: 01/14/2009  . Smokeless tobacco: Never Used  . Alcohol use 5.4 oz/week    3 Glasses of wine, 3 Cans of beer, 3 Shots of liquor per week  . Drug use: No  . Sexual activity: Not on file   Other Topics Concern  . Not on file   Social  History Narrative  . No narrative on file    Current Outpatient Prescriptions on File Prior to Visit  Medication Sig Dispense Refill  . aspirin 81 MG tablet Take 81 mg by mouth daily. Reported on 04/22/2015    . Blood Glucose Monitoring Suppl (ACCU-CHEK AVIVA PLUS) w/Device KIT Use to check blood sugar 4 times per day. Dx code E10.65 1 kit 2  . cilostazol (PLETAL) 100 MG tablet Take 1 tablet (100 mg total) by mouth 2 (two) times daily before a meal. 180 tablet 3  . insulin NPH-regular Human (NOVOLIN 70/30) (70-30) 100 UNIT/ML injection Inject 32 Units into the skin daily with breakfast. 10 mL 11  . Insulin Syringe-Needle U-100 (INSULIN SYRINGE .5CC/30GX5/16") 30G X 5/16" 0.5 ML MISC Use to inject insulin 2 times per day. DISPENSE THE 8 MM SIZE 200 each 2  . lisinopril (PRINIVIL,ZESTRIL) 5 MG tablet Take 5 mg by mouth daily.    . pantoprazole (PROTONIX) 40 MG tablet     . rosuvastatin (CRESTOR) 40 MG tablet Take 40 mg by mouth daily.     No current facility-administered medications on file prior to visit.     Allergies  Allergen Reactions  . Neomycin Other (See Comments)    unknown  . Tea Tree Oil Other (See Comments)  unknown  . Sulfa Antibiotics Other (See Comments)    unknown  . Other Rash    Chlorhexidine gluconate    Family History  Problem Relation Age of Onset  . Hyperlipidemia Mother   . Cancer Mother   . Hyperlipidemia Father   . Cancer Father   . Diabetes Neg Hx     BP 122/84   Pulse 74   Ht _0  (1.676 m)   Wt 139 lb (63 kg)   SpO2 97%   BMI 22.44 kg/m    Review of Systems Denies LOC    Objective:   Physical Exam VITAL SIGNS:  See vs page GENERAL: no distress Pulses: dorsalis pedis intact bilat.   MSK: no deformity of the feet CV: no leg edema Skin:  no ulcer on the feet.  normal color and temp on the feet. Neuro: sensation is intact to touch on the feet Ext: ecchymosis under the right 2nd toenail  Lab Results  Component Value Date   HGBA1C  6.1 09/22/2016      Assessment & Plan:  Type 1 DM, with DR: overcontrolled.  Patient Instructions  check your blood sugar 4 times a day: before the 3 meals, and at bedtime.  also check if you have symptoms of your blood sugar being too high or too low.  please keep a record of the readings and bring it to your next appointment here.  You can write it on any piece of paper.  please call us sooner if your blood sugar goes below 70, or if you have a lot of readings over 200.   On this type of insulin schedule, you should eat meals on a regular schedule.  If a meal is missed or significantly delayed, your blood sugar could go low.  Please stop taking the regular insulin, and continue the same 70/30.  Please come back for a follow-up appointment in 3 months.

## 2016-09-24 ENCOUNTER — Telehealth: Payer: Self-pay | Admitting: Endocrinology

## 2016-09-24 MED ORDER — GLUCOSE BLOOD VI STRP: ORAL_STRIP | 2 refills | Status: DC

## 2016-09-24 NOTE — Telephone Encounter (Signed)
**  Remind patient they can make refill requests via MyChart**  Medication refill request (Name & Dosage):  ACCU-CHECK; 8 BOXES OF 50 TEST STRIPS  Preferred pharmacy (Name & Address):  [facility]   Other comments (if applicable):

## 2016-09-24 NOTE — Telephone Encounter (Signed)
Refill submitted. 

## 2016-10-01 ENCOUNTER — Other Ambulatory Visit: Payer: Self-pay

## 2016-10-01 MED ORDER — ACCU-CHEK AVIVA PLUS W/DEVICE KIT: PACK | 2 refills | Status: DC

## 2016-10-13 ENCOUNTER — Ambulatory Visit (HOSPITAL_COMMUNITY)
Admission: RE | Admit: 2016-10-13 | Discharge: 2016-10-13 | Disposition: A | Discharge status: Home / Self Care | Payer: Medicare HMO | Source: Ambulatory Visit | Attending: Vascular Surgery | Admitting: Vascular Surgery

## 2016-10-13 ENCOUNTER — Encounter: Payer: Self-pay | Admitting: Family

## 2016-10-13 ENCOUNTER — Ambulatory Visit (INDEPENDENT_AMBULATORY_CARE_PROVIDER_SITE_OTHER): Payer: Medicare HMO | Admitting: Family

## 2016-10-13 ENCOUNTER — Ambulatory Visit (INDEPENDENT_AMBULATORY_CARE_PROVIDER_SITE_OTHER)
Admission: RE | Admit: 2016-10-13 | Discharge: 2016-10-13 | Disposition: A | Discharge status: Home / Self Care | Payer: Medicare HMO | Source: Ambulatory Visit | Attending: Vascular Surgery | Admitting: Vascular Surgery

## 2016-10-13 VITALS — BP 140/62 | HR 46 | Temp 97.9°F | Resp 20 | Ht 66.0 in | Wt 133.0 lb

## 2016-10-13 DIAGNOSIS — Z95828 Presence of other vascular implants and grafts: Secondary | ICD-10-CM | POA: Insufficient documentation

## 2016-10-13 DIAGNOSIS — E785 Hyperlipidemia, unspecified: Secondary | ICD-10-CM | POA: Insufficient documentation

## 2016-10-13 DIAGNOSIS — R938 Abnormal findings on diagnostic imaging of other specified body structures: Secondary | ICD-10-CM | POA: Diagnosis not present

## 2016-10-13 DIAGNOSIS — Z8679 Personal history of other diseases of the circulatory system: Secondary | ICD-10-CM | POA: Insufficient documentation

## 2016-10-13 DIAGNOSIS — Z87891 Personal history of nicotine dependence: Secondary | ICD-10-CM | POA: Insufficient documentation

## 2016-10-13 DIAGNOSIS — I771 Stricture of artery: Secondary | ICD-10-CM | POA: Diagnosis not present

## 2016-10-13 DIAGNOSIS — E119 Type 2 diabetes mellitus without complications: Secondary | ICD-10-CM | POA: Diagnosis not present

## 2016-10-13 DIAGNOSIS — R0989 Other specified symptoms and signs involving the circulatory and respiratory systems: Secondary | ICD-10-CM

## 2016-10-13 LAB — VAS US LOWER EXTREMITY ARTERIAL DUPLEX
LATIBDISTSYS: 27 cm/s
LSFDPSV: -94 cm/s
LSFMPSV: -146 cm/s
Left super femoral prox sys PSV: 119 cm/s
RIGHT ANT DIST TIBAL SYS PSV: 25 cm/s
RIGHT POST TIB DIST SYS: 40 cm/s
RSFDPSV: -70 cm/s
RSFMPSV: 0 cm/s
RSFPPSV: -99 cm/s
left post tibial dist sys: -42 cm/s

## 2016-10-13 NOTE — Progress Notes (Signed)
[facility] [place]   CC: Follow up peripheral artery occlusive disease  History of Present Illness Steven Sanders is a 76 y.o. male patient of Dr. Trula Slade who is s/paortobifemoral bypass graft using a 14 x 8 dacron graft on 08/11/2013. This was done for lifestyle limiting claudication. He has a history of lower extremity ulceration which ultimately healed. He has a known right SFA occlusion.   He walks 5 miles/day, 6-7 days/week. He is riding a stationary bike for a mile and does resistance exercises 2-3 days/week. He has a treadmill and stationary bike at home.  He denies claudication sx's with walking, denies non healing wounds in his lower extremities. He resumed cilostizol use about February 2018.     The patient denies any history of TIA or stroke symptoms, specifically the patient denies a history of amaurosis fugax or monocular blindness, unilateral facial drooping, hemiplegia, and denies a history or expressive aphasia.   The patient has nothad back or abdominal pain.  Pt Diabetic: Yes, diagnosed at age 58, 6.1 A1C on 09-22-16 (review of records). He wears a continuous glucose monitor. He denies any peripheral neuropathy sx's.  Pt smoker: former smoker, quit in 2010  Pt meds include: Statin :Yes Betablocker: No ASA: Yes Other anticoagulants/antiplatelets: no   Past Medical History:  Diagnosis Date  . COPD (chronic obstructive pulmonary disease) ([place])    stopped smoking 5 years ago  . Diabetes mellitus   . GERD (gastroesophageal reflux disease)   . Hypercholesterolemia   . Hypertension   . Peripheral vascular disease [facility])     Social History Social History  Substance Use Topics  . Smoking status: Former Smoker    Packs/day: 1.00    Years: 50.00    Types: Cigarettes    Quit date: 01/14/2009  . Smokeless tobacco: Never Used  . Alcohol use 5.4 oz/week    3 Glasses of wine, 3 Cans of beer, 3 Shots of liquor per week    Family  History Family History  Problem Relation Age of Onset  . Hyperlipidemia Mother   . Cancer Mother   . Hyperlipidemia Father   . Cancer Father   . Diabetes Neg Hx     Past Surgical History:  Procedure Laterality Date  . AORTA - BILATERAL FEMORAL ARTERY BYPASS GRAFT N/A 08/11/2013   Procedure: AORTA BIFEMORAL BYPASS GRAFT;  Surgeon: Serafina Mitchell, MD;  Location: [place];  Service: Vascular;  Laterality: N/A;  . COLONOSCOPY    . COLONOSCOPY W/ BIOPSIES AND POLYPECTOMY  2008  . HERNIA REPAIR      Allergies  Allergen Reactions  . Neomycin Other (See Comments)    unknown  . Tea Tree Oil Other (See Comments)    unknown  . Simvastatin   . Sulfa Antibiotics Other (See Comments)    unknown  . Other Rash    Chlorhexidine gluconate    Current Outpatient Prescriptions  Medication Sig Dispense Refill  . aspirin 81 MG tablet Take 81 mg by mouth daily. Reported on 04/22/2015    . Blood Glucose Monitoring Suppl (ACCU-CHEK AVIVA PLUS) w/Device KIT Use to check blood sugar 4 times per day. Dx code E10.65 1 kit 2  . cilostazol (PLETAL) 100 MG tablet Take 1 tablet (100 mg total) by mouth 2 (two) times daily before a meal. 180 tablet 3  . glucose blood (ACCU-CHEK AVIVA PLUS) test strip Use to check blood sugar 4 times per day. 400 each 2  . insulin NPH-regular Human (  NOVOLIN 70/30) (70-30) 100 UNIT/ML injection Inject 32 Units into the skin daily with breakfast. 10 mL 11  . Insulin Syringe-Needle U-100 (INSULIN SYRINGE .5CC/30GX5/16") 30G X 5/16" 0.5 ML MISC Use to inject insulin 2 times per day. DISPENSE THE 8 MM SIZE 200 each 2  . lisinopril (PRINIVIL,ZESTRIL) 5 MG tablet Take 5 mg by mouth daily.    . pantoprazole (PROTONIX) 40 MG tablet     . rosuvastatin (CRESTOR) 40 MG tablet Take 40 mg by mouth daily.     No current facility-administered medications for this visit.     ROS: See HPI for pertinent positives and negatives.   Physical Examination  Vitals:   10/13/16 1013  BP: 140/62   Pulse: (!) 46  Resp: 20  Temp: 97.9 F (36.6 C)  TempSrc: Oral  SpO2: 99%  Weight: 133 lb (60.3 kg)  Height: '5\' 6"'$  (1.676 m)   Body mass index is 21.47 kg/m.  General: WDWN male in NAD Gait: Normal HENT: WNL Eyes: Pupils equal Pulmonary: normal non-labored breathing, no rales, rhonchi, or wheezing Cardiac: RRR, no murmur detected  Abdomen: soft, NT, no masses palpated. CGM device in place [place]. Skin: no rashes, no ulcers, no cellulitis.   VASCULAR EXAM  Carotid Bruits Right Left   Negative negative   Aorta is not palpable Radial pulses are 2+ palpable and =   VASCULAR EXAM: Extremitieswithoutischemic changes, withoutGangrene; no open wounds. Hand are cool and ruddy with some blanching, with sluggish capillary refills at all fingertips.   LE Pulses Right Left  FEMORAL 3+ palpable 3+ palpable   POPLITEAL not palpable  not palpable  POSTERIOR TIBIAL not palpable  not palpable   DORSALIS PEDIS ANTERIOR TIBIAL not palpable  1+ palpable     Musculoskeletal: no muscle wasting or atrophy. Neurologic:A&O X 3; Appropriate Affect ;  SENSATION: normal; MOTOR FUNCTION: 5/5 Symmetric, CN 2-12 intact Speech is fluent/normal     ASSESSMENT: Steven Sanders is a 76 y.o. male who is s/paortobifemoral bypass graft using a 14 x 8 dacryon graft on 08/11/2013. This was done for lifestyle limiting claudication. He has a history of lower extremity ulceration which ultimately healed. He has a known right SFA occlusion.   He walks a mile in 20 minutes, 2-3 days/week. He rides a bike for a mile and does resistance exercises 2-3 days/week. He has no claudication sx's with walking, no signs of ischemia in his LE's.  ABI's and bilateral LE arterial duplex today indicate that pt has developed extensive collateral revascularization in both legs by his intensive walking program.   He has no  hx of stroke or TIA.    His atherosclerotic risk factors include controlled Type 1 DM and former smoker.     DATA  Bilateral LE Arterial Duplex (10/13/16): Bilateral outflow velocities and waveforms suggest patent ABFBG. Right mid SFA occlusion with distal vessel filled with collaterals. Left distal SFA/popliteal artery occlusion with distal vessel filled via collaterals. No previous scan of the lower extremity arteries.   ABI (Date: 10/13/2016)  R:   ABI: 0.76 (was 0.73 on 03-24-15),   PT: bi  DP: mono  TBI:  0.41 (was 0.44)  L:   ABI: 0.72 (was 0.80),   PT: tri  DP: tri  TBI: 0.51 (was 0.33)  Stable bilateral ABI and TBI: moderate arterial occlusive disease bilaterally with tri, bi, an monophasic waveforms.    Carotid duplex 04-13-16: <40% bilateral ICA stenosis. Bilateral vertebral artery flow is antegrade.  Bilateral subclavian artery  waveforms are normal.  No significant change compared to the last exam on 04-03-15.  Aortoiliac duplex 04-13-16): Decreased visualization of the abdominal vasculature due to overlying bowel gas. Patent aorto-bifemoral bypass graft without evidence of restenosis; however, the left distal anastomosis velocity (180 cm/s) is somewhat elevated without disease observed.  PLAN:   Continue fairly intensive exercise regimen.   Based on today's exam and non-invasive vascular lab results, and after discussing with Dr. Trula Slade at pt's visit on 04-13-16 , the patient will follow up in 6 months with the following tests:  ABI's,  bilateral LE arterial duplex, and bilateral aortoiliac duplex; carotid duplex in 2 years.   I advised the pt to notify our office if his claudication worsens or he develops non healing wounds .    I discussed in depth with the patient the nature of atherosclerosis, and emphasized the importance of maximal medical management including strict control of blood pressure, blood glucose, and lipid levels, obtaining regular  exercise, and continued cessation of smoking.  The patient is aware that without maximal medical management the underlying atherosclerotic disease process will progress, limiting the benefit of any interventions.  The patient was given information about PAD including signs, symptoms, treatment, what symptoms should prompt the patient to seek immediate medical care, and risk reduction measures to take.  Clemon Chambers, RN, MSN, FNP-C [facility] [employer] [facility] Phone: [phone]  Clinic MD: Early  10/13/16 10:28 AM

## 2016-10-13 NOTE — Patient Instructions (Addendum)
Peripheral Vascular Disease Peripheral vascular disease (PVD) is a disease of the blood vessels that are not part of your heart and brain. A simple term for PVD is poor circulation. In most cases, PVD narrows the blood vessels that carry blood from your heart to the rest of your body. This can result in a decreased supply of blood to your arms, legs, and internal organs, like your stomach or kidneys. However, it most often affects a person's lower legs and feet. There are two types of PVD.  Organic PVD. This is the more common type. It is caused by damage to the structure of blood vessels.  Functional PVD. This is caused by conditions that make blood vessels contract and tighten (spasm).  Without treatment, PVD tends to get worse over time. PVD can also lead to acute ischemic limb. This is when an arm or limb suddenly has trouble getting enough blood. This is a medical emergency. Follow these instructions at home:  Take medicines only as told by your doctor.  Do not use any tobacco products, including cigarettes, chewing tobacco, or electronic cigarettes. If you need help quitting, ask your doctor.  Lose weight if you are overweight, and maintain a healthy weight as told by your doctor.  Eat a diet that is low in fat and cholesterol. If you need help, ask your doctor.  Exercise regularly. Ask your doctor for some good activities for you.  Take good care of your feet. ? Wear comfortable shoes that fit well. ? Check your feet often for any cuts or sores. Contact a doctor if:  You have cramps in your legs while walking.  You have leg pain when you are at rest.  You have coldness in a leg or foot.  Your skin changes.  You are unable to get or have an erection (erectile dysfunction).  You have cuts or sores on your feet that are not healing. Get help right away if:  Your arm or leg turns cold and blue.  Your arms or legs become red, warm, swollen, painful, or numb.  You have  chest pain or trouble breathing.  You suddenly have weakness in your face, arm, or leg.  You become very confused or you cannot speak.  You suddenly have a very bad headache.  You suddenly cannot see. This information is not intended to replace advice given to you by your health care provider. Make sure you discuss any questions you have with your health care provider. Document Released: 05/27/2009 Document Revised: 08/08/2015 Document Reviewed: 08/10/2013 Elsevier Interactive Patient Education  2017 [employer]   Before your next abdominal ultrasound:  Take two Extra-Strength Gas-X capsules at bedtime the night before the test. Take another two Extra-Strength Gas-X capsules 3 hours before the test.  Avoid gas forming foods the day before the test.     

## 2016-10-20 NOTE — Addendum Note (Signed)
Addended by: Burton ApleyPETTY, Venetta Knee A on: 10/20/2016 04:38 PM   Modules accepted: Orders

## 2016-11-10 DIAGNOSIS — E1149 Type 2 diabetes mellitus with other diabetic neurological complication: Secondary | ICD-10-CM | POA: Diagnosis not present

## 2016-11-10 DIAGNOSIS — E7800 Pure hypercholesterolemia, unspecified: Secondary | ICD-10-CM | POA: Diagnosis not present

## 2016-11-10 DIAGNOSIS — E13319 Other specified diabetes mellitus with unspecified diabetic retinopathy without macular edema: Secondary | ICD-10-CM | POA: Diagnosis not present

## 2016-11-10 DIAGNOSIS — R809 Proteinuria, unspecified: Secondary | ICD-10-CM | POA: Diagnosis not present

## 2016-11-10 DIAGNOSIS — E1139 Type 2 diabetes mellitus with other diabetic ophthalmic complication: Secondary | ICD-10-CM | POA: Diagnosis not present

## 2016-11-10 DIAGNOSIS — G629 Polyneuropathy, unspecified: Secondary | ICD-10-CM | POA: Diagnosis not present

## 2016-11-10 DIAGNOSIS — K219 Gastro-esophageal reflux disease without esophagitis: Secondary | ICD-10-CM | POA: Diagnosis not present

## 2016-11-10 DIAGNOSIS — E1129 Type 2 diabetes mellitus with other diabetic kidney complication: Secondary | ICD-10-CM | POA: Diagnosis not present

## 2016-11-10 DIAGNOSIS — Z794 Long term (current) use of insulin: Secondary | ICD-10-CM | POA: Diagnosis not present

## 2016-11-18 DIAGNOSIS — E1051 Type 1 diabetes mellitus with diabetic peripheral angiopathy without gangrene: Secondary | ICD-10-CM | POA: Diagnosis not present

## 2016-12-02 ENCOUNTER — Emergency Department (HOSPITAL_COMMUNITY): Admission: EM | Admit: 2016-12-02 | Payer: Medicare HMO | Source: Home / Self Care

## 2016-12-02 ENCOUNTER — Ambulatory Visit (INDEPENDENT_AMBULATORY_CARE_PROVIDER_SITE_OTHER): Payer: Medicare HMO | Admitting: Endocrinology

## 2016-12-02 ENCOUNTER — Encounter: Payer: Self-pay | Admitting: Endocrinology

## 2016-12-02 VITALS — BP 112/66 | HR 59 | Wt 133.6 lb

## 2016-12-02 DIAGNOSIS — E1051 Type 1 diabetes mellitus with diabetic peripheral angiopathy without gangrene: Secondary | ICD-10-CM

## 2016-12-02 LAB — POCT GLYCOSYLATED HEMOGLOBIN (HGB A1C): Hemoglobin A1C: 6.3

## 2016-12-02 MED ORDER — CEPHALEXIN 250 MG PO CAPS: 250.0000 mg | ORAL_CAPSULE | Freq: Three times a day (TID) | ORAL | 0 refills | Status: DC

## 2016-12-02 MED ORDER — INSULIN NPH ISOPHANE & REGULAR (70-30) 100 UNIT/ML ~~LOC~~ SUSP: 25.0000 [IU] | Freq: Every day | SUBCUTANEOUS | 11 refills | Status: DC

## 2016-12-02 NOTE — Patient Instructions (Addendum)
check your blood sugar 4 times a day: before the 3 meals, and at bedtime.  also check if you have symptoms of your blood sugar being too high or too low.  please keep a record of the readings and bring it to your next appointment here.  You can write it on any piece of paper.  please call us sooner if your blood sugar goes below 70, or if you have a lot of readings over 200.   On this type of insulin schedule, you should eat meals on a regular schedule.  If a meal is missed or significantly delayed, your blood sugar could go low.    I have sent a prescription to your pharmacy, for an antibiotic pill.   Keep the ulcer covered with antibiotic ointment and a bandaid.   Please reduce the 70/30 insulin to 25 units with breakfast.  Please come back for a follow-up appointment in 4 months.

## 2016-12-02 NOTE — Progress Notes (Signed)
Subjective:    Patient ID: Steven Sanders, male    DOB: December 27, 1940, 76 y.o.   MRN: [id number]  HPI Pt returns for f/u of diabetes mellitus: DM type: 1 Dx'ed: 3664 Complications: polyneuropathy, nephropathy, retinopathy, and PAD.  Therapy: insulin since dx.   DKA: never. Severe hypoglycemia: multiple episodes, most recently in 2015.   Pancreatitis: never.  Other: he has declined multiple daily injections; he has a dexcom-5 continuous glucose monitor.   Interval history: He takes 70/30, 30 units qam  He is still taking prn reg insulin.  We reviewed continuous glucose monitor information together.  It varies from  50-200's.  It is highest mid-morning.  There is otherwise no trend throughout the day.  pt states he feels well in general.   Past Medical History:  Diagnosis Date  . COPD (chronic obstructive pulmonary disease) ([place])    stopped smoking 5 years ago  . Diabetes mellitus   . GERD (gastroesophageal reflux disease)   . Hypercholesterolemia   . Hypertension   . Peripheral vascular disease [facility])     Past Surgical History:  Procedure Laterality Date  . AORTA - BILATERAL FEMORAL ARTERY BYPASS GRAFT N/A 08/11/2013   Procedure: AORTA BIFEMORAL BYPASS GRAFT;  Surgeon: Serafina Mitchell, MD;  Location: [facility];  Service: Vascular;  Laterality: N/A;  . COLONOSCOPY    . COLONOSCOPY W/ BIOPSIES AND POLYPECTOMY  2008  . HERNIA REPAIR      Social History   Social History  . Marital status: Widowed    Spouse name: N/A  . Number of children: N/A  . Years of education: N/A   Occupational History  . Not on file.   Social History Main Topics  . Smoking status: Former Smoker    Packs/day: 1.00    Years: 50.00    Types: Cigarettes    Quit date: 01/14/2009  . Smokeless tobacco: Never Used  . Alcohol use 5.4 oz/week    3 Glasses of wine, 3 Cans of beer, 3 Shots of liquor per week  . Drug use: No  . Sexual activity: Not on file   Other Topics Concern  . Not on file   Social  History Narrative  . No narrative on file    Current Outpatient Prescriptions on File Prior to Visit  Medication Sig Dispense Refill  . aspirin 81 MG tablet Take 81 mg by mouth daily. Reported on 04/22/2015    . Blood Glucose Monitoring Suppl (ACCU-CHEK AVIVA PLUS) w/Device KIT Use to check blood sugar 4 times per day. Dx code E10.65 1 kit 2  . cilostazol (PLETAL) 100 MG tablet Take 1 tablet (100 mg total) by mouth 2 (two) times daily before a meal. 180 tablet 3  . glucose blood (ACCU-CHEK AVIVA PLUS) test strip Use to check blood sugar 4 times per day. 400 each 2  . Insulin Syringe-Needle U-100 (INSULIN SYRINGE .5CC/30GX5/16") 30G X 5/16" 0.5 ML MISC Use to inject insulin 2 times per day. DISPENSE THE 8 MM SIZE 200 each 2  . lisinopril (PRINIVIL,ZESTRIL) 5 MG tablet Take 5 mg by mouth daily.    . pantoprazole (PROTONIX) 40 MG tablet     . rosuvastatin (CRESTOR) 40 MG tablet Take 40 mg by mouth daily.     No current facility-administered medications on file prior to visit.     Allergies  Allergen Reactions  . Neomycin Other (See Comments)    unknown  . Tea Tree Oil Other (See Comments)    unknown  .  Simvastatin   . Sulfa Antibiotics Other (See Comments)    unknown  . Other Rash    Chlorhexidine gluconate    Family History  Problem Relation Age of Onset  . Hyperlipidemia Mother   . Cancer Mother   . Hyperlipidemia Father   . Cancer Father   . Diabetes Neg Hx     BP 112/66   Pulse (!) 59   Wt 133 lb 9.6 oz (60.6 kg)   SpO2 95%   BMI 21.56 kg/m    Review of Systems He denies LOC    Objective:   Physical Exam VITAL SIGNS:  See vs page GENERAL: no distress Pulses: foot pulses are intact bilaterally.   MSK: no deformity of the feet or ankles.  CV: no edema of the legs or ankles.  Skin: there is a few mm shallow ulcer at the dorsal aspect of the right 3rd toe, with few mm rim of erythema.  normal color and temp on the feet and ankles.   Neuro: sensation is intact to  touch on the feet and ankles.   Ext: There is bilateral onychomycosis of the toenails    Lab Results  Component Value Date   HGBA1C 6.3 12/02/2016      Assessment & Plan:  Type 1 DM, with PAD: overcontrolled.  Foot ulcer, new.   Patient Instructions  check your blood sugar 4 times a day: before the 3 meals, and at bedtime.  also check if you have symptoms of your blood sugar being too high or too low.  please keep a record of the readings and bring it to your next appointment here.  You can write it on any piece of paper.  please call us sooner if your blood sugar goes below 70, or if you have a lot of readings over 200.   On this type of insulin schedule, you should eat meals on a regular schedule.  If a meal is missed or significantly delayed, your blood sugar could go low.    I have sent a prescription to your pharmacy, for an antibiotic pill.   Keep the ulcer covered with antibiotic ointment and a bandaid.   Please reduce the 70/30 insulin to 25 units with breakfast.  Please come back for a follow-up appointment in 4 months.

## 2016-12-18 DIAGNOSIS — E1051 Type 1 diabetes mellitus with diabetic peripheral angiopathy without gangrene: Secondary | ICD-10-CM | POA: Diagnosis not present

## 2016-12-23 ENCOUNTER — Ambulatory Visit (INDEPENDENT_AMBULATORY_CARE_PROVIDER_SITE_OTHER): Payer: Medicare HMO | Admitting: Endocrinology

## 2016-12-23 ENCOUNTER — Encounter: Payer: Self-pay | Admitting: Endocrinology

## 2016-12-23 VITALS — BP 128/58 | HR 60 | Wt 134.0 lb

## 2016-12-23 DIAGNOSIS — E1051 Type 1 diabetes mellitus with diabetic peripheral angiopathy without gangrene: Secondary | ICD-10-CM

## 2016-12-23 MED ORDER — INSULIN NPH ISOPHANE & REGULAR (70-30) 100 UNIT/ML ~~LOC~~ SUSP: 30.0000 [IU] | Freq: Every day | SUBCUTANEOUS | 11 refills | Status: DC

## 2016-12-23 NOTE — Patient Instructions (Addendum)
check your blood sugar 4 times a day: before the 3 meals, and at bedtime.  also check if you have symptoms of your blood sugar being too high or too low.  please keep a record of the readings and bring it to your next appointment here.  You can write it on any piece of paper.  please call us sooner if your blood sugar goes below 70, or if you have a lot of readings over 200.   On this type of insulin schedule, you should eat meals on a regular schedule.  If a meal is missed or significantly delayed, your blood sugar could go low.     Please continue the 70/30 insulin, 30 units with breakfast, but avoid the reg insulin.  Carefully check your feet, and call if the an ulcer comes back.   Please come back for a follow-up appointment in 4 months.

## 2016-12-23 NOTE — Progress Notes (Signed)
Subjective:    Patient ID: Steven Sanders, male    DOB: [birth year], 76 y.o.   MRN: [id number]  HPI Pt returns for f/u of diabetes mellitus: DM type: 1 Dx'ed: 9476 Complications: polyneuropathy, nephropathy, retinopathy, and PAD.  Therapy: insulin since dx.   DKA: never. Severe hypoglycemia: multiple episodes, most recently in 2015.   Pancreatitis: never.  Other: he has declined multiple daily injections; he has a dexcom-5 continuous glucose monitor.   Interval history: He still takes 70/30, 30 units qam  He is still taking prn reg insulin (averages approx 3 total units/day).  We reviewed continuous glucose monitor information together.  It varies from  35-200's.  It is in general higher as the day goes on.  There is otherwise no trend throughout the day.  pt states he feels well in general.   Past Medical History:  Diagnosis Date  . COPD (chronic obstructive pulmonary disease) ([place])    stopped smoking 5 years ago  . Diabetes mellitus   . GERD (gastroesophageal reflux disease)   . Hypercholesterolemia   . Hypertension   . Peripheral vascular disease [facility])     Past Surgical History:  Procedure Laterality Date  . AORTA - BILATERAL FEMORAL ARTERY BYPASS GRAFT N/A 08/11/2013   Procedure: AORTA BIFEMORAL BYPASS GRAFT;  Surgeon: Serafina Mitchell, MD;  Location: [facility];  Service: Vascular;  Laterality: N/A;  . COLONOSCOPY    . COLONOSCOPY W/ BIOPSIES AND POLYPECTOMY  2008  . HERNIA REPAIR      Social History   Social History  . Marital status: Widowed    Spouse name: N/A  . Number of children: N/A  . Years of education: N/A   Occupational History  . Not on file.   Social History Main Topics  . Smoking status: Former Smoker    Packs/day: 1.00    Years: 50.00    Types: Cigarettes    Quit date: 01/14/2009  . Smokeless tobacco: Never Used  . Alcohol use 5.4 oz/week    3 Glasses of wine, 3 Cans of beer, 3 Shots of liquor per week  . Drug use: No  . Sexual activity: Not on  file   Other Topics Concern  . Not on file   Social History Narrative  . No narrative on file    Current Outpatient Prescriptions on File Prior to Visit  Medication Sig Dispense Refill  . aspirin 81 MG tablet Take 81 mg by mouth daily. Reported on 04/22/2015    . Blood Glucose Monitoring Suppl (ACCU-CHEK AVIVA PLUS) w/Device KIT Use to check blood sugar 4 times per day. Dx code E10.65 1 kit 2  . cilostazol (PLETAL) 100 MG tablet Take 1 tablet (100 mg total) by mouth 2 (two) times daily before a meal. 180 tablet 3  . glucose blood (ACCU-CHEK AVIVA PLUS) test strip Use to check blood sugar 4 times per day. 400 each 2  . Insulin Syringe-Needle U-100 (INSULIN SYRINGE .5CC/30GX5/16") 30G X 5/16" 0.5 ML MISC Use to inject insulin 2 times per day. DISPENSE THE 8 MM SIZE 200 each 2  . lisinopril (PRINIVIL,ZESTRIL) 5 MG tablet Take 5 mg by mouth daily.    . pantoprazole (PROTONIX) 40 MG tablet     . rosuvastatin (CRESTOR) 40 MG tablet Take 40 mg by mouth daily.    . cephALEXin (KEFLEX) 250 MG capsule Take 1 capsule (250 mg total) by mouth 3 (three) times daily. (Patient not taking: Reported on 12/23/2016) 21 capsule 0  No current facility-administered medications on file prior to visit.     Allergies  Allergen Reactions  . Neomycin Other (See Comments)    unknown  . Tea Tree Oil Other (See Comments)    unknown  . Simvastatin   . Sulfa Antibiotics Other (See Comments)    unknown  . Other Rash    Chlorhexidine gluconate    Family History  Problem Relation Age of Onset  . Hyperlipidemia Mother   . Cancer Mother   . Hyperlipidemia Father   . Cancer Father   . Diabetes Neg Hx     BP (!) 128/58   Pulse 60   Wt 134 lb (60.8 kg)   SpO2 94%   BMI 21.63 kg/m    Review of Systems He denies LOC    Objective:   Physical Exam VITAL SIGNS:  See vs page.  GENERAL: no distress.  Pulses: foot pulses are intact bilaterally.   MSK: no deformity of the feet or ankles.  CV: no edema of  the legs or ankles.  Skin: right 3rd toe ulcer is healed.  normal color and temp on the feet and ankles.   Neuro: sensation is intact to touch on the feet and ankles.   Ext: There is bilateral onychomycosis of the toenails.       Assessment & Plan:  Type 1 DM, with PAD: overcontrolled Foot ulcer: better Noncompliance with insulin dosing: we discussed risks.   Patient Instructions  check your blood sugar 4 times a day: before the 3 meals, and at bedtime.  also check if you have symptoms of your blood sugar being too high or too low.  please keep a record of the readings and bring it to your next appointment here.  You can write it on any piece of paper.  please call us sooner if your blood sugar goes below 70, or if you have a lot of readings over 200.   On this type of insulin schedule, you should eat meals on a regular schedule.  If a meal is missed or significantly delayed, your blood sugar could go low.     Please continue the 70/30 insulin, 30 units with breakfast, but avoid the reg insulin.  Carefully check your feet, and call if the an ulcer comes back.   Please come back for a follow-up appointment in 4 months.

## 2017-01-19 DIAGNOSIS — E1051 Type 1 diabetes mellitus with diabetic peripheral angiopathy without gangrene: Secondary | ICD-10-CM | POA: Diagnosis not present

## 2017-02-26 DIAGNOSIS — E1051 Type 1 diabetes mellitus with diabetic peripheral angiopathy without gangrene: Secondary | ICD-10-CM | POA: Diagnosis not present

## 2017-03-03 DIAGNOSIS — E1129 Type 2 diabetes mellitus with other diabetic kidney complication: Secondary | ICD-10-CM | POA: Diagnosis not present

## 2017-03-03 DIAGNOSIS — E1149 Type 2 diabetes mellitus with other diabetic neurological complication: Secondary | ICD-10-CM | POA: Diagnosis not present

## 2017-03-03 DIAGNOSIS — E13319 Other specified diabetes mellitus with unspecified diabetic retinopathy without macular edema: Secondary | ICD-10-CM | POA: Diagnosis not present

## 2017-03-03 DIAGNOSIS — E1139 Type 2 diabetes mellitus with other diabetic ophthalmic complication: Secondary | ICD-10-CM | POA: Diagnosis not present

## 2017-03-03 DIAGNOSIS — Z794 Long term (current) use of insulin: Secondary | ICD-10-CM | POA: Diagnosis not present

## 2017-04-26 ENCOUNTER — Ambulatory Visit: Payer: Medicare HMO | Admitting: Endocrinology

## 2017-04-26 ENCOUNTER — Encounter: Payer: Self-pay | Admitting: Endocrinology

## 2017-04-26 VITALS — BP 142/60 | HR 66 | Wt 131.2 lb

## 2017-04-26 DIAGNOSIS — E1051 Type 1 diabetes mellitus with diabetic peripheral angiopathy without gangrene: Secondary | ICD-10-CM

## 2017-04-26 LAB — POCT GLYCOSYLATED HEMOGLOBIN (HGB A1C): HEMOGLOBIN A1C: 6.6

## 2017-04-26 NOTE — Patient Instructions (Signed)
check your blood sugar 4 times a day: before the 3 meals, and at bedtime.  also check if you have symptoms of your blood sugar being too high or too low.  please keep a record of the readings and bring it to your next appointment here.  You can write it on any piece of paper.  please call us sooner if your blood sugar goes below 70, or if you have a lot of readings over 200.   On this type of insulin schedule, you should eat meals on a regular schedule.  If a meal is missed or significantly delayed, your blood sugar could go low.     Please continue the 70/30 insulin, 30 units with breakfast, but avoid the reg insulin.  Please come back for a follow-up appointment in 4 months.

## 2017-04-26 NOTE — Progress Notes (Signed)
Subjective:    Patient ID: Steven Sanders, male    DOB: 1940/10/01, 77 y.o.   MRN: [id number]  HPI Pt returns for f/u of diabetes mellitus: DM type: 1 Dx'ed: 1749 Complications: polyneuropathy, nephropathy, retinopathy, and PAD.  Therapy: insulin since dx.   DKA: never. Severe hypoglycemia: multiple episodes, most recently in 2015.   Pancreatitis: never.  Other: he has declined multiple daily injections; he has a dexcom-5 continuous glucose monitor.   Interval history: He still takes 70/30, 30 units qam  He continues to take prn reg insulin (averages approx 3 total units/day).  We reviewed continuous glucose monitor information together.  It varies from  35-200's.  It is in general higher as the day goes on.  There is otherwise no trend throughout the day.  pt states he feels well in general.   Past Medical History:  Diagnosis Date  . COPD (chronic obstructive pulmonary disease) ([place])    stopped smoking 5 years ago  . Diabetes mellitus   . GERD (gastroesophageal reflux disease)   . Hypercholesterolemia   . Hypertension   . Peripheral vascular disease [facility])     Past Surgical History:  Procedure Laterality Date  . AORTA - BILATERAL FEMORAL ARTERY BYPASS GRAFT N/A 08/11/2013   Procedure: AORTA BIFEMORAL BYPASS GRAFT;  Surgeon: Serafina Mitchell, MD;  Location: [facility];  Service: Vascular;  Laterality: N/A;  . COLONOSCOPY    . COLONOSCOPY W/ BIOPSIES AND POLYPECTOMY  2008  . HERNIA REPAIR      Social History   Socioeconomic History  . Marital status: Widowed    Spouse name: Not on file  . Number of children: Not on file  . Years of education: Not on file  . Highest education level: Not on file  Social Needs  . Financial resource strain: Not on file  . Food insecurity - worry: Not on file  . Food insecurity - inability: Not on file  . Transportation needs - medical: Not on file  . Transportation needs - non-medical: Not on file  Occupational History  . Not on file    Tobacco Use  . Smoking status: Former Smoker    Packs/day: 1.00    Years: 50.00    Pack years: 50.00    Types: Cigarettes    Last attempt to quit: 01/14/2009    Years since quitting: 8.2  . Smokeless tobacco: Never Used  Substance and Sexual Activity  . Alcohol use: Yes    Alcohol/week: 5.4 oz    Types: 3 Glasses of wine, 3 Cans of beer, 3 Shots of liquor per week  . Drug use: No  . Sexual activity: Not on file  Other Topics Concern  . Not on file  Social History Narrative  . Not on file    Current Outpatient Medications on File Prior to Visit  Medication Sig Dispense Refill  . aspirin 81 MG tablet Take 81 mg by mouth daily. Reported on 04/22/2015    . Blood Glucose Monitoring Suppl (ACCU-CHEK AVIVA PLUS) w/Device KIT Use to check blood sugar 4 times per day. Dx code E10.65 1 kit 2  . cilostazol (PLETAL) 100 MG tablet Take 1 tablet (100 mg total) by mouth 2 (two) times daily before a meal. 180 tablet 3  . glucose blood (ACCU-CHEK AVIVA PLUS) test strip Use to check blood sugar 4 times per day. 400 each 2  . insulin NPH-regular Human (NOVOLIN 70/30) (70-30) 100 UNIT/ML injection Inject 30 Units into the skin daily  with breakfast. 10 mL 11  . Insulin Syringe-Needle U-100 (INSULIN SYRINGE .5CC/30GX5/16") 30G X 5/16" 0.5 ML MISC Use to inject insulin 2 times per day. DISPENSE THE 8 MM SIZE 200 each 2  . lisinopril (PRINIVIL,ZESTRIL) 5 MG tablet Take 5 mg by mouth daily.    . pantoprazole (PROTONIX) 40 MG tablet     . rosuvastatin (CRESTOR) 40 MG tablet Take 40 mg by mouth daily.     No current facility-administered medications on file prior to visit.     Allergies  Allergen Reactions  . Neomycin Other (See Comments)    unknown  . Tea Tree Oil Other (See Comments)    unknown  . Simvastatin   . Sulfa Antibiotics Other (See Comments)    unknown  . Other Rash    Chlorhexidine gluconate    Family History  Problem Relation Age of Onset  . Hyperlipidemia Mother   . Cancer  Mother   . Hyperlipidemia Father   . Cancer Father   . Diabetes Neg Hx     BP (!) 142/60 (BP Location: Left Arm, Patient Position: Sitting, Cuff Size: Normal)   Pulse 66   Wt 131 lb 3.2 oz (59.5 kg)   SpO2 97%   BMI 21.18 kg/m    Review of Systems Denies LOC    Objective:   Physical Exam VITAL SIGNS:  See vs page GENERAL: no distress Pulses: dorsalis pedis intact bilat.   MSK: no deformity of the feet CV: trace bilat leg edema Skin:  no ulcer on the feet.  normal color and temp on the feet. Neuro: sensation is intact to touch on the feet Ext: There is bilateral onychomycosis of the toenails   A1c=6.6%    Assessment & Plan:  Type 1 DM, with CAD: overcontrolled, given this regimen, which does match insulin to his changing needs throughout the day.   Hypoglycemia, due to insulin.  Noncompliance with insulin dosing: I advised pt of risks.  Patient Instructions  check your blood sugar 4 times a day: before the 3 meals, and at bedtime.  also check if you have symptoms of your blood sugar being too high or too low.  please keep a record of the readings and bring it to your next appointment here.  You can write it on any piece of paper.  please call us sooner if your blood sugar goes below 70, or if you have a lot of readings over 200.   On this type of insulin schedule, you should eat meals on a regular schedule.  If a meal is missed or significantly delayed, your blood sugar could go low.     Please continue the 70/30 insulin, 30 units with breakfast, but avoid the reg insulin.  Please come back for a follow-up appointment in 4 months.

## 2017-05-05 DIAGNOSIS — E1051 Type 1 diabetes mellitus with diabetic peripheral angiopathy without gangrene: Secondary | ICD-10-CM | POA: Diagnosis not present

## 2017-05-10 ENCOUNTER — Ambulatory Visit (INDEPENDENT_AMBULATORY_CARE_PROVIDER_SITE_OTHER)
Admission: RE | Admit: 2017-05-10 | Discharge: 2017-05-10 | Disposition: A | Discharge status: Home / Self Care | Payer: Medicare HMO | Source: Ambulatory Visit | Attending: Family | Admitting: Family

## 2017-05-10 ENCOUNTER — Encounter: Payer: Self-pay | Admitting: Family

## 2017-05-10 ENCOUNTER — Ambulatory Visit (HOSPITAL_COMMUNITY)
Admission: RE | Admit: 2017-05-10 | Discharge: 2017-05-10 | Disposition: A | Discharge status: Home / Self Care | Payer: Medicare HMO | Source: Ambulatory Visit | Attending: Family | Admitting: Family

## 2017-05-10 ENCOUNTER — Ambulatory Visit (INDEPENDENT_AMBULATORY_CARE_PROVIDER_SITE_OTHER): Payer: Medicare HMO | Admitting: Family

## 2017-05-10 VITALS — BP 133/62 | HR 65 | Temp 97.0°F | Resp 14 | Wt 131.0 lb

## 2017-05-10 DIAGNOSIS — Z87891 Personal history of nicotine dependence: Secondary | ICD-10-CM

## 2017-05-10 DIAGNOSIS — E1051 Type 1 diabetes mellitus with diabetic peripheral angiopathy without gangrene: Secondary | ICD-10-CM

## 2017-05-10 DIAGNOSIS — Z95828 Presence of other vascular implants and grafts: Secondary | ICD-10-CM | POA: Diagnosis not present

## 2017-05-10 DIAGNOSIS — Z8679 Personal history of other diseases of the circulatory system: Secondary | ICD-10-CM

## 2017-05-10 LAB — VAS US LOWER EXTREMITY ARTERIAL DUPLEX
LSFMPSV: -105 cm/s
Left ant tibial distal sys: 34 cm/s
Left super femoral dist sys PSV: -105 cm/s
Left super femoral prox sys PSV: 125 cm/s
RIGHT ANT DIST TIBAL SYS PSV: 20 cm/s
RSFDPSV: -45 cm/s
RSFMPSV: 0 cm/s
RSFPPSV: 110 cm/s
RTIBDISTSYS: 48 cm/s
left post tibial dist sys: -43 cm/s

## 2017-05-10 NOTE — Progress Notes (Signed)
[facility] [place]   CC: Follow up peripheral artery occlusive disease  History of Present Illness Steven Sanders is a 77 y.o. male who is s/paortobifemoral bypass graft using a 14 x 8 dacron graft on 08/11/2013 by Dr. Trula Slade. This was done for lifestyle limiting claudication. He has a history of lower extremity ulceration which ultimately healed. He has a known right SFA occlusion.   He walks 1-5 miles/day, 6-7 days/week. He isridinga stationary bike for a mile some days and does resistance exercises 2-3 days/week. He has a treadmill and stationary bike at home.  He denies claudication sx's with walking on flat surface, has mild bilateral calf claudication walking up slight hills, denies non healing wounds in his lower extremities. He resumed cilostizol use about February 2018.     The patient denies any history of TIA or stroke symptoms, specifically the patient denies a history of amaurosis fugax or monocular blindness, unilateral facial drooping, hemiplegia, and denies a history or expressive aphasia.   The patient has nothad back or abdominal pain.  Pt Diabetic: Yes, diagnosed at age 77, 6.6 A1C on 04-26-17(review of records). He wears a continuous glucose monitor. He denies any peripheral neuropathy sx's.  Pt smoker: former smoker, quit in 2010  Pt meds include: Statin :Yes Betablocker: No ASA: Yes Other anticoagulants/antiplatelets: no    Past Medical History:  Diagnosis Date  . COPD (chronic obstructive pulmonary disease) ([place])    stopped smoking 5 years ago  . Diabetes mellitus   . GERD (gastroesophageal reflux disease)   . Hypercholesterolemia   . Hypertension   . Peripheral vascular disease [facility])     Social History Social History   Tobacco Use  . Smoking status: Former Smoker    Packs/day: 1.00    Years: 50.00    Pack years: 50.00    Types: Cigarettes    Last attempt to quit: 01/14/2009    Years since quitting: 8.3  .  Smokeless tobacco: Never Used  Substance Use Topics  . Alcohol use: Yes    Alcohol/week: 5.4 oz    Types: 3 Glasses of wine, 3 Cans of beer, 3 Shots of liquor per week  . Drug use: No    Family History Family History  Problem Relation Age of Onset  . Hyperlipidemia Mother   . Cancer Mother   . Hyperlipidemia Father   . Cancer Father   . Diabetes Neg Hx     Past Surgical History:  Procedure Laterality Date  . AORTA - BILATERAL FEMORAL ARTERY BYPASS GRAFT N/A 08/11/2013   Procedure: AORTA BIFEMORAL BYPASS GRAFT;  Surgeon: Serafina Mitchell, MD;  Location: [place];  Service: Vascular;  Laterality: N/A;  . COLONOSCOPY    . COLONOSCOPY W/ BIOPSIES AND POLYPECTOMY  2008  . HERNIA REPAIR      Allergies  Allergen Reactions  . Neomycin Other (See Comments)    unknown  . Tea Tree Oil Other (See Comments)    unknown  . Simvastatin   . Sulfa Antibiotics Other (See Comments)    unknown  . Other Rash    Chlorhexidine gluconate    Current Outpatient Medications  Medication Sig Dispense Refill  . aspirin 81 MG tablet Take 81 mg by mouth daily. Reported on 04/22/2015    . Blood Glucose Monitoring Suppl (ACCU-CHEK AVIVA PLUS) w/Device KIT Use to check blood sugar 4 times per day. Dx code E10.65 1 kit 2  . cilostazol (PLETAL) 100 MG tablet Take 1 tablet (  100 mg total) by mouth 2 (two) times daily before a meal. 180 tablet 3  . glucose blood (ACCU-CHEK AVIVA PLUS) test strip Use to check blood sugar 4 times per day. 400 each 2  . insulin NPH-regular Human (NOVOLIN 70/30) (70-30) 100 UNIT/ML injection Inject 30 Units into the skin daily with breakfast. 10 mL 11  . Insulin Syringe-Needle U-100 (INSULIN SYRINGE .5CC/30GX5/16") 30G X 5/16" 0.5 ML MISC Use to inject insulin 2 times per day. DISPENSE THE 8 MM SIZE 200 each 2  . lisinopril (PRINIVIL,ZESTRIL) 5 MG tablet Take 5 mg by mouth daily.    . pantoprazole (PROTONIX) 40 MG tablet     . rosuvastatin (CRESTOR) 40 MG tablet Take 40 mg by mouth  daily.     No current facility-administered medications for this visit.     ROS: See HPI for pertinent positives and negatives.   Physical Examination  Vitals:   05/10/17 0953  BP: 133/62  Pulse: 65  Resp: 14  Temp: (!) 97 F (36.1 C)  TempSrc: Oral  SpO2: 98%  Weight: 131 lb (59.4 kg)   Body mass index is 21.14 kg/m.  General: WDWN male in NAD Gait: Normal HENT: WNL Eyes: PERRLA Pulmonary: normal non-labored breathing, no rales, rhonchi, or wheezing Cardiac: RRR, no murmur detected Abdomen: soft, NT, no masses palpated. CGM device in place [place]. Skin: no rashes, no ulcers, no cellulitis.   VASCULAR EXAM  Carotid Bruits Right Left   Negative negative   Abdominal aortic pulse is not palpable Radial pulses are 2+ palpable and =   VASCULAR EXAM: Extremitieswithoutischemic changes, withoutGangrene; no open wounds.   LE Pulses Right Left  FEMORAL 3+ palpable 3+ palpable   POPLITEAL not palpable  2+ palpable  POSTERIOR TIBIAL not palpable  notpalpable   DORSALIS PEDIS ANTERIOR TIBIAL not palpable  2+ palpable     Musculoskeletal: no muscle wasting or atrophy.  Neurologic: A&O X 3; appropriate affect, Sensation is normal; MOTOR FUNCTION:  moving all extremities equally, motor strength 5/5 throughout. Speech is fluent/normal. CN 2-12 intact. Psychiatric: Thought content is normal, mood appropriate for clinical situation.     ASSESSMENT: Steven Sanders is a 77 y.o. male who is s/paortobifemoral bypass graft using a 14 x 8 dacryon graft on 08/11/2013. This was done for lifestyle limiting claudication. He has a history of lower extremity ulceration which ultimately healed. He has a known right SFA occlusion.   He walks a mile in 20 minutes, 2-3 days/week. He rides a bike for a mile and does resistance exercises 2-3 days/week. He has no claudication sx's with walking, no  signs of ischemia in his LE's.  ABI's and bilateral LE arterial duplex today indicate that pt has developed extensive collateral revascularization in both legs by his intensive walking program.   He has no hx of stroke or TIA.   His atherosclerotic risk factors include controlled Type 1 DM and former smoker.    DATA  Aortoiliac Duplex (05/10/17): Patent aortobifemoral bypass graft with no evidence of restenosis. All biphasic waveforms.   Aortoiliac duplex 04-13-16): Decreased visualization of the abdominal vasculature due to overlying bowel gas. Patent aorto-bifemoral bypass graft without evidence of restenosis; however, the left distal anastomosis velocity (180 cm/s) is somewhat elevated without disease observed.   Bilateral LE Arterial Duplex (05/10/17): Occlusion noted in the right SFA. Significant collateral vessels distal to right SFA with all biphasic waveforms distal to occluded right SFA. Significant plaque and collateral vessel throughout the left SFA. All  biphasic waveforms in the left LE except monophasic at left DFA with velocity of 212 cm/s.  Stable in the right LE, improved in the left LE compared to the exam on 10-13-16.   Bilateral LE Arterial Duplex (10/13/16): Bilateral outflow velocities and waveforms suggest patent ABFBG. Right mid SFA occlusion with distal vessel filled with collaterals. Left distal SFA/popliteal artery occlusion with distal vessel filled via collaterals. No previous scan of the lower extremity arteries.    ABI (Date: 05/10/2017):  R:   ABI: 0.76 (was 0.76 on 10-13-16),   PT: bi  DP: bi  TBI:  0.26 (was 0.41)  L:   ABI: 0.74 (was 0.72),   PT: bi  DP: mono  TBI: 0.31 (was 0.51)  Stable bilateral ABI with moderate disease, bi and monophasic waveforms. Slight decline in bilateral TBI.    Carotid duplex 04-13-16: <40% bilateral ICA stenosis. Bilateral vertebral artery flow is antegrade.  Bilateral subclavian artery  waveforms are normal.  No significant change compared to the last exam on 04-03-15.    PLAN:   Continue fairly intensive exercise regimen.   Based on today's exam and non-invasive vascular lab results, the patient will follow up in 9 monthswith the following tests: ABI's.    Bilateral LE arterial duplex,bilateral aortoiliac duplex, carotid duplex in 1 year following that visit.   I advised the pt to notify our office if his claudication worsens or he develops non healing wounds .     I discussed in depth with the patient the nature of atherosclerosis, and emphasized the importance of maximal medical management including strict control of blood pressure, blood glucose, and lipid levels, obtaining regular exercise, and continued cessation of smoking.  The patient is aware that without maximal medical management the underlying atherosclerotic disease process will progress, limiting the benefit of any interventions.  The patient was given information about PAD including signs, symptoms, treatment, what symptoms should prompt the patient to seek immediate medical care, and risk reduction measures to take.  Clemon Chambers, RN, MSN, FNP-C [facility] Phone: [phone]  Clinic MD: Trula Slade  05/10/17 10:17 AM

## 2017-05-10 NOTE — Patient Instructions (Addendum)

## 2017-05-11 ENCOUNTER — Encounter (HOSPITAL_COMMUNITY): Payer: Medicare HMO

## 2017-05-11 ENCOUNTER — Ambulatory Visit: Payer: Medicare HMO | Admitting: Family

## 2017-05-14 ENCOUNTER — Other Ambulatory Visit: Payer: Self-pay | Admitting: Endocrinology

## 2017-05-14 ENCOUNTER — Other Ambulatory Visit: Payer: Self-pay | Admitting: Family

## 2017-05-14 DIAGNOSIS — Z95828 Presence of other vascular implants and grafts: Secondary | ICD-10-CM

## 2017-05-14 DIAGNOSIS — Z8679 Personal history of other diseases of the circulatory system: Secondary | ICD-10-CM

## 2017-05-14 DIAGNOSIS — Z87891 Personal history of nicotine dependence: Secondary | ICD-10-CM

## 2017-05-14 DIAGNOSIS — R0989 Other specified symptoms and signs involving the circulatory and respiratory systems: Secondary | ICD-10-CM

## 2017-05-26 ENCOUNTER — Other Ambulatory Visit: Payer: Self-pay

## 2017-05-26 DIAGNOSIS — I739 Peripheral vascular disease, unspecified: Secondary | ICD-10-CM

## 2017-05-26 DIAGNOSIS — I779 Disorder of arteries and arterioles, unspecified: Secondary | ICD-10-CM

## 2017-05-26 DIAGNOSIS — I7409 Other arterial embolism and thrombosis of abdominal aorta: Secondary | ICD-10-CM

## 2017-06-21 ENCOUNTER — Telehealth: Payer: Self-pay | Admitting: Endocrinology

## 2017-06-21 NOTE — Telephone Encounter (Addendum)
Patient is calling on the status of a formed that supposed to be fill out so he can get his dex com supplies. Please advise   [phone]

## 2017-06-23 ENCOUNTER — Telehealth: Payer: Self-pay | Admitting: Endocrinology

## 2017-06-23 DIAGNOSIS — Z0000 Encounter for general adult medical examination without abnormal findings: Secondary | ICD-10-CM | POA: Diagnosis not present

## 2017-06-23 DIAGNOSIS — E1029 Type 1 diabetes mellitus with other diabetic kidney complication: Secondary | ICD-10-CM | POA: Diagnosis not present

## 2017-06-23 DIAGNOSIS — I739 Peripheral vascular disease, unspecified: Secondary | ICD-10-CM | POA: Diagnosis not present

## 2017-06-23 DIAGNOSIS — G629 Polyneuropathy, unspecified: Secondary | ICD-10-CM | POA: Diagnosis not present

## 2017-06-23 DIAGNOSIS — E1139 Type 2 diabetes mellitus with other diabetic ophthalmic complication: Secondary | ICD-10-CM | POA: Diagnosis not present

## 2017-06-23 DIAGNOSIS — E1129 Type 2 diabetes mellitus with other diabetic kidney complication: Secondary | ICD-10-CM | POA: Diagnosis not present

## 2017-06-23 DIAGNOSIS — R809 Proteinuria, unspecified: Secondary | ICD-10-CM | POA: Diagnosis not present

## 2017-06-23 DIAGNOSIS — E13319 Other specified diabetes mellitus with unspecified diabetic retinopathy without macular edema: Secondary | ICD-10-CM | POA: Diagnosis not present

## 2017-06-23 DIAGNOSIS — E10319 Type 1 diabetes mellitus with unspecified diabetic retinopathy without macular edema: Secondary | ICD-10-CM | POA: Diagnosis not present

## 2017-06-23 DIAGNOSIS — I700 Atherosclerosis of aorta: Secondary | ICD-10-CM | POA: Diagnosis not present

## 2017-06-23 DIAGNOSIS — E1149 Type 2 diabetes mellitus with other diabetic neurological complication: Secondary | ICD-10-CM | POA: Diagnosis not present

## 2017-06-23 DIAGNOSIS — E7800 Pure hypercholesterolemia, unspecified: Secondary | ICD-10-CM | POA: Diagnosis not present

## 2017-06-23 DIAGNOSIS — D692 Other nonthrombocytopenic purpura: Secondary | ICD-10-CM | POA: Diagnosis not present

## 2017-06-23 DIAGNOSIS — E1040 Type 1 diabetes mellitus with diabetic neuropathy, unspecified: Secondary | ICD-10-CM | POA: Diagnosis not present

## 2017-06-23 NOTE — Telephone Encounter (Signed)
I have faxed OV notes.  

## 2017-06-23 NOTE — Telephone Encounter (Signed)
Patient came by & I am faxing OV notes to [employer].

## 2017-07-01 ENCOUNTER — Telehealth: Payer: Self-pay | Admitting: Endocrinology

## 2017-07-01 NOTE — Telephone Encounter (Signed)
Patient would like a call back to discuss a Diabetic monitor.  Please advise

## 2017-07-02 DIAGNOSIS — E1051 Type 1 diabetes mellitus with diabetic peripheral angiopathy without gangrene: Secondary | ICD-10-CM | POA: Diagnosis not present

## 2017-07-07 NOTE — Telephone Encounter (Signed)
LVM for patient to call back. ?

## 2017-07-20 ENCOUNTER — Other Ambulatory Visit: Payer: Self-pay | Admitting: Endocrinology

## 2017-08-05 DIAGNOSIS — E1051 Type 1 diabetes mellitus with diabetic peripheral angiopathy without gangrene: Secondary | ICD-10-CM | POA: Diagnosis not present

## 2017-08-10 ENCOUNTER — Telehealth: Payer: Self-pay | Admitting: *Deleted

## 2017-08-10 NOTE — Telephone Encounter (Signed)
Patient called c/o significant swelling in left leg about the knee down. Decreased cap. Refill on left foot. Feet are always cold and decreased sensation due to neuropathy. Claim pain is present when elevates leg and swelling does not go down at during the night. Very concerned.  and requested to be seen. Walks multiple miles per day.

## 2017-08-11 ENCOUNTER — Ambulatory Visit: Payer: Medicare HMO | Admitting: Physician Assistant

## 2017-08-11 ENCOUNTER — Other Ambulatory Visit: Payer: Self-pay

## 2017-08-11 VITALS — BP 137/50 | HR 59 | Temp 97.9°F | Resp 16 | Ht 66.0 in | Wt 136.0 lb

## 2017-08-11 DIAGNOSIS — I739 Peripheral vascular disease, unspecified: Secondary | ICD-10-CM | POA: Diagnosis not present

## 2017-08-11 DIAGNOSIS — I7409 Other arterial embolism and thrombosis of abdominal aorta: Secondary | ICD-10-CM

## 2017-08-11 DIAGNOSIS — I779 Disorder of arteries and arterioles, unspecified: Secondary | ICD-10-CM

## 2017-08-11 NOTE — Progress Notes (Signed)
Established Previous Bypass   History of Present Illness   Steven Sanders is a 77 y.o. (09-21-1940) male who presents with chief complaint: Progressive edema of left lower extremity.  He is followed for PAD with surgical history significant for aortobifemoral bypass graft by Dr. Trula Slade 08/11/2013.  ABIs as of 2/19 are 0.7 bilaterally.  Since office visit in February of this year he has noticed increase in swelling of his left leg.  Swelling is not bothersome or painful to the patient however he is concerned that this could indicate trouble with circulation.  He continues to walk 5 to 7 miles most days of the week only experiencing claudication in his left calf if walking several minutes up a steady incline at a brisk pace.  He denies any rest pain or active tissue ischemia of bilateral lower extremities.  He is taking an aspirin and a statin daily.  He has not made attempts to elevate his leg during the day.  He does not report any long periods of inactivity over the past couple weeks and denies left calf pain.      The patient's PMH, PSH, SH, and FamHx were reviewed on today's visit and are unchanged from prior visit.  Current Outpatient Medications  Medication Sig Dispense Refill  . ACCU-CHEK AVIVA PLUS test strip CHECK BLOOD SUGAR FOUR TIMES DAILY 400 each 2  . aspirin 81 MG tablet Take 81 mg by mouth daily. Reported on 04/22/2015    . BD INSULIN SYRINGE U/F 31G X 5/16" 0.5 ML MISC USE TO INJECT INSULIN 2 TIMES PER DAY 180 each 4  . Blood Glucose Monitoring Suppl (ACCU-CHEK AVIVA PLUS) w/Device KIT USE AS DIRECTED 1 kit 0  . cilostazol (PLETAL) 100 MG tablet TAKE 1 TABLET TWICE DAILY BEFORE MEALS 180 tablet 3  . insulin NPH-regular Human (NOVOLIN 70/30) (70-30) 100 UNIT/ML injection Inject 30 Units into the skin daily with breakfast. 10 mL 11  . Insulin Syringe-Needle U-100 (INSULIN SYRINGE .5CC/30GX5/16") 30G X 5/16" 0.5 ML MISC Use to inject insulin 2 times per day. DISPENSE THE 8 MM  SIZE 200 each 2  . lisinopril (PRINIVIL,ZESTRIL) 5 MG tablet Take 5 mg by mouth daily.    . pantoprazole (PROTONIX) 40 MG tablet     . rosuvastatin (CRESTOR) 40 MG tablet Take 40 mg by mouth daily.     No current facility-administered medications for this visit.     On ROS today: 10 system ROS is negative unless otherwise noted in HPI   Physical Examination   Vitals:   08/11/17 1310  BP: (!) 137/50  Pulse: (!) 59  Resp: 16  Temp: 97.9 F (36.6 C)  TempSrc: Oral  SpO2: 96%  Weight: 136 lb (61.7 kg)  Height: _0  (1.676 m)   Body mass index is 21.95 kg/m.  General Alert, O x 3, WD, NAD  Pulmonary Sym exp, good B air movt,   Cardiac RRR, Nl S1, S2,   Vascular Vessel Right Left  Radial Palpable Palpable  Brachial Palpable Palpable  Aorta Not palpable N/A  PT Faintly palpable Not palpable  DP Not palpable Faintly palpable    Gastro- intestinal soft, non-distended,   Musculo- skeletal  pitting edema left lower extremity to the level of the proximal ankle; no left calf pain with palpation and dorsiflexion M/S 5/5 throughout  , Extremities without ischemic changes  , , No visible varicosities ,   Neurologic Pain and light touch intact in extremities , Motor exam  as listed above     Medical Decision Making   [name] is a 77 y.o. male who presents with progressive edema of left lower extremity.   Despite known infra inguinal arterial disease patient continues to be without claudication symptoms likely due to extensive exercise program  Low suspicion for DVT left lower extremity based on physical exam  Recommended elevating left leg when possible during the day  Also recommended compression stocking for left leg if swelling becomes bothersome to the patient; information about [place] elastic outlet provided to the patient  He will follow-up as scheduled with ABIs, graft surveillance, and carotid duplex in 1 year  He will notify the office if edema worsens  or becomes bothersome  Dagoberto Ligas, PA-C [facility] [place] Office: [phone]

## 2017-08-12 DIAGNOSIS — E1129 Type 2 diabetes mellitus with other diabetic kidney complication: Secondary | ICD-10-CM | POA: Diagnosis not present

## 2017-08-12 DIAGNOSIS — E7800 Pure hypercholesterolemia, unspecified: Secondary | ICD-10-CM | POA: Diagnosis not present

## 2017-08-12 DIAGNOSIS — E1139 Type 2 diabetes mellitus with other diabetic ophthalmic complication: Secondary | ICD-10-CM | POA: Diagnosis not present

## 2017-08-12 DIAGNOSIS — E1149 Type 2 diabetes mellitus with other diabetic neurological complication: Secondary | ICD-10-CM | POA: Diagnosis not present

## 2017-08-26 DIAGNOSIS — H40013 Open angle with borderline findings, low risk, bilateral: Secondary | ICD-10-CM | POA: Diagnosis not present

## 2017-08-26 DIAGNOSIS — H25013 Cortical age-related cataract, bilateral: Secondary | ICD-10-CM | POA: Diagnosis not present

## 2017-08-26 DIAGNOSIS — E113293 Type 2 diabetes mellitus with mild nonproliferative diabetic retinopathy without macular edema, bilateral: Secondary | ICD-10-CM | POA: Diagnosis not present

## 2017-08-26 DIAGNOSIS — H2513 Age-related nuclear cataract, bilateral: Secondary | ICD-10-CM | POA: Diagnosis not present

## 2017-09-06 DIAGNOSIS — E1051 Type 1 diabetes mellitus with diabetic peripheral angiopathy without gangrene: Secondary | ICD-10-CM | POA: Diagnosis not present

## 2017-10-01 DIAGNOSIS — E1149 Type 2 diabetes mellitus with other diabetic neurological complication: Secondary | ICD-10-CM | POA: Diagnosis not present

## 2017-10-01 DIAGNOSIS — Z794 Long term (current) use of insulin: Secondary | ICD-10-CM | POA: Diagnosis not present

## 2017-10-01 DIAGNOSIS — E1129 Type 2 diabetes mellitus with other diabetic kidney complication: Secondary | ICD-10-CM | POA: Diagnosis not present

## 2017-10-01 DIAGNOSIS — E13319 Other specified diabetes mellitus with unspecified diabetic retinopathy without macular edema: Secondary | ICD-10-CM | POA: Diagnosis not present

## 2017-10-01 DIAGNOSIS — E1139 Type 2 diabetes mellitus with other diabetic ophthalmic complication: Secondary | ICD-10-CM | POA: Diagnosis not present

## 2017-10-25 ENCOUNTER — Encounter: Payer: Self-pay | Admitting: Endocrinology

## 2017-10-25 ENCOUNTER — Ambulatory Visit: Payer: Medicare HMO | Admitting: Endocrinology

## 2017-10-25 VITALS — BP 134/61 | HR 63 | Ht 66.0 in | Wt 134.0 lb

## 2017-10-25 DIAGNOSIS — E1051 Type 1 diabetes mellitus with diabetic peripheral angiopathy without gangrene: Secondary | ICD-10-CM

## 2017-10-25 LAB — POCT GLYCOSYLATED HEMOGLOBIN (HGB A1C): Hemoglobin A1C: 6.5 % — AB (ref 4.0–5.6)

## 2017-10-25 NOTE — Patient Instructions (Addendum)
check your blood sugar 4 times a day: before the 3 meals, and at bedtime.  also check if you have symptoms of your blood sugar being too high or too low.  please keep a record of the readings and bring it to your next appointment here.  You can write it on any piece of paper.  please call us sooner if your blood sugar goes below 70, or if you have a lot of readings over 200.   Please avoid the regular insulin, as your a1c is too low.   On this type of insulin schedule, you should eat meals on a regular schedule.  If a meal is missed or significantly delayed, your blood sugar could go low.   Please continue the 70/30 insulin, 30 units with breakfast.  Please come back for a follow-up appointment in 4 months.

## 2017-10-25 NOTE — Progress Notes (Signed)
Subjective:    Patient ID: Steven Sanders, male    DOB: 07-08-1940, 77 y.o.   MRN: [id number]  HPI Pt returns for f/u of diabetes mellitus: DM type: 1 Dx'ed: 6213 Complications: polyneuropathy, nephropathy, retinopathy, and PAD.  Therapy: insulin since dx.   DKA: never. Severe hypoglycemia: multiple episodes, most recently in 2015.   Pancreatitis: never.  Other: he has declined multiple daily injections; he has a dexcom-5 continuous glucose monitor.   Interval history: He still takes 70/30, 30 units qam  He still takes prn reg insulin (averages approx 3 total units/day).  We reviewed continuous glucose monitor information together.  It varies from  50-180.  It is in general lowest at lunch.  There is otherwise no trend throughout the day.  pt states he feels well in general.  Past Medical History:  Diagnosis Date  . COPD (chronic obstructive pulmonary disease) ([place])    stopped smoking 5 years ago  . Diabetes mellitus   . GERD (gastroesophageal reflux disease)   . Hypercholesterolemia   . Hypertension   . Peripheral vascular disease [facility])     Past Surgical History:  Procedure Laterality Date  . AORTA - BILATERAL FEMORAL ARTERY BYPASS GRAFT N/A 08/11/2013   Procedure: AORTA BIFEMORAL BYPASS GRAFT;  Surgeon: Serafina Mitchell, MD;  Location: [place];  Service: Vascular;  Laterality: N/A;  . COLONOSCOPY    . COLONOSCOPY W/ BIOPSIES AND POLYPECTOMY  2008  . HERNIA REPAIR      Social History   Socioeconomic History  . Marital status: Widowed    Spouse name: Not on file  . Number of children: Not on file  . Years of education: Not on file  . Highest education level: Not on file  Occupational History  . Not on file  Social Needs  . Financial resource strain: Not on file  . Food insecurity:    Worry: Not on file    Inability: Not on file  . Transportation needs:    Medical: Not on file    Non-medical: Not on file  Tobacco Use  . Smoking status: Former Smoker   Packs/day: 1.00    Years: 50.00    Pack years: 50.00    Types: Cigarettes    Last attempt to quit: 01/14/2009    Years since quitting: 8.7  . Smokeless tobacco: Never Used  Substance and Sexual Activity  . Alcohol use: Yes    Alcohol/week: 9.0 standard drinks    Types: 3 Glasses of wine, 3 Cans of beer, 3 Shots of liquor per week  . Drug use: No  . Sexual activity: Not on file  Lifestyle  . Physical activity:    Days per week: Not on file    Minutes per session: Not on file  . Stress: Not on file  Relationships  . Social connections:    Talks on phone: Not on file    Gets together: Not on file    Attends religious service: Not on file    Active member of club or organization: Not on file    Attends meetings of clubs or organizations: Not on file    Relationship status: Not on file  . Intimate partner violence:    Fear of current or ex partner: Not on file    Emotionally abused: Not on file    Physically abused: Not on file    Forced sexual activity: Not on file  Other Topics Concern  . Not on file  Social History  Narrative  . Not on file    Current Outpatient Medications on File Prior to Visit  Medication Sig Dispense Refill  . ACCU-CHEK AVIVA PLUS test strip CHECK BLOOD SUGAR FOUR TIMES DAILY 400 each 2  . aspirin 81 MG tablet Take 81 mg by mouth daily. Reported on 04/22/2015    . Blood Glucose Monitoring Suppl (ACCU-CHEK AVIVA PLUS) w/Device KIT USE AS DIRECTED 1 kit 0  . cilostazol (PLETAL) 100 MG tablet TAKE 1 TABLET TWICE DAILY BEFORE MEALS 180 tablet 3  . insulin NPH-regular Human (NOVOLIN 70/30) (70-30) 100 UNIT/ML injection Inject 30 Units into the skin daily with breakfast. 10 mL 11  . Insulin Syringe-Needle U-100 (INSULIN SYRINGE .5CC/30GX5/16") 30G X 5/16" 0.5 ML MISC Use to inject insulin 2 times per day. DISPENSE THE 8 MM SIZE 200 each 2  . lisinopril (PRINIVIL,ZESTRIL) 5 MG tablet Take 5 mg by mouth daily.    . pantoprazole (PROTONIX) 40 MG tablet     .  rosuvastatin (CRESTOR) 40 MG tablet Take 40 mg by mouth daily.     No current facility-administered medications on file prior to visit.     Allergies  Allergen Reactions  . Neomycin Other (See Comments)    unknown  . Tea Tree Oil Other (See Comments)    unknown  . Simvastatin   . Sulfa Antibiotics Other (See Comments)    unknown  . Other Rash    Chlorhexidine gluconate    Family History  Problem Relation Age of Onset  . Hyperlipidemia Mother   . Cancer Mother   . Hyperlipidemia Father   . Cancer Father   . Diabetes Neg Hx     BP 134/61 (BP Location: Left Arm, Patient Position: Sitting, Cuff Size: Small)   Pulse 63   Ht _0  (1.676 m)   Wt 134 lb (60.8 kg)   SpO2 99%   BMI 21.63 kg/m    Review of Systems He denies LOC.      Objective:   Physical Exam VITAL SIGNS:  See vs page GENERAL: no distress Pulses: dorsalis pedis intact bilat.   MSK: no deformity of the feet CV: trace bilat leg edema Skin:  no ulcer on the feet.  normal color and temp on the feet. Neuro: sensation is intact to touch on the feet Ext: There is bilateral onychomycosis of the toenails.        Assessment & Plan:  Type 1 DM, with PAD: overcontrolled Hypoglycemia: this is possibly due to reg insulin Noncompliance with insulin dosing: we discussed the importance of stopping reg insulin  Patient Instructions  check your blood sugar 4 times a day: before the 3 meals, and at bedtime.  also check if you have symptoms of your blood sugar being too high or too low.  please keep a record of the readings and bring it to your next appointment here.  You can write it on any piece of paper.  please call us sooner if your blood sugar goes below 70, or if you have a lot of readings over 200.   Please avoid the regular insulin, as your a1c is too low.   On this type of insulin schedule, you should eat meals on a regular schedule.  If a meal is missed or significantly delayed, your blood sugar could go low.    Please continue the 70/30 insulin, 30 units with breakfast.  Please come back for a follow-up appointment in 4 months.

## 2017-11-04 DIAGNOSIS — E1051 Type 1 diabetes mellitus with diabetic peripheral angiopathy without gangrene: Secondary | ICD-10-CM | POA: Diagnosis not present

## 2017-12-24 DIAGNOSIS — E13319 Other specified diabetes mellitus with unspecified diabetic retinopathy without macular edema: Secondary | ICD-10-CM | POA: Diagnosis not present

## 2017-12-24 DIAGNOSIS — G629 Polyneuropathy, unspecified: Secondary | ICD-10-CM | POA: Diagnosis not present

## 2017-12-24 DIAGNOSIS — I700 Atherosclerosis of aorta: Secondary | ICD-10-CM | POA: Diagnosis not present

## 2017-12-24 DIAGNOSIS — E1051 Type 1 diabetes mellitus with diabetic peripheral angiopathy without gangrene: Secondary | ICD-10-CM | POA: Diagnosis not present

## 2017-12-24 DIAGNOSIS — E1129 Type 2 diabetes mellitus with other diabetic kidney complication: Secondary | ICD-10-CM | POA: Diagnosis not present

## 2017-12-24 DIAGNOSIS — E1029 Type 1 diabetes mellitus with other diabetic kidney complication: Secondary | ICD-10-CM | POA: Diagnosis not present

## 2017-12-24 DIAGNOSIS — D692 Other nonthrombocytopenic purpura: Secondary | ICD-10-CM | POA: Diagnosis not present

## 2017-12-24 DIAGNOSIS — R809 Proteinuria, unspecified: Secondary | ICD-10-CM | POA: Diagnosis not present

## 2017-12-24 DIAGNOSIS — E1040 Type 1 diabetes mellitus with diabetic neuropathy, unspecified: Secondary | ICD-10-CM | POA: Diagnosis not present

## 2017-12-24 DIAGNOSIS — I739 Peripheral vascular disease, unspecified: Secondary | ICD-10-CM | POA: Diagnosis not present

## 2017-12-24 DIAGNOSIS — E1139 Type 2 diabetes mellitus with other diabetic ophthalmic complication: Secondary | ICD-10-CM | POA: Diagnosis not present

## 2017-12-24 DIAGNOSIS — E10319 Type 1 diabetes mellitus with unspecified diabetic retinopathy without macular edema: Secondary | ICD-10-CM | POA: Diagnosis not present

## 2017-12-24 DIAGNOSIS — E7800 Pure hypercholesterolemia, unspecified: Secondary | ICD-10-CM | POA: Diagnosis not present

## 2017-12-24 DIAGNOSIS — E1149 Type 2 diabetes mellitus with other diabetic neurological complication: Secondary | ICD-10-CM | POA: Diagnosis not present

## 2017-12-24 DIAGNOSIS — K219 Gastro-esophageal reflux disease without esophagitis: Secondary | ICD-10-CM | POA: Diagnosis not present

## 2018-01-31 ENCOUNTER — Encounter (HOSPITAL_COMMUNITY): Payer: Medicare HMO

## 2018-01-31 ENCOUNTER — Ambulatory Visit: Payer: Medicare HMO | Admitting: Family

## 2018-02-21 ENCOUNTER — Ambulatory Visit: Payer: Medicare HMO | Admitting: Family

## 2018-02-21 ENCOUNTER — Encounter (HOSPITAL_COMMUNITY): Payer: Medicare HMO

## 2018-02-24 ENCOUNTER — Telehealth: Payer: Self-pay | Admitting: Neurology

## 2018-02-24 ENCOUNTER — Encounter: Payer: Self-pay | Admitting: Neurology

## 2018-02-24 ENCOUNTER — Ambulatory Visit (INDEPENDENT_AMBULATORY_CARE_PROVIDER_SITE_OTHER): Payer: Medicare HMO | Admitting: Neurology

## 2018-02-24 ENCOUNTER — Ambulatory Visit: Payer: Medicare HMO | Admitting: Endocrinology

## 2018-02-24 VITALS — BP 126/62 | HR 92 | Wt 136.0 lb

## 2018-02-24 DIAGNOSIS — R413 Other amnesia: Secondary | ICD-10-CM

## 2018-02-24 DIAGNOSIS — E538 Deficiency of other specified B group vitamins: Secondary | ICD-10-CM | POA: Diagnosis not present

## 2018-02-24 NOTE — Telephone Encounter (Signed)
lvm for patient to be aware I gave him GI phone number of [phone] and to give them a call if he has not heard from them in the next 2-3 business days.  °

## 2018-02-24 NOTE — Progress Notes (Signed)
Reason for visit: Memory disturbance  Referring physician: [name] is a 77 y.o. male  History of present illness:  Steven Sanders is a 77 year old right-handed white male with a history of type 1 diabetes since age 39.  The patient has been a smoker in the past, he quit 15 years ago.  The patient has recently been told by his brother-in-law that he had some issues with memory, in retrospect he says that he has had some problems with repeating himself.  He only realized this within the last 2 or 3 weeks.  He is not exactly sure when the memory issue began.  He does report that in the past he has had episodes of significant hypoglycemia in the evening hours, he may wake up in the morning and realized something had happened overnight.  He now has a continuous monitoring system, this will alarm when his blood sugar gets down to 50.  The patient reports that he lives alone, he is able to manage all of his activities of daily living.  He manages his own medications and keeps up with his appointments, he is able to pay his bills and do the finances without difficulty.  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  He claims that he sleeps fairly well usually, he denies any severe fatigue problems, he may take a nap during the day.  He denies any significant numbness or weakness of extremities or troubles with balance, he does report some fecal incontinence he denies difficulty controlling the bladder.  He denies a family history of memory problems.  He comes to this office for further evaluation.  Past Medical History:  Diagnosis Date  . COPD (chronic obstructive pulmonary disease) ([place])    stopped smoking 5 years ago  . Diabetes mellitus   . GERD (gastroesophageal reflux disease)   . Hypercholesterolemia   . Hypertension   . Peripheral vascular disease [facility])     Past Surgical History:  Procedure Laterality Date  . AORTA - BILATERAL FEMORAL ARTERY BYPASS GRAFT N/A 08/11/2013   Procedure: AORTA BIFEMORAL BYPASS GRAFT;  Surgeon: Serafina Mitchell, MD;  Location: [place];  Service: Vascular;  Laterality: N/A;  . COLONOSCOPY    . COLONOSCOPY W/ BIOPSIES AND POLYPECTOMY  2008  . HERNIA REPAIR      Family History  Problem Relation Age of Onset  . Hyperlipidemia Mother   . Cancer Mother   . Hyperlipidemia Father   . Cancer Father   . Diabetes Neg Hx     Social history:  reports that he quit smoking about 9 years ago. His smoking use included cigarettes. He has a 50.00 pack-year smoking history. He has never used smokeless tobacco. He reports current alcohol use of about 9.0 standard drinks of alcohol per week. He reports that he does not use drugs.  Medications:  Prior to Admission medications   Medication Sig Start Date End Date Taking? Authorizing Provider  ACCU-CHEK AVIVA PLUS test strip CHECK BLOOD SUGAR FOUR TIMES DAILY 07/21/17  Yes Renato Shin, MD  aspirin 81 MG tablet Take 81 mg by mouth daily. Reported on 04/22/2015   Yes [provider]  Blood Glucose Monitoring Suppl (ACCU-CHEK AVIVA PLUS) w/Device KIT USE AS DIRECTED 05/15/17  Yes Renato Shin, MD  cilostazol (PLETAL) 100 MG tablet TAKE 1 TABLET TWICE DAILY BEFORE MEALS 05/18/17  Yes Waynetta Sandy, MD  insulin NPH-regular Human (NOVOLIN 70/30) (70-30) 100 UNIT/ML injection Inject 30 Units into the  skin daily with breakfast. 12/23/16  Yes Renato Shin, MD  Insulin Syringe-Needle U-100 (INSULIN SYRINGE .5CC/30GX5/16") 30G X 5/16" 0.5 ML MISC Use to inject insulin 2 times per day. DISPENSE THE 8 MM SIZE 02/10/16  Yes Renato Shin, MD  lisinopril (PRINIVIL,ZESTRIL) 5 MG tablet Take 5 mg by mouth daily.   Yes [provider]  pantoprazole (PROTONIX) 40 MG tablet  01/22/16  Yes [provider]  rosuvastatin (CRESTOR) 40 MG tablet Take 40 mg by mouth daily.   Yes [provider]      Allergies  Allergen Reactions  . Neomycin Other (See Comments)    unknown  . Tea  Tree Oil Other (See Comments)    unknown  . Simvastatin   . Sulfa Antibiotics Other (See Comments)    unknown  . Other Rash    Chlorhexidine gluconate    ROS:  Out of a complete 14 system review of symptoms, the patient complains only of the following symptoms, and all other reviewed systems are negative.  Memory loss  Blood pressure 126/62, pulse 92, weight 136 lb (61.7 kg).  Physical Exam  General: The patient is alert and cooperative at the time of the examination.  Eyes: Pupils are equal, round, and reactive to light. Discs are flat bilaterally.  Neck: The neck is supple, no carotid bruits are noted.  Respiratory: The respiratory examination is clear.  Cardiovascular: The cardiovascular examination reveals a regular rate and rhythm, no obvious murmurs or rubs are noted.  Skin: Extremities are without significant edema.  Neurologic Exam  Mental status: The patient is alert and oriented x 3 at the time of the examination. The patient has apparent normal recent and remote memory, with an apparently normal attention span and concentration ability.  Cranial nerves: Facial symmetry is present. There is good sensation of the face to pinprick and soft touch bilaterally. The strength of the facial muscles and the muscles to head turning and shoulder shrug are normal bilaterally. Speech is well enunciated, no aphasia or dysarthria is noted. Extraocular movements are full. Visual fields are full. The tongue is midline, and the patient has symmetric elevation of the soft palate. No obvious hearing deficits are noted.  Motor: The motor testing reveals 5 over 5 strength of all 4 extremities. Good symmetric motor tone is noted throughout.  Sensory: Sensory testing is intact to pinprick, soft touch, vibration sensation, and position sense on the upper extremities.  With the lower extremities and may be a pinprick sensory deficit up to the knees bilaterally, there is mild impairment of  vibration sensation in both feet, position sense is essentially normal.  No evidence of extinction is noted.  Coordination: Cerebellar testing reveals good finger-nose-finger and heel-to-shin bilaterally.  Gait and station: Gait is normal. Tandem gait is slightly unsteady. Romberg is negative. No drift is seen.  Reflexes: Deep tendon reflexes are symmetric, but are depressed at the ankles bilaterally. Toes are downgoing bilaterally.   Assessment/Plan:  1.  Mild cognitive impairment  The patient has reported some mild changes in memory, he does have type 1 diabetes with a history of episodes of hypoglycemia in the past that could over time impair memory.  The patient has risk factors for cerebrovascular disease, he will be sent for MRI of the brain.  Blood work will be done today.  We may consider a medication for memory in the future.  He will follow-up in 6 months.  Steven Alexanders MD 02/24/2018 10:19 AM  [facility] [address] [place], [place] [address]  Phone [phone] Fax [phone]

## 2018-02-24 NOTE — Telephone Encounter (Signed)
Francine GravenHumana Berkley Harveyauth: [id number] (exp. 02/24/18 to 03/26/18) order sent to GI. They will reach out to the pt to schedule.

## 2018-02-25 LAB — SEDIMENTATION RATE: SED RATE: 2 mm/h (ref 0–30)

## 2018-02-25 LAB — RPR: RPR: NONREACTIVE

## 2018-02-25 LAB — VITAMIN B12: VITAMIN B 12: 854 pg/mL (ref 232–1245)

## 2018-02-28 ENCOUNTER — Encounter: Payer: Self-pay | Admitting: Family

## 2018-02-28 ENCOUNTER — Ambulatory Visit: Payer: Medicare HMO | Admitting: Family

## 2018-02-28 ENCOUNTER — Ambulatory Visit (HOSPITAL_COMMUNITY)
Admission: RE | Admit: 2018-02-28 | Discharge: 2018-02-28 | Disposition: A | Discharge status: Home / Self Care | Payer: Medicare HMO | Source: Ambulatory Visit | Attending: Family | Admitting: Family

## 2018-02-28 VITALS — BP 149/64 | HR 68 | Temp 96.6°F | Resp 12 | Ht 66.0 in | Wt 135.0 lb

## 2018-02-28 DIAGNOSIS — E1051 Type 1 diabetes mellitus with diabetic peripheral angiopathy without gangrene: Secondary | ICD-10-CM

## 2018-02-28 DIAGNOSIS — I7409 Other arterial embolism and thrombosis of abdominal aorta: Secondary | ICD-10-CM

## 2018-02-28 DIAGNOSIS — I739 Peripheral vascular disease, unspecified: Secondary | ICD-10-CM | POA: Insufficient documentation

## 2018-02-28 DIAGNOSIS — Z95828 Presence of other vascular implants and grafts: Secondary | ICD-10-CM

## 2018-02-28 DIAGNOSIS — Z87891 Personal history of nicotine dependence: Secondary | ICD-10-CM

## 2018-02-28 DIAGNOSIS — R0989 Other specified symptoms and signs involving the circulatory and respiratory systems: Secondary | ICD-10-CM | POA: Diagnosis not present

## 2018-02-28 DIAGNOSIS — I779 Disorder of arteries and arterioles, unspecified: Secondary | ICD-10-CM

## 2018-02-28 NOTE — Patient Instructions (Addendum)
Before your next abdominal ultrasound:  Avoid gas forming foods and beverages the day before the test.   Take two Extra-Strength Gas-X capsules at bedtime the night before the test. Take another two Extra-Strength Gas-X capsules in the middle of the night if you get up to the restroom, if not, first thing in the morning with water.  Do not chew gum.      Peripheral Vascular Disease Peripheral vascular disease (PVD) is a disease of the blood vessels that are not part of your heart and brain. A simple term for PVD is poor circulation. In most cases, PVD narrows the blood vessels that carry blood from your heart to the rest of your body. This can result in a decreased supply of blood to your arms, legs, and internal organs, like your stomach or kidneys. However, it most often affects a person's lower legs and feet. There are two types of PVD.  Organic PVD. This is the more common type. It is caused by damage to the structure of blood vessels.  Functional PVD. This is caused by conditions that make blood vessels contract and tighten (spasm).  Without treatment, PVD tends to get worse over time. PVD can also lead to acute ischemic limb. This is when an arm or limb suddenly has trouble getting enough blood. This is a medical emergency. Follow these instructions at home:  Take medicines only as told by your doctor.  Do not use any tobacco products, including cigarettes, chewing tobacco, or electronic cigarettes. If you need help quitting, ask your doctor.  Lose weight if you are overweight, and maintain a healthy weight as told by your doctor.  Eat a diet that is low in fat and cholesterol. If you need help, ask your doctor.  Exercise regularly. Ask your doctor for some good activities for you.  Take good care of your feet. ? Wear comfortable shoes that fit well. ? Check your feet often for any cuts or sores. Contact a doctor if:  You have cramps in your legs while walking.  You have  leg pain when you are at rest.  You have coldness in a leg or foot.  Your skin changes.  You are unable to get or have an erection (erectile dysfunction).  You have cuts or sores on your feet that are not healing. Get help right away if:  Your arm or leg turns cold and blue.  Your arms or legs become red, warm, swollen, painful, or numb.  You have chest pain or trouble breathing.  You suddenly have weakness in your face, arm, or leg.  You become very confused or you cannot speak.  You suddenly have a very bad headache.  You suddenly cannot see. This information is not intended to replace advice given to you by your health care provider. Make sure you discuss any questions you have with your health care provider. Document Released: 05/27/2009 Document Revised: 08/08/2015 Document Reviewed: 08/10/2013 Elsevier Interactive Patient Education  2017 Elsevier Inc.  

## 2018-02-28 NOTE — Progress Notes (Signed)
[facility]   CC: Follow up peripheral artery occlusive disease  History of Present Illness Steven Sanders is a 77 y.o. male who is s/paortobifemoral bypass graft using a 14 x 8 dacron graft on 08/11/2013 by Dr. Trula Slade. This was done for lifestyle limiting claudication. He has a history of lower extremity ulceration which ultimately healed. He has a known right SFA occlusion.   He walks 1-5 miles/day,6-7days/week. He isridinga stationary bike for a mile some days and does resistance exercises 2-3 days/week. He has a treadmill and stationary bike at home.  He denies claudication sx's with walking on flat surface, has mild bilateral calf claudication walking up slight hills, denies non healing wounds in his lower extremities. He resumed cilostizol use about February 2018.  The patient denies any history of TIA or stroke symptoms, specifically the patient denies a history of amaurosis fugax or monocular blindness, unilateral facial drooping, hemiplegia, and denies a history or expressive aphasia.   The patient has nothad back or abdominal pain.  Diabetic: Yes, diagnosed at age 16,6.5A1C on 10-25-17(review of records). He wears a continuous glucose monitor.He denies any peripheral neuropathy sx's. Tobacco use: former smoker, quit in 2010  Pt meds include: Statin :Yes Betablocker: No ASA: Yes Other anticoagulants/antiplatelets:no    Past Medical History:  Diagnosis Date  . COPD (chronic obstructive pulmonary disease) ([place])    stopped smoking 5 years ago  . Diabetes mellitus   . GERD (gastroesophageal reflux disease)   . Hypercholesterolemia   . Hypertension   . Peripheral vascular disease [facility])     Social History Social History   Tobacco Use  . Smoking status: Former Smoker    Packs/day: 1.00    Years: 50.00    Pack years: 50.00    Types: Cigarettes    Last attempt to quit: 01/14/2009    Years since quitting: 9.1  .  Smokeless tobacco: Never Used  Substance Use Topics  . Alcohol use: Yes    Alcohol/week: 9.0 standard drinks    Types: 3 Glasses of wine, 3 Cans of beer, 3 Shots of liquor per week  . Drug use: No    Family History Family History  Problem Relation Age of Onset  . Hyperlipidemia Mother   . Cancer Mother   . Hyperlipidemia Father   . Cancer Father   . Diabetes Neg Hx     Past Surgical History:  Procedure Laterality Date  . AORTA - BILATERAL FEMORAL ARTERY BYPASS GRAFT N/A 08/11/2013   Procedure: AORTA BIFEMORAL BYPASS GRAFT;  Surgeon: Serafina Mitchell, MD;  Location: [place];  Service: Vascular;  Laterality: N/A;  . COLONOSCOPY    . COLONOSCOPY W/ BIOPSIES AND POLYPECTOMY  2008  . HERNIA REPAIR      Allergies  Allergen Reactions  . Neomycin Other (See Comments)    unknown  . Tea Tree Oil Other (See Comments)    unknown  . Simvastatin   . Sulfa Antibiotics Other (See Comments)    unknown  . Other Rash    Chlorhexidine gluconate    Current Outpatient Medications  Medication Sig Dispense Refill  . ACCU-CHEK AVIVA PLUS test strip CHECK BLOOD SUGAR FOUR TIMES DAILY 400 each 2  . aspirin 81 MG tablet Take 81 mg by mouth daily. Reported on 04/22/2015    . Blood Glucose Monitoring Suppl (ACCU-CHEK AVIVA PLUS) w/Device KIT USE AS DIRECTED 1 kit 0  . cilostazol (PLETAL) 100 MG tablet TAKE 1 TABLET TWICE DAILY BEFORE  MEALS 180 tablet 3  . insulin NPH-regular Human (NOVOLIN 70/30) (70-30) 100 UNIT/ML injection Inject 30 Units into the skin daily with breakfast. 10 mL 11  . Insulin Syringe-Needle U-100 (INSULIN SYRINGE .5CC/30GX5/16") 30G X 5/16" 0.5 ML MISC Use to inject insulin 2 times per day. DISPENSE THE 8 MM SIZE 200 each 2  . lisinopril (PRINIVIL,ZESTRIL) 5 MG tablet Take 5 mg by mouth daily.    . pantoprazole (PROTONIX) 40 MG tablet     . rosuvastatin (CRESTOR) 40 MG tablet Take 40 mg by mouth daily.     No current facility-administered medications for this visit.     ROS:  See HPI for pertinent positives and negatives.   Physical Examination  Vitals:   02/28/18 0957  BP: (!) 149/64  Pulse: 68  Resp: 12  Temp: (!) 96.6 F (35.9 C)  SpO2: 98%  Weight: 135 lb (61.2 kg)  Height: '5\' 6"'$  (1.676 m)   Body mass index is 21.79 kg/m.  General: WDWN male in NAD Gait: Normal HENT: WNL Eyes: PERRLA Pulmonary: normal non-labored breathing, no rales, rhonchi, or wheezing Cardiac: RRR, no murmur detected Abdomen: soft, NT, no masses palpated. CGM device in place [place]. Skin: no rashes, no ulcers, no cellulitis.   VASCULAR EXAM  Carotid Bruits Right Left   positive negative   Abdominal aortic pulse is not palpable  Radial pulses are 2+ palpable and =  VASCULAR EXAM: Extremitieswithoutischemic changes, withoutGangrene; no open wounds.   LE Pulses Right Left  FEMORAL 3+ palpable 3+ palpable   POPLITEAL not palpable  2+ palpable  POSTERIOR TIBIAL not palpable  notpalpable   DORSALIS PEDIS ANTERIOR TIBIAL not palpable  2+palpable     Musculoskeletal: no muscle wasting or atrophy.      Neurologic: A&O X 3; appropriate affect, Sensation is normal; MOTOR FUNCTION:  moving all extremities equally, motor strength 5/5 throughout. Speech is fluent/normal. CN 2-12 intact. Psychiatric: Thought content is normal, mood appropriate for clinical situation.      ASSESSMENT: Steven Sanders is a 77 y.o. male who who is s/paortobifemoral bypass graft using a 14 x 8 dacryon graft on 08/11/2013. This was done for lifestyle limiting claudication. He has a history of lower extremity ulceration which ultimately healed. He has a known right SFA occlusion.   He walks a mile in 20 minutes, 2-3 days/week. He rides a bike for a mile and does resistance exercises 2-3 days/week. He has no claudication sx's with walking, no signs of ischemia in his LE's. ABI's and bilateral LE arterial  duplex today indicate that pt has developed extensive collateral revascularization in both legs by his intensive walking program.  He has no hx of stroke or TIA.   His atherosclerotic risk factors include controlled Type 1 DM and former smoker.    DATA  Aortoiliac Duplex(05/10/17): Patent aortobifemoral bypass graft with no evidence of restenosis. All biphasic waveforms.    Bilateral LE Arterial Duplex (05/10/17): Occlusion noted in the right SFA. Significant collateral vessels distal to right SFA with all biphasic waveforms distal to occluded right SFA. Significant plaque and collateral vessel throughout the left SFA. All biphasic waveforms in the left LE except monophasic at left DFA with velocity of 212 cm/s.  Stable in the right LE, improved in the left LE compared to the exam on 10-13-16.    ABI Findings (02-28-18):  +---------+------------------+-----+----------+--------+  Right  Rt Pressure (mmHg)IndexWaveform Comment   +---------+------------------+-----+----------+--------+  Brachial 128                      +---------+------------------+-----+----------+--------+  PTA   99        0.76 biphasic       +---------+------------------+-----+----------+--------+  DP    100        0.76 monophasicbrisk    +---------+------------------+-----+----------+--------+  Great Toe59        0.45 Abnormal       +---------+------------------+-----+----------+--------+    +---------+------------------+-----+----------+-------+  Left   Lt Pressure (mmHg)IndexWaveform Comment  +---------+------------------+-----+----------+-------+  Brachial 131                      +---------+------------------+-----+----------+-------+  PTA   90        0.69 biphasic       +---------+------------------+-----+----------+-------+  DP    77         0.59 monophasic      +---------+------------------+-----+----------+-------+  Great Toe44        0.34 Abnormal       +---------+------------------+-----+----------+-------+    +-------+-----------+-----------+------------+------------+  ABI/TBIToday's ABIToday's TBIPrevious ABIPrevious TBI  +-------+-----------+-----------+------------+------------+  Right 0.76    0.45    0.76    0.26      +-------+-----------+-----------+------------+------------+  Left  0.69    0.34    0.74    0.31      +-------+-----------+-----------+------------+------------+    Summary:  Right: Resting right ankle-brachial index indicates moderate right lower extremity arterial disease. The right toe-brachial index is abnormal.    Left: Resting left ankle-brachial index indicates moderate left lower extremity arterial disease. The left toe-brachial index is abnormal.    Stable bilateral ABI with moderate disease, bi and monophasic waveforms bilaterally. Compared to 05-10-17.     Carotid duplex1-29-18: <40% bilateral ICA stenosis. Bilateral vertebral artery flow is antegrade.  Bilateral subclavian artery waveforms are normal.  No significant change compared to the last exam on 04-03-15.    PLAN:   Continue fairly intensive exercise regimen.   Based on today's exam and non-invasive vascular lab results, the patient will follow up in 1 year with the following tests: ABI's, bilateral LE arterial duplex,bilateral aortoiliac duplex, and carotid duplex.  I advised the pt to notify our office if his claudication worsens or he develops non healing wounds.     I discussed in depth with the patient the nature of atherosclerosis, and emphasized the importance of maximal medical management including strict control of blood pressure, blood glucose, and lipid levels, obtaining regular exercise, and continued cessation of smoking.   The patient is aware that without maximal medical management the underlying atherosclerotic disease process will progress, limiting the benefit of any interventions.  The patient was given information about PAD including signs, symptoms, treatment, what symptoms should prompt the patient to seek immediate medical care, and risk reduction measures to take.  Clemon Chambers, RN, MSN, FNP-C [facility] Phone: [phone]  Clinic MD: Carlis Abbott on call  02/28/18 10:33 AM

## 2018-03-02 ENCOUNTER — Ambulatory Visit
Admission: RE | Admit: 2018-03-02 | Discharge: 2018-03-02 | Disposition: A | Discharge status: Home / Self Care | Payer: Medicare HMO | Source: Ambulatory Visit | Attending: Neurology | Admitting: Neurology

## 2018-03-02 DIAGNOSIS — R413 Other amnesia: Secondary | ICD-10-CM

## 2018-03-03 ENCOUNTER — Telehealth: Payer: Self-pay

## 2018-03-03 NOTE — Telephone Encounter (Signed)
-----   Message from York Spanielharles K Willis, MD sent at 03/02/2018  4:55 PM EST -----  The blood work results are unremarkable. Please call the patient. ----- Message ----- From: Nell RangeInterface, [facility] Lab Results In Sent: 03/02/2018   4:46 PM EST To: York Spanielharles K Willis, MD

## 2018-03-03 NOTE — Telephone Encounter (Signed)
I contacted the patient and advised of lab results. He voiced understanding and requested a copy of his labs be sent via mail to him. Confirmed address and copy placed in the outgoing mail. MB RN

## 2018-03-04 ENCOUNTER — Telehealth: Payer: Self-pay | Admitting: Neurology

## 2018-03-04 NOTE — Telephone Encounter (Signed)
I called the patient.  MRI of the brain shows a moderate level small vessel disease, he also has significant atrophy of the brain globally and in the mesial temporal regions, this may be consistent with an Alzheimer's process.  The patient is on aspirin.  If he desires to go on a medication for memory, he is to contact our office.   MRI brain 02/02/18:  IMPRESSION: This MRI of the brain without contrast shows the following: 1.   Moderate generalized cortical atrophy that is more severe in the mesial temporal lobes. 2.   Moderate chronic microvascular ischemic changes in the hemispheres and a remote lacunar infarction in the pons. 3.   There are no acute findings.

## 2018-03-28 DIAGNOSIS — E1065 Type 1 diabetes mellitus with hyperglycemia: Secondary | ICD-10-CM | POA: Diagnosis not present

## 2018-03-28 DIAGNOSIS — E1051 Type 1 diabetes mellitus with diabetic peripheral angiopathy without gangrene: Secondary | ICD-10-CM | POA: Diagnosis not present

## 2018-03-30 ENCOUNTER — Ambulatory Visit: Payer: Medicare HMO | Admitting: Endocrinology

## 2018-04-20 ENCOUNTER — Telehealth: Payer: Self-pay | Admitting: Endocrinology

## 2018-04-20 ENCOUNTER — Encounter: Payer: Self-pay | Admitting: Endocrinology

## 2018-04-20 ENCOUNTER — Ambulatory Visit (INDEPENDENT_AMBULATORY_CARE_PROVIDER_SITE_OTHER): Payer: Medicare HMO | Admitting: Endocrinology

## 2018-04-20 VITALS — BP 142/60 | HR 66 | Ht 66.0 in | Wt 139.0 lb

## 2018-04-20 DIAGNOSIS — E1051 Type 1 diabetes mellitus with diabetic peripheral angiopathy without gangrene: Secondary | ICD-10-CM | POA: Diagnosis not present

## 2018-04-20 DIAGNOSIS — M79674 Pain in right toe(s): Secondary | ICD-10-CM | POA: Diagnosis not present

## 2018-04-20 LAB — POCT GLYCOSYLATED HEMOGLOBIN (HGB A1C): Hemoglobin A1C: 7.1 % — AB (ref 4.0–5.6)

## 2018-04-20 MED ORDER — INSULIN REGULAR HUMAN 100 UNIT/ML IJ SOLN: INTRAMUSCULAR | 11 refills | Status: DC

## 2018-04-20 NOTE — Telephone Encounter (Signed)
FYI patient refused to schedule 4 month follow up and requested 6 months.

## 2018-04-20 NOTE — Patient Instructions (Addendum)
check your blood sugar 4 times a day: before the 3 meals, and at bedtime.  also check if you have symptoms of your blood sugar being too high or too low.  please keep a record of the readings and bring it to your next appointment here.  You can write it on any piece of paper.  please call us sooner if your blood sugar goes below 70, or if you have a lot of readings over 200.   Please avoid the regular insulin, as your a1c is too low.   On this type of insulin schedule, you should eat meals on a regular schedule.  If a meal is missed or significantly delayed, your blood sugar could go low.   Please continue the 70/30 insulin, 30 units with breakfast.  Please limit the R insulin to 1 unit at a time, for any blood sugar over 200.   Please see a foot specialist.  you will receive a phone call, about a day and time for an appointment Please come back for a follow-up appointment in 4 months.

## 2018-04-20 NOTE — Telephone Encounter (Signed)
Message left on machine to call me to set up an appointment to discuss insulin pumps.  My Telephone number give with times I am here

## 2018-04-20 NOTE — Telephone Encounter (Signed)
Patient has inquired about starting an insulin pump, if you are able to advise on this. Patient did not inform Dr Everardo AllEllison or Ammie during appointment.  Please Advise, thanks

## 2018-04-20 NOTE — Telephone Encounter (Signed)
FYI

## 2018-04-20 NOTE — Progress Notes (Signed)
Subjective:    Patient ID: Steven Sanders, male    DOB: 04/08/1940, 78 y.o.   MRN: [id number]  HPI Pt returns for f/u of diabetes mellitus: DM type: 1 Dx'ed: 5456 Complications: polyneuropathy, nephropathy, retinopathy, and PAD.  Therapy: insulin since dx.   DKA: never. Severe hypoglycemia: multiple episodes, most recently in 2015.   Pancreatitis: never.  Other: he has declined multiple daily injections; he has a dexcom-5 continuous glucose monitor.   Interval history: He still takes 70/30, 30 units qam  He still takes prn reg insulin (averages approx 3 total units/day).  We reviewed continuous glucose monitor information together.  It varies from  50-200.  It is in general lowest at lunch.  There is otherwise no trend throughout the day.  pt states he feels well in general.  Past Medical History:  Diagnosis Date  . COPD (chronic obstructive pulmonary disease) ([place])    stopped smoking 5 years ago  . Diabetes mellitus   . GERD (gastroesophageal reflux disease)   . Hypercholesterolemia   . Hypertension   . Peripheral vascular disease [facility])     Past Surgical History:  Procedure Laterality Date  . AORTA - BILATERAL FEMORAL ARTERY BYPASS GRAFT N/A 08/11/2013   Procedure: AORTA BIFEMORAL BYPASS GRAFT;  Surgeon: Serafina Mitchell, MD;  Location: [facility];  Service: Vascular;  Laterality: N/A;  . COLONOSCOPY    . COLONOSCOPY W/ BIOPSIES AND POLYPECTOMY  2008  . HERNIA REPAIR      Social History   Socioeconomic History  . Marital status: Widowed    Spouse name: Not on file  . Number of children: Not on file  . Years of education: Not on file  . Highest education level: Not on file  Occupational History  . Occupation: Retired  Scientific laboratory technician  . Financial resource strain: Not on file  . Food insecurity:    Worry: Not on file    Inability: Not on file  . Transportation needs:    Medical: Not on file    Non-medical: Not on file  Tobacco Use  . Smoking status: Former Smoker   Packs/day: 1.00    Years: 50.00    Pack years: 50.00    Types: Cigarettes    Last attempt to quit: 01/14/2009    Years since quitting: 9.2  . Smokeless tobacco: Never Used  Substance and Sexual Activity  . Alcohol use: Yes    Alcohol/week: 9.0 standard drinks    Types: 3 Glasses of wine, 3 Cans of beer, 3 Shots of liquor per week  . Drug use: No  . Sexual activity: Not on file  Lifestyle  . Physical activity:    Days per week: Not on file    Minutes per session: Not on file  . Stress: Not on file  Relationships  . Social connections:    Talks on phone: Not on file    Gets together: Not on file    Attends religious service: Not on file    Active member of club or organization: Not on file    Attends meetings of clubs or organizations: Not on file    Relationship status: Not on file  . Intimate partner violence:    Fear of current or ex partner: Not on file    Emotionally abused: Not on file    Physically abused: Not on file    Forced sexual activity: Not on file  Other Topics Concern  . Not on file  Social History Narrative  Right Handed    Lives at home alone   Caffeine 5 cups daily    Current Outpatient Medications on File Prior to Visit  Medication Sig Dispense Refill  . ACCU-CHEK AVIVA PLUS test strip CHECK BLOOD SUGAR FOUR TIMES DAILY 400 each 2  . aspirin 81 MG tablet Take 81 mg by mouth daily. Reported on 04/22/2015    . Blood Glucose Monitoring Suppl (ACCU-CHEK AVIVA PLUS) w/Device KIT USE AS DIRECTED 1 kit 0  . cilostazol (PLETAL) 100 MG tablet TAKE 1 TABLET TWICE DAILY BEFORE MEALS 180 tablet 3  . insulin NPH-regular Human (NOVOLIN 70/30) (70-30) 100 UNIT/ML injection Inject 30 Units into the skin daily with breakfast. 10 mL 11  . Insulin Syringe-Needle U-100 (INSULIN SYRINGE .5CC/30GX5/16") 30G X 5/16" 0.5 ML MISC Use to inject insulin 2 times per day. DISPENSE THE 8 MM SIZE 200 each 2  . lisinopril (PRINIVIL,ZESTRIL) 5 MG tablet Take 5 mg by mouth daily.    .  pantoprazole (PROTONIX) 40 MG tablet     . rosuvastatin (CRESTOR) 40 MG tablet Take 40 mg by mouth daily.     No current facility-administered medications on file prior to visit.     Allergies  Allergen Reactions  . Neomycin Other (See Comments)    unknown  . Tea Tree Oil Other (See Comments)    unknown  . Simvastatin   . Sulfa Antibiotics Other (See Comments)    unknown  . Other Rash    Chlorhexidine gluconate    Family History  Problem Relation Age of Onset  . Hyperlipidemia Mother   . Cancer Mother   . Hyperlipidemia Father   . Cancer Father   . Diabetes Neg Hx     BP (!) 142/60 (BP Location: Left Arm, Patient Position: Sitting, Cuff Size: Normal)   Pulse 66   Ht '5\' 6"'$  (1.676 m)   Wt 139 lb (63 kg)   SpO2 97%   BMI 22.44 kg/m    Review of Systems He has right toe pain--no injury.  Denies LOC.      Objective:   Physical Exam VITAL SIGNS:  See vs page GENERAL: no distress Pulses: dorsalis pedis intact bilat.   MSK: no deformity of the feet CV: trace bilat leg edema Skin:  no ulcer on the feet.  normal color and temp on the feet. Neuro: sensation is intact to touch on the feet Ext: There is bilateral onychomycosis of the toenails.  No other abnormality of the right great toe is seen   Lab Results  Component Value Date   HGBA1C 7.1 (A) 04/20/2018        Assessment & Plan:  Type 1 DM, with PAD: overcontrolled. Hypoglycemia: prob due to SS-reg insulin Foot pain, new, uncertain etiology   Patient Instructions  check your blood sugar 4 times a day: before the 3 meals, and at bedtime.  also check if you have symptoms of your blood sugar being too high or too low.  please keep a record of the readings and bring it to your next appointment here.  You can write it on any piece of paper.  please call us sooner if your blood sugar goes below 70, or if you have a lot of readings over 200.   Please avoid the regular insulin, as your a1c is too low.   On this type  of insulin schedule, you should eat meals on a regular schedule.  If a meal is missed or significantly delayed, your blood  sugar could go low.   Please continue the 70/30 insulin, 30 units with breakfast.  Please limit the R insulin to 1 unit at a time, for any blood sugar over 200.   Please see a foot specialist.  you will receive a phone call, about a day and time for an appointment Please come back for a follow-up appointment in 4 months.

## 2018-04-22 ENCOUNTER — Telehealth: Payer: Self-pay | Admitting: Endocrinology

## 2018-04-22 NOTE — Telephone Encounter (Signed)
Patient is requesting a call back from [facility] about a pump. Please Advise, thanks

## 2018-04-22 NOTE — Telephone Encounter (Signed)
Per [facility] "Caller states, was seen 2 days ago for foot issues. He is waiting for a specialist appt."

## 2018-04-25 NOTE — Telephone Encounter (Signed)
noted 

## 2018-04-26 DIAGNOSIS — M2041 Other hammer toe(s) (acquired), right foot: Secondary | ICD-10-CM | POA: Diagnosis not present

## 2018-04-26 DIAGNOSIS — E1351 Other specified diabetes mellitus with diabetic peripheral angiopathy without gangrene: Secondary | ICD-10-CM | POA: Diagnosis not present

## 2018-04-26 DIAGNOSIS — M205X1 Other deformities of toe(s) (acquired), right foot: Secondary | ICD-10-CM | POA: Diagnosis not present

## 2018-04-26 DIAGNOSIS — L84 Corns and callosities: Secondary | ICD-10-CM | POA: Diagnosis not present

## 2018-04-26 NOTE — Telephone Encounter (Signed)
Message left on voicemail to call me to set up an appointment for pump discussion

## 2018-04-27 DIAGNOSIS — H40013 Open angle with borderline findings, low risk, bilateral: Secondary | ICD-10-CM | POA: Diagnosis not present

## 2018-04-27 DIAGNOSIS — H01003 Unspecified blepharitis right eye, unspecified eyelid: Secondary | ICD-10-CM | POA: Diagnosis not present

## 2018-04-27 DIAGNOSIS — H40033 Anatomical narrow angle, bilateral: Secondary | ICD-10-CM | POA: Diagnosis not present

## 2018-04-27 NOTE — Telephone Encounter (Signed)
Appointment scheduled for next Tuesday

## 2018-05-05 ENCOUNTER — Ambulatory Visit: Payer: Medicare HMO | Admitting: Neurology

## 2018-05-09 ENCOUNTER — Other Ambulatory Visit: Payer: Self-pay | Admitting: Endocrinology

## 2018-05-10 ENCOUNTER — Encounter: Payer: Medicare HMO | Attending: Endocrinology | Admitting: Nutrition

## 2018-05-10 DIAGNOSIS — E1051 Type 1 diabetes mellitus with diabetic peripheral angiopathy without gangrene: Secondary | ICD-10-CM | POA: Insufficient documentation

## 2018-05-10 NOTE — Patient Instructions (Signed)
Call if you have questions. Call if/when you get the pump, for an appointment for training

## 2018-05-10 NOTE — Progress Notes (Signed)
We discussed how insulin pumps work, and he was shown the 3 models of insulin pumps.  He is on the [facility] G5 and was told that he can update for free to the G-6 when he is in need of a new sensor.   We discussed the advantages and disadvantages of pump therapy, and the advantages and disadvantages of each model.  He is very much interested in cost, and because he is wearing the Dexcom, he filled out paperwork for cost of the Tandem and OmniPod.  He was given brochures on each model, and told to go on the websites to review more information there.  If he has questions, he was told to call the rep.(whose name is on the brochures), or to call me.  He had no final questions. Paperwork was filled out for Tandem and OmniPod for cost only, and faxed to respective companies.

## 2018-06-14 DIAGNOSIS — E1065 Type 1 diabetes mellitus with hyperglycemia: Secondary | ICD-10-CM | POA: Diagnosis not present

## 2018-06-14 DIAGNOSIS — E1051 Type 1 diabetes mellitus with diabetic peripheral angiopathy without gangrene: Secondary | ICD-10-CM | POA: Diagnosis not present

## 2018-06-21 ENCOUNTER — Ambulatory Visit: Payer: Self-pay | Admitting: Neurology

## 2018-06-22 ENCOUNTER — Other Ambulatory Visit: Payer: Self-pay | Admitting: Vascular Surgery

## 2018-06-22 DIAGNOSIS — R0989 Other specified symptoms and signs involving the circulatory and respiratory systems: Secondary | ICD-10-CM

## 2018-06-22 DIAGNOSIS — Z95828 Presence of other vascular implants and grafts: Secondary | ICD-10-CM

## 2018-06-22 DIAGNOSIS — Z87891 Personal history of nicotine dependence: Secondary | ICD-10-CM

## 2018-06-22 DIAGNOSIS — Z8679 Personal history of other diseases of the circulatory system: Secondary | ICD-10-CM

## 2018-08-04 DIAGNOSIS — G629 Polyneuropathy, unspecified: Secondary | ICD-10-CM | POA: Diagnosis not present

## 2018-08-04 DIAGNOSIS — E1129 Type 2 diabetes mellitus with other diabetic kidney complication: Secondary | ICD-10-CM | POA: Diagnosis not present

## 2018-08-04 DIAGNOSIS — E1149 Type 2 diabetes mellitus with other diabetic neurological complication: Secondary | ICD-10-CM | POA: Diagnosis not present

## 2018-08-04 DIAGNOSIS — E1040 Type 1 diabetes mellitus with diabetic neuropathy, unspecified: Secondary | ICD-10-CM | POA: Diagnosis not present

## 2018-08-04 DIAGNOSIS — E1051 Type 1 diabetes mellitus with diabetic peripheral angiopathy without gangrene: Secondary | ICD-10-CM | POA: Diagnosis not present

## 2018-08-04 DIAGNOSIS — G3184 Mild cognitive impairment, so stated: Secondary | ICD-10-CM | POA: Diagnosis not present

## 2018-08-04 DIAGNOSIS — E10319 Type 1 diabetes mellitus with unspecified diabetic retinopathy without macular edema: Secondary | ICD-10-CM | POA: Diagnosis not present

## 2018-08-04 DIAGNOSIS — I700 Atherosclerosis of aorta: Secondary | ICD-10-CM | POA: Diagnosis not present

## 2018-08-04 DIAGNOSIS — E1139 Type 2 diabetes mellitus with other diabetic ophthalmic complication: Secondary | ICD-10-CM | POA: Diagnosis not present

## 2018-08-04 DIAGNOSIS — Z0000 Encounter for general adult medical examination without abnormal findings: Secondary | ICD-10-CM | POA: Diagnosis not present

## 2018-08-04 DIAGNOSIS — Z1389 Encounter for screening for other disorder: Secondary | ICD-10-CM | POA: Diagnosis not present

## 2018-08-04 DIAGNOSIS — I739 Peripheral vascular disease, unspecified: Secondary | ICD-10-CM | POA: Diagnosis not present

## 2018-08-04 DIAGNOSIS — E13319 Other specified diabetes mellitus with unspecified diabetic retinopathy without macular edema: Secondary | ICD-10-CM | POA: Diagnosis not present

## 2018-08-04 DIAGNOSIS — E1029 Type 1 diabetes mellitus with other diabetic kidney complication: Secondary | ICD-10-CM | POA: Diagnosis not present

## 2018-08-25 DIAGNOSIS — H35033 Hypertensive retinopathy, bilateral: Secondary | ICD-10-CM | POA: Diagnosis not present

## 2018-08-25 DIAGNOSIS — H2513 Age-related nuclear cataract, bilateral: Secondary | ICD-10-CM | POA: Diagnosis not present

## 2018-08-25 DIAGNOSIS — E103293 Type 1 diabetes mellitus with mild nonproliferative diabetic retinopathy without macular edema, bilateral: Secondary | ICD-10-CM | POA: Diagnosis not present

## 2018-08-25 DIAGNOSIS — H40013 Open angle with borderline findings, low risk, bilateral: Secondary | ICD-10-CM | POA: Diagnosis not present

## 2018-08-26 ENCOUNTER — Ambulatory Visit: Payer: Medicare HMO | Admitting: Neurology

## 2018-09-06 ENCOUNTER — Other Ambulatory Visit: Payer: Self-pay

## 2018-09-06 ENCOUNTER — Ambulatory Visit (INDEPENDENT_AMBULATORY_CARE_PROVIDER_SITE_OTHER): Payer: Medicare HMO | Admitting: Neurology

## 2018-09-06 ENCOUNTER — Telehealth: Payer: Self-pay

## 2018-09-06 ENCOUNTER — Encounter: Payer: Self-pay | Admitting: Neurology

## 2018-09-06 DIAGNOSIS — R413 Other amnesia: Secondary | ICD-10-CM | POA: Diagnosis not present

## 2018-09-06 HISTORY — DX: Other amnesia: R41.3

## 2018-09-06 MED ORDER — DONEPEZIL HCL 5 MG PO TABS: 5.0000 mg | ORAL_TABLET | Freq: Every day | ORAL | 1 refills | Status: DC

## 2018-09-06 NOTE — Telephone Encounter (Signed)
I reached out to the pt and confirmed 09/06/18 in office visit.

## 2018-09-06 NOTE — Patient Instructions (Signed)
We will start Aricept for the memory.   Begin Aricept (donepezil) at 5 mg at night for one month. If this medication is well-tolerated, please call our office and we will call in a prescription for the 10 mg tablets. Look out for side effects that may include nausea, diarrhea, weight loss, or stomach cramps. This medication will also cause a runny nose, therefore there is no need for allergy medications for this purpose.  

## 2018-09-06 NOTE — Progress Notes (Signed)
Reason for visit: Memory disturbance  Steven Sanders is an 78 y.o. male  History of present illness:  Steven Sanders is a 78 year old right-handed white male with a history of memory disturbance.  The patient is living alone, he is able to operate a motor vehicle, keep up with his medications and appointments, and do the finances.  He has not given up any activities of living because of memory.  He has been noted to repeat himself frequently.  He has undergone MRI of the brain, this showed a moderate level of small vessel disease, he does have history of type 1 diabetes.  He has had episodes of hypoglycemia at night in the past, but now he has an alarm system when his blood sugar goes too low.  The patient has significant cortical atrophy and mesial temporal atrophy consistent with an Alzheimer's process.  He returns for an evaluation.  Past Medical History:  Diagnosis Date   COPD (chronic obstructive pulmonary disease) ([place])    stopped smoking 5 years ago   Diabetes mellitus    GERD (gastroesophageal reflux disease)    Hypercholesterolemia    Hypertension    Peripheral vascular disease ([facility])     Past Surgical History:  Procedure Laterality Date   AORTA - BILATERAL FEMORAL ARTERY BYPASS GRAFT N/A 08/11/2013   Procedure: AORTA BIFEMORAL BYPASS GRAFT;  Surgeon: Serafina Mitchell, MD;  [facility];  Service: Vascular;  Laterality: N/A;   COLONOSCOPY     COLONOSCOPY W/ BIOPSIES AND POLYPECTOMY  2008   HERNIA REPAIR      Family History  Problem Relation Age of Onset   Hyperlipidemia Mother    Cancer Mother    Hyperlipidemia Father    Cancer Father    Diabetes Neg Hx     Social history:  reports that he quit smoking about 9 years ago. His smoking use included cigarettes. He has a 50.00 pack-year smoking history. He has never used smokeless tobacco. He reports current alcohol use of about 9.0 standard drinks of alcohol per week. He reports that he does not use  drugs.    Allergies  Allergen Reactions   Neomycin Other (See Comments)    unknown   Tea Tree Oil Other (See Comments)    unknown   Simvastatin    Sulfa Antibiotics Other (See Comments)    unknown   Other Rash    Chlorhexidine gluconate    Medications:  Prior to Admission medications   Medication Sig Start Date End Date Taking? Authorizing Provider  ACCU-CHEK AVIVA PLUS test strip CHECK BLOOD SUGAR FOUR TIMES DAILY 07/21/17  Yes Renato Shin, MD  aspirin 81 MG tablet Take 81 mg by mouth daily. Reported on 04/22/2015   Yes [provider]  Blood Glucose Monitoring Suppl (ACCU-CHEK AVIVA PLUS) w/Device KIT USE AS DIRECTED 05/15/17  Yes Renato Shin, MD  cilostazol (PLETAL) 100 MG tablet TAKE 1 TABLET TWICE DAILY BEFORE MEALS 06/22/18  Yes Serafina Mitchell, MD  DROPLET INSULIN SYRINGE 31G X 5/16" 0.5 ML MISC USE TO INJECT INSULIN 2 TIMES PER DAY 05/09/18  Yes Renato Shin, MD  insulin NPH-regular Human (NOVOLIN 70/30) (70-30) 100 UNIT/ML injection Inject 30 Units into the skin daily with breakfast. 12/23/16  Yes Renato Shin, MD  insulin regular (NOVOLIN R,HUMULIN R) 100 units/mL injection 1 unit for a blood sugar over 200. 04/20/18  Yes Renato Shin, MD  Insulin Syringe-Needle U-100 (INSULIN SYRINGE .5CC/30GX5/16") 30G X 5/16" 0.5 ML MISC Use to  inject insulin 2 times per day. DISPENSE THE 8 MM SIZE 02/10/16  Yes Renato Shin, MD  lisinopril (PRINIVIL,ZESTRIL) 5 MG tablet Take 5 mg by mouth daily.   Yes [provider]  pantoprazole (PROTONIX) 40 MG tablet  01/22/16  Yes [provider]  rosuvastatin (CRESTOR) 40 MG tablet Take 40 mg by mouth daily.   Yes [provider]    ROS:  Out of a complete 14 system review of symptoms, the patient complains only of the following symptoms, and all other reviewed systems are negative.  Memory disturbance  Blood pressure (!) 136/52, pulse 71, temperature 98.3 F (36.8 C), temperature source Oral, height 5'  6" (1.676 m), weight 137 lb 5 oz (62.3 kg), SpO2 97 %.  Physical Exam  General: The patient is alert and cooperative at the time of the examination.  Skin: No significant peripheral edema is noted.   Neurologic Exam  Mental status: The patient is alert and oriented x 3 at the time of the examination. The Mini-Mental status examination done today shows a total score 25/30.   Cranial nerves: Facial symmetry is present. Speech is normal, no aphasia or dysarthria is noted. Extraocular movements are full. Visual fields are full.  Motor: The patient has good strength in all 4 extremities.  Sensory examination: Soft touch sensation is symmetric on the face, arms, and legs.  Coordination: The patient has good finger-nose-finger and heel-to-shin bilaterally.  Gait and station: The patient has a normal gait. Tandem gait is slightly unsteady. Romberg is negative. No drift is seen.  Reflexes: Deep tendon reflexes are symmetric.   MRI brain 02/02/18:  IMPRESSION: This MRI of the brain without contrast shows the following: 1. Moderate generalized cortical atrophy that is more severe in the mesial temporal lobes. 2. Moderate chronic microvascular ischemic changes in the hemispheres and a remote lacunar infarction in the pons. 3. There are no acute findings.  * MRI scan images were reviewed online. I agree with the written report.    Assessment/Plan:  1.  Memory disturbance  2.  Type 1 diabetes  3.  Small vessel disease by MRI brain  The patient is to remain on low-dose aspirin.  We will add Aricept for memory, if he tolerates the 5 mg nightly dosing, he will call for a 10 mg maintenance therapy.  The patient will otherwise follow-up in 6 months.  Jill Alexanders MD 09/06/2018 9:47 AM  [facility] [address] [place], [place] [address]  Phone [phone] Fax [phone]

## 2018-10-13 ENCOUNTER — Other Ambulatory Visit: Payer: Self-pay

## 2018-10-13 DIAGNOSIS — E1051 Type 1 diabetes mellitus with diabetic peripheral angiopathy without gangrene: Secondary | ICD-10-CM

## 2018-10-13 MED ORDER — ACCU-CHEK AVIVA VI SOLN: 1.0000 | 0 refills | Status: AC | PRN

## 2018-10-13 MED ORDER — ACCU-CHEK SOFTCLIX LANCETS MISC: 1.0000 | Freq: Four times a day (QID) | 3 refills | Status: AC

## 2018-10-13 MED ORDER — ACCU-CHEK AVIVA PLUS VI STRP: 1.0000 | ORAL_STRIP | Freq: Four times a day (QID) | 3 refills | Status: AC

## 2018-10-13 MED ORDER — BD SWAB SINGLE USE REGULAR PADS: 1.0000 | MEDICATED_PAD | Freq: Four times a day (QID) | 3 refills | Status: AC

## 2018-10-13 MED ORDER — ACCU-CHEK AVIVA PLUS W/DEVICE KIT: 1.0000 | PACK | Freq: Four times a day (QID) | 0 refills | Status: DC

## 2018-10-31 ENCOUNTER — Other Ambulatory Visit: Payer: Self-pay | Admitting: Neurology

## 2018-10-31 ENCOUNTER — Other Ambulatory Visit: Payer: Self-pay

## 2018-11-02 ENCOUNTER — Encounter: Payer: Self-pay | Admitting: Endocrinology

## 2018-11-02 ENCOUNTER — Ambulatory Visit (INDEPENDENT_AMBULATORY_CARE_PROVIDER_SITE_OTHER): Payer: Medicare HMO | Admitting: Endocrinology

## 2018-11-02 ENCOUNTER — Other Ambulatory Visit: Payer: Self-pay

## 2018-11-02 VITALS — BP 162/70 | HR 72 | Ht 66.0 in | Wt 136.0 lb

## 2018-11-02 DIAGNOSIS — E1051 Type 1 diabetes mellitus with diabetic peripheral angiopathy without gangrene: Secondary | ICD-10-CM | POA: Diagnosis not present

## 2018-11-02 LAB — POCT GLYCOSYLATED HEMOGLOBIN (HGB A1C): Hemoglobin A1C: 6.7 % — AB (ref 4.0–5.6)

## 2018-11-02 MED ORDER — INSULIN REGULAR HUMAN 100 UNIT/ML IJ SOLN: INTRAMUSCULAR | 11 refills | Status: DC

## 2018-11-02 NOTE — Progress Notes (Signed)
Subjective:    Patient ID: Steven Sanders, male    DOB: 04/22/1940, 78 y.o.   MRN: [id number]  HPI Pt returns for f/u of diabetes mellitus: DM type: 1 Dx'ed: 4098 Complications: polyneuropathy, nephropathy, retinopathy, and PAD.  Therapy: insulin since dx.   DKA: never. Severe hypoglycemia: multiple episodes, most recently in 2015.   Pancreatitis: never.  Other: he has declined multiple daily injections; he has a dexcom-5 continuous glucose monitor.   Interval history: He still takes 70/30, 30 units qam  He still takes prn reg insulin (averages approx 4-5 total units/day).  We reviewed continuous glucose monitor information together.  It varies from  50-250.  It is in general higher as the day goes on.  pt states he feels well in general.  Past Medical History:  Diagnosis Date  . COPD (chronic obstructive pulmonary disease) ([place])    stopped smoking 5 years ago  . Diabetes mellitus   . GERD (gastroesophageal reflux disease)   . Hypercholesterolemia   . Hypertension   . Memory difficulty 09/06/2018  . Peripheral vascular disease [facility])     Past Surgical History:  Procedure Laterality Date  . AORTA - BILATERAL FEMORAL ARTERY BYPASS GRAFT N/A 08/11/2013   Procedure: AORTA BIFEMORAL BYPASS GRAFT;  Surgeon: Serafina Mitchell, MD;  Location: [place];  Service: Vascular;  Laterality: N/A;  . COLONOSCOPY    . COLONOSCOPY W/ BIOPSIES AND POLYPECTOMY  2008  . HERNIA REPAIR      Social History   Socioeconomic History  . Marital status: Widowed    Spouse name: Not on file  . Number of children: Not on file  . Years of education: Not on file  . Highest education level: Not on file  Occupational History  . Occupation: Retired  Scientific laboratory technician  . Financial resource strain: Not on file  . Food insecurity    Worry: Not on file    Inability: Not on file  . Transportation needs    Medical: Not on file    Non-medical: Not on file  Tobacco Use  . Smoking status: Former Smoker   Packs/day: 1.00    Years: 50.00    Pack years: 50.00    Types: Cigarettes    Quit date: 01/14/2009    Years since quitting: 9.8  . Smokeless tobacco: Never Used  Substance and Sexual Activity  . Alcohol use: Yes    Alcohol/week: 9.0 standard drinks    Types: 3 Glasses of wine, 3 Cans of beer, 3 Shots of liquor per week  . Drug use: No  . Sexual activity: Not on file  Lifestyle  . Physical activity    Days per week: Not on file    Minutes per session: Not on file  . Stress: Not on file  Relationships  . Social Herbalist on phone: Not on file    Gets together: Not on file    Attends religious service: Not on file    Active member of club or organization: Not on file    Attends meetings of clubs or organizations: Not on file    Relationship status: Not on file  . Intimate partner violence    Fear of current or ex partner: Not on file    Emotionally abused: Not on file    Physically abused: Not on file    Forced sexual activity: Not on file  Other Topics Concern  . Not on file  Social History Narrative   Right  Handed    Lives at home alone   Caffeine 5 cups daily    Current Outpatient Medications on File Prior to Visit  Medication Sig Dispense Refill  . Accu-Chek Softclix Lancets lancets 1 each by Other route 4 (four) times daily. Use to monitor glucose levels four times daily; E10.51 100 each 3  . Alcohol Swabs (B-D SINGLE USE SWABS REGULAR) PADS 1 each by Does not apply route 4 (four) times daily. Use to cleanse site prior to obtaining droplet for glucose monitoring; E10.51 100 each 3  . aspirin 81 MG tablet Take 81 mg by mouth daily. Reported on 04/22/2015    . Blood Glucose Calibration (ACCU-CHEK AVIVA) SOLN 1 each by In Vitro route as needed. Use to calibrate meter prn; E10.51 1 each 0  . Blood Glucose Monitoring Suppl (ACCU-CHEK AVIVA PLUS) w/Device KIT 1 each by Other route 4 (four) times daily. Use to monitor glucose levels four times daily; E10.51 1 kit 0  .  cilostazol (PLETAL) 100 MG tablet TAKE 1 TABLET TWICE DAILY BEFORE MEALS 180 tablet 3  . donepezil (ARICEPT) 5 MG tablet TAKE 1 TABLET BY MOUTH EVERYDAY AT BEDTIME 30 tablet 1  . DROPLET INSULIN SYRINGE 31G X 5/16" 0.5 ML MISC USE TO INJECT INSULIN 2 TIMES PER DAY 200 each 2  . glucose blood (ACCU-CHEK AVIVA PLUS) test strip 1 each by Other route 4 (four) times daily. Use to monitor glucose levels four times daily; E10.51 100 each 3  . insulin NPH-regular Human (NOVOLIN 70/30) (70-30) 100 UNIT/ML injection Inject 30 Units into the skin daily with breakfast. 10 mL 11  . Insulin Syringe-Needle U-100 (INSULIN SYRINGE .5CC/30GX5/16") 30G X 5/16" 0.5 ML MISC Use to inject insulin 2 times per day. DISPENSE THE 8 MM SIZE 200 each 2  . lisinopril (PRINIVIL,ZESTRIL) 5 MG tablet Take 5 mg by mouth daily.    . pantoprazole (PROTONIX) 40 MG tablet     . rosuvastatin (CRESTOR) 40 MG tablet Take 40 mg by mouth daily.     No current facility-administered medications on file prior to visit.     Allergies  Allergen Reactions  . Neomycin Other (See Comments)    unknown  . Tea Tree Oil Other (See Comments)    unknown  . Simvastatin   . Sulfa Antibiotics Other (See Comments)    unknown  . Other Rash    Chlorhexidine gluconate    Family History  Problem Relation Age of Onset  . Hyperlipidemia Mother   . Cancer Mother   . Hyperlipidemia Father   . Cancer Father   . Diabetes Neg Hx     BP (!) 162/70 (BP Location: Left Arm, Patient Position: Sitting, Cuff Size: Normal)   Pulse 72   Ht _0  (1.676 m)   Wt 136 lb (61.7 kg)   SpO2 (!) 89%   BMI 21.95 kg/m    Review of Systems Denies LOC    Objective:   Physical Exam VITAL SIGNS:  See vs page GENERAL: no distress Pulses: dorsalis pedis intact bilat.   MSK: no deformity of the feet CV: no leg edema Skin:  no ulcer on the feet.  normal color and temp on the feet. Neuro: sensation is intact to touch on the feet.   A1c=6.7%  Lab Results   Component Value Date   CREATININE 0.55 08/16/2013   BUN 14 08/16/2013   NA 148 (H) 08/16/2013   K 3.1 (L) 08/16/2013   CL 111 08/16/2013  CO2 25 08/16/2013      Assessment & Plan:  Type 1 DM, with PAD: overcontrolled.  Hypoglycemia: this limits aggressiveness of glycemic control. Noncompliance with insulin dosing: we discussed need to limit reg insulin  Patient Instructions  check your blood sugar 4 times a day: before the 3 meals, and at bedtime.  also check if you have symptoms of your blood sugar being too high or too low.  please keep a record of the readings and bring it to your next appointment here.  You can write it on any piece of paper.  please call us sooner if your blood sugar goes below 70, or if you have a lot of readings over 200.   On this type of insulin schedule, you should eat meals on a regular schedule.  If a meal is missed or significantly delayed, your blood sugar could go low.   Please continue the 70/30 insulin, 30 units with breakfast.  Please limit the R insulin to 1 unit total per day (and only take for a sugar over 200).   Please come back for a follow-up appointment in 4 months.

## 2018-11-02 NOTE — Patient Instructions (Addendum)
check your blood sugar 4 times a day: before the 3 meals, and at bedtime.  also check if you have symptoms of your blood sugar being too high or too low.  please keep a record of the readings and bring it to your next appointment here.  You can write it on any piece of paper.  please call us sooner if your blood sugar goes below 70, or if you have a lot of readings over 200.   On this type of insulin schedule, you should eat meals on a regular schedule.  If a meal is missed or significantly delayed, your blood sugar could go low.   Please continue the 70/30 insulin, 30 units with breakfast.  Please limit the R insulin to 1 unit total per day (and only take for a sugar over 200).   Please come back for a follow-up appointment in 4 months.

## 2018-11-04 ENCOUNTER — Telehealth: Payer: Self-pay

## 2018-11-04 NOTE — Telephone Encounter (Signed)
Chart notes for Dexcom CGM faxed with confirmation.

## 2018-11-16 DIAGNOSIS — E1051 Type 1 diabetes mellitus with diabetic peripheral angiopathy without gangrene: Secondary | ICD-10-CM | POA: Diagnosis not present

## 2018-11-16 DIAGNOSIS — E1065 Type 1 diabetes mellitus with hyperglycemia: Secondary | ICD-10-CM | POA: Diagnosis not present

## 2018-11-28 DIAGNOSIS — Z1159 Encounter for screening for other viral diseases: Secondary | ICD-10-CM | POA: Diagnosis not present

## 2018-12-01 DIAGNOSIS — Z86010 Personal history of colonic polyps: Secondary | ICD-10-CM | POA: Diagnosis not present

## 2018-12-19 DIAGNOSIS — E1065 Type 1 diabetes mellitus with hyperglycemia: Secondary | ICD-10-CM | POA: Diagnosis not present

## 2018-12-19 DIAGNOSIS — E1051 Type 1 diabetes mellitus with diabetic peripheral angiopathy without gangrene: Secondary | ICD-10-CM | POA: Diagnosis not present

## 2018-12-30 ENCOUNTER — Telehealth: Payer: Self-pay

## 2018-12-30 NOTE — Telephone Encounter (Signed)
Company: Dexcom  Document: [facility] Other records requested: None requested  All above requested information has been faxed successfully to [employer] listed above. Documents and fax confirmation have been placed in the faxed file for future reference.

## 2019-01-19 DIAGNOSIS — E1065 Type 1 diabetes mellitus with hyperglycemia: Secondary | ICD-10-CM | POA: Diagnosis not present

## 2019-01-19 DIAGNOSIS — E1051 Type 1 diabetes mellitus with diabetic peripheral angiopathy without gangrene: Secondary | ICD-10-CM | POA: Diagnosis not present

## 2019-02-01 ENCOUNTER — Other Ambulatory Visit: Payer: Self-pay

## 2019-02-01 DIAGNOSIS — E1051 Type 1 diabetes mellitus with diabetic peripheral angiopathy without gangrene: Secondary | ICD-10-CM

## 2019-02-01 MED ORDER — "BD SAFETYGLIDE INSULIN SYRINGE 31G X 15/64"" 0.5 ML MISC": 1.0000 | Freq: Two times a day (BID) | 2 refills | Status: DC

## 2019-02-13 ENCOUNTER — Other Ambulatory Visit: Payer: Self-pay

## 2019-02-13 DIAGNOSIS — E1051 Type 1 diabetes mellitus with diabetic peripheral angiopathy without gangrene: Secondary | ICD-10-CM

## 2019-02-13 MED ORDER — "BD SAFETYGLIDE INSULIN SYRINGE 31G X 15/64"" 0.5 ML MISC": 1.0000 | Freq: Two times a day (BID) | 2 refills | Status: DC

## 2019-02-21 DIAGNOSIS — K219 Gastro-esophageal reflux disease without esophagitis: Secondary | ICD-10-CM | POA: Diagnosis not present

## 2019-02-21 DIAGNOSIS — E1149 Type 2 diabetes mellitus with other diabetic neurological complication: Secondary | ICD-10-CM | POA: Diagnosis not present

## 2019-02-21 DIAGNOSIS — E1129 Type 2 diabetes mellitus with other diabetic kidney complication: Secondary | ICD-10-CM | POA: Diagnosis not present

## 2019-02-21 DIAGNOSIS — E1139 Type 2 diabetes mellitus with other diabetic ophthalmic complication: Secondary | ICD-10-CM | POA: Diagnosis not present

## 2019-02-21 DIAGNOSIS — E13319 Other specified diabetes mellitus with unspecified diabetic retinopathy without macular edema: Secondary | ICD-10-CM | POA: Diagnosis not present

## 2019-02-21 DIAGNOSIS — I739 Peripheral vascular disease, unspecified: Secondary | ICD-10-CM | POA: Diagnosis not present

## 2019-02-21 DIAGNOSIS — R809 Proteinuria, unspecified: Secondary | ICD-10-CM | POA: Diagnosis not present

## 2019-02-21 DIAGNOSIS — G629 Polyneuropathy, unspecified: Secondary | ICD-10-CM | POA: Diagnosis not present

## 2019-02-21 DIAGNOSIS — E7800 Pure hypercholesterolemia, unspecified: Secondary | ICD-10-CM | POA: Diagnosis not present

## 2019-02-27 DIAGNOSIS — E1051 Type 1 diabetes mellitus with diabetic peripheral angiopathy without gangrene: Secondary | ICD-10-CM | POA: Diagnosis not present

## 2019-02-27 DIAGNOSIS — E1065 Type 1 diabetes mellitus with hyperglycemia: Secondary | ICD-10-CM | POA: Diagnosis not present

## 2019-02-28 ENCOUNTER — Telehealth: Payer: Self-pay | Admitting: Nutrition

## 2019-02-28 NOTE — Telephone Encounter (Signed)
Please start with: basal rate of 0.4units/hr bolus of 1 unit/30 grams carbohydrate correction bolus (which some people call "sensitivity," or "insulin sensitivity ratio," or just "isr") of 1 unit for each by 100 which your glucose exceeds 100

## 2019-02-28 NOTE — Telephone Encounter (Signed)
Appointment made for tomorrow for his OmniPod pump start

## 2019-03-01 ENCOUNTER — Telehealth: Payer: Self-pay | Admitting: Nutrition

## 2019-03-01 ENCOUNTER — Encounter: Payer: Medicare HMO | Attending: Family Medicine | Admitting: Nutrition

## 2019-03-01 ENCOUNTER — Other Ambulatory Visit: Payer: Self-pay

## 2019-03-01 DIAGNOSIS — E1051 Type 1 diabetes mellitus with diabetic peripheral angiopathy without gangrene: Secondary | ICD-10-CM | POA: Insufficient documentation

## 2019-03-01 NOTE — Telephone Encounter (Signed)
Ok, therefore please start with: basal rate of 0.4units/hr bolus of5 units  twice a day (just before each meal) correction bolus (which some people call "sensitivity," or "insulin sensitivity ratio," or just "isr") of 1 unit for each by 100 which your glucose exceeds 100 F/u OV fri 12/18, at 8:15 AM

## 2019-03-01 NOTE — Telephone Encounter (Signed)
Patient is very confused today.  We reviewed how to give a bolus, but if he continues to call up here with questions about what he needs to do, he needs to be told: "1.  Remove the pod from your arm and go back on injections, until you can see Vaughan Basta again". Patient has been told this, but my forget.

## 2019-03-01 NOTE — Telephone Encounter (Signed)
Please give me a meal time dose of insulin.  He does not count carbs.  He takes 30u of 70/30 in the AM.  He only 2 meals:  15 carbs for first meal and 30 carbs for 2nd meal.

## 2019-03-03 ENCOUNTER — Ambulatory Visit (INDEPENDENT_AMBULATORY_CARE_PROVIDER_SITE_OTHER): Payer: Medicare HMO | Admitting: Endocrinology

## 2019-03-03 ENCOUNTER — Encounter: Payer: Self-pay | Admitting: Endocrinology

## 2019-03-03 ENCOUNTER — Telehealth: Payer: Self-pay

## 2019-03-03 VITALS — BP 140/62 | HR 60 | Ht 66.0 in | Wt 138.2 lb

## 2019-03-03 DIAGNOSIS — E1051 Type 1 diabetes mellitus with diabetic peripheral angiopathy without gangrene: Secondary | ICD-10-CM

## 2019-03-03 LAB — POCT GLYCOSYLATED HEMOGLOBIN (HGB A1C): Hemoglobin A1C: 6.6 % — AB (ref 4.0–5.6)

## 2019-03-03 NOTE — Progress Notes (Signed)
Subjective:    Patient ID: Steven Sanders, male    DOB: 12/12/1940, 78 y.o.   MRN: [id number]  HPI Pt returns for f/u of diabetes mellitus: DM type: 1 Dx'ed: 2683 Complications: polyneuropathy, nephropathy, retinopathy, and PAD.  Therapy: insulin since dx.   DKA: never. Severe hypoglycemia: multiple episodes, most recently in 2015.   Pancreatitis: never.  Other: he has declined multiple daily injections; he has a dexcom-5 continuous glucose monitor.   Interval history: he stopped using pump.  He requests to stay off.  pt states he feels well in general.  He seldom has hypoglycemia, and these episodes are mild.  This happens with exercise.  Past Medical History:  Diagnosis Date  . COPD (chronic obstructive pulmonary disease) ([place])    stopped smoking 5 years ago  . Diabetes mellitus   . GERD (gastroesophageal reflux disease)   . Hypercholesterolemia   . Hypertension   . Memory difficulty 09/06/2018  . Peripheral vascular disease [facility])     Past Surgical History:  Procedure Laterality Date  . AORTA - BILATERAL FEMORAL ARTERY BYPASS GRAFT N/A 08/11/2013   Procedure: AORTA BIFEMORAL BYPASS GRAFT;  Surgeon: Serafina Mitchell, MD;  Location: [place];  Service: Vascular;  Laterality: N/A;  . COLONOSCOPY    . COLONOSCOPY W/ BIOPSIES AND POLYPECTOMY  2008  . HERNIA REPAIR      Social History   Socioeconomic History  . Marital status: Widowed    Spouse name: Not on file  . Number of children: Not on file  . Years of education: Not on file  . Highest education level: Not on file  Occupational History  . Occupation: Retired  Tobacco Use  . Smoking status: Former Smoker    Packs/day: 1.00    Years: 50.00    Pack years: 50.00    Types: Cigarettes    Quit date: 01/14/2009    Years since quitting: 10.1  . Smokeless tobacco: Never Used  Substance and Sexual Activity  . Alcohol use: Yes    Alcohol/week: 9.0 standard drinks    Types: 3 Glasses of wine, 3 Cans of beer, 3 Shots of  liquor per week  . Drug use: No  . Sexual activity: Not on file  Other Topics Concern  . Not on file  Social History Narrative   Right Handed    Lives at home alone   Caffeine 5 cups daily   Social Determinants of Health   Financial Resource Strain:   . Difficulty of Paying Living Expenses: Not on file  Food Insecurity:   . Worried About Charity fundraiser in the Last Year: Not on file  . Ran Out of Food in the Last Year: Not on file  Transportation Needs:   . Lack of Transportation (Medical): Not on file  . Lack of Transportation (Non-Medical): Not on file  Physical Activity:   . Days of Exercise per Week: Not on file  . Minutes of Exercise per Session: Not on file  Stress:   . Feeling of Stress : Not on file  Social Connections:   . Frequency of Communication with Friends and Family: Not on file  . Frequency of Social Gatherings with Friends and Family: Not on file  . Attends Religious Services: Not on file  . Active Member of Clubs or Organizations: Not on file  . Attends Archivist Meetings: Not on file  . Marital Status: Not on file  Intimate Partner Violence:   . Fear of  Current or Ex-Partner: Not on file  . Emotionally Abused: Not on file  . Physically Abused: Not on file  . Sexually Abused: Not on file    Current Outpatient Medications on File Prior to Visit  Medication Sig Dispense Refill  . Accu-Chek Softclix Lancets lancets 1 each by Other route 4 (four) times daily. Use to monitor glucose levels four times daily; E10.51 100 each 3  . Alcohol Swabs (B-D SINGLE USE SWABS REGULAR) PADS 1 each by Does not apply route 4 (four) times daily. Use to cleanse site prior to obtaining droplet for glucose monitoring; E10.51 100 each 3  . aspirin 81 MG tablet Take 81 mg by mouth daily. Reported on 04/22/2015    . Blood Glucose Calibration (ACCU-CHEK AVIVA) SOLN 1 each by In Vitro route as needed. Use to calibrate meter prn; E10.51 1 each 0  . Blood Glucose  Monitoring Suppl (ACCU-CHEK AVIVA PLUS) w/Device KIT 1 each by Other route 4 (four) times daily. Use to monitor glucose levels four times daily; E10.51 1 kit 0  . cilostazol (PLETAL) 100 MG tablet TAKE 1 TABLET TWICE DAILY BEFORE MEALS 180 tablet 3  . donepezil (ARICEPT) 5 MG tablet TAKE 1 TABLET BY MOUTH EVERYDAY AT BEDTIME 30 tablet 1  . DROPLET INSULIN SYRINGE 31G X 5/16" 0.5 ML MISC USE TO INJECT INSULIN 2 TIMES PER DAY 200 each 2  . glucose blood (ACCU-CHEK AVIVA PLUS) test strip 1 each by Other route 4 (four) times daily. Use to monitor glucose levels four times daily; E10.51 100 each 3  . insulin NPH-regular Human (NOVOLIN 70/30) (70-30) 100 UNIT/ML injection Inject 30 Units into the skin daily with breakfast. 10 mL 11  . insulin regular (NOVOLIN R) 100 units/mL injection 1 unit for a blood sugar over 200. 10 mL 11  . Insulin Syringe-Needle U-100 (BD SAFETYGLIDE INSULIN SYRINGE) 31G X 15/64" 0.5 ML MISC 1 each by Does not apply route 2 (two) times daily. 180 each 2  . lisinopril (PRINIVIL,ZESTRIL) 5 MG tablet Take 5 mg by mouth daily.    . pantoprazole (PROTONIX) 40 MG tablet     . rosuvastatin (CRESTOR) 40 MG tablet Take 40 mg by mouth daily.     No current facility-administered medications on file prior to visit.    Allergies  Allergen Reactions  . Neomycin Other (See Comments)    unknown  . Tea Tree Oil Other (See Comments)    unknown  . Simvastatin   . Sulfa Antibiotics Other (See Comments)    unknown  . Other Rash    Chlorhexidine gluconate    Family History  Problem Relation Age of Onset  . Hyperlipidemia Mother   . Cancer Mother   . Hyperlipidemia Father   . Cancer Father   . Diabetes Neg Hx     BP 140/62 (BP Location: Right Arm, Patient Position: Sitting, Cuff Size: Normal)   Pulse 60   Ht '5\' 6"'$  (1.676 m)   Wt 138 lb 3.2 oz (62.7 kg)   SpO2 98%   BMI 22.31 kg/m    Review of Systems Denies LOC.      Objective:   Physical Exam VITAL SIGNS:  See vs  page GENERAL: no distress Pulses: dorsalis pedis intact bilat.   MSK: no deformity of the feet CV: no leg edema Skin:  no ulcer on the feet.  normal color and temp on the feet. Neuro: sensation is intact to touch on the feet     Assessment &  Plan:  Type 1 DM, with PAD: he has had difficulty operating pump, so he requests to stop.   Hypoglycemia: this limits aggressiveness of glycemic control  Patient Instructions  Please go off the insulin, and go back to the same insulin, 30 units with breakfast. check your blood sugar twice a day.  vary the time of day when you check, between before the 3 meals, and at bedtime.  also check if you have symptoms of your blood sugar being too high or too low.  please keep a record of the readings and bring it to your next appointment here (or you can bring the meter itself).  You can write it on any piece of paper.  please call us sooner if your blood sugar goes below 70, or if you have a lot of readings over 200. Please come back for a follow-up appointment in 2 months.

## 2019-03-03 NOTE — Patient Instructions (Addendum)
Please go off the insulin, and go back to the same insulin, 30 units with breakfast. check your blood sugar twice a day.  vary the time of day when you check, between before the 3 meals, and at bedtime.  also check if you have symptoms of your blood sugar being too high or too low.  please keep a record of the readings and bring it to your next appointment here (or you can bring the meter itself).  You can write it on any piece of paper.  please call us sooner if your blood sugar goes below 70, or if you have a lot of readings over 200. Please come back for a follow-up appointment in 2 months.

## 2019-03-03 NOTE — Telephone Encounter (Signed)
Pt presents for appt today with Dr. Loanne Drilling. During appt, pt has chosen to discontinue use of pump and CGM. [facility] request CMN and orders be completed. However, in light of pt choice to self d/c pump and CGM, documents were NOT completed but Dr. Loanne Drilling did write "PATIENT DECLINES" on forms. These forms were returned to [place] to allow them to close his case and to discontinue requests for Dr. Loanne Drilling to complete forms. Confirmation received.

## 2019-03-07 ENCOUNTER — Telehealth: Payer: Self-pay | Admitting: Nutrition

## 2019-03-07 NOTE — Telephone Encounter (Signed)
Patient reported that he gave his boluses as directed without any difficulty yesterday.  He said that his blood sugar dropped during the night to 38, and he was told to call Dr. Cordelia Pen office right away.  He said that he would wait until tomorrow with is appointment with him.  I tried to talk him into calling the office, explaining that he may drop again tonight, if we do not change the settings.  He reported that he would think about it.  I strongly encouraged him to do this again, and he said he would.

## 2019-03-07 NOTE — Progress Notes (Signed)
Patient was very confused, saying he did not talk to me on the phone yesterday.  We discussed how to put the battery into the PDM, but he was not able to find the battery.  The battery was located on top of the PDM.  He was shown this and how to put the battery into the back of the PDM.   We discussed how this pump will deliver the insulin, and how to fill a pod, attach it and give a bolus.  Written instructions were given for how to bolus, as well as the resource manual was marked for the pages with steps to give a bolus and fill a pod.  He made his own notes on these pages as well, to help him.  His blood sugar was 267, and he gave himself a correction dose with help from me.  Settings were put into the pump per Dr. Cordelia Pen orders:  Basal rate: 0.4, ISF100, with correction over 100, and since he does not count carbs, he was given a 1u/1 gram of carb for his meal time insulin--putting in units where the carbs are, He was told to put in "5 carbs" equalling 5u of insulin before his two meals.  He wrote this in his book under the directions for giving a bolus.  We reviewed again how to give a bolus, putting in 5 for the carbs ans adding his blood sugar.  After 6 times, he reported that he remembered how to do this.   We discussed the need to test blood sugar before each meal and at bedtime and to call the office if blood sugars remained high or dropped below 70.  He agreed to do this.

## 2019-03-07 NOTE — Telephone Encounter (Signed)
Patient reported that he could not find his pump, and that he left it in my office.  I told him that it was in the bag that he brought to my office, and he reported that he had no bag with him at his visit with me.  I told him to check his car, and he reported that he had found it.

## 2019-03-07 NOTE — Patient Instructions (Signed)
Test blood sugars before meals and at bedtime Stop all insulin injections. Read over resource manual and the pump manual.

## 2019-03-21 ENCOUNTER — Ambulatory Visit: Payer: Medicare HMO | Admitting: Neurology

## 2019-03-30 NOTE — Telephone Encounter (Signed)
Duplicate report

## 2019-04-19 ENCOUNTER — Telehealth: Payer: Self-pay | Admitting: Endocrinology

## 2019-04-19 NOTE — Telephone Encounter (Signed)
From 03/03/19 entry:  Pt presents for appt today with Dr. Everardo All. During appt, pt has chosen to discontinue use of pump and CGM. [facility] request CMN and orders be completed. However, in light of pt choice to self d/c pump and CGM, documents were NOT completed but Dr. Everardo All did write "PATIENT DECLINES" on forms. These forms were returned to [facility] to allow them to close his case and to discontinue requests for Dr. Everardo All to complete forms. Confirmation received.  After review of your note, in addition to above mentioned, pt was advised during appt to resume insulin at 30 units at breakfast and to check CBG's BID. This automatically disqualifies him from either device. It was also my understanding from Cristy Folks, Diabetic Nurse Educator, pt has memory issues and is NOT an ideal candidate for an insulin pump. Please advise

## 2019-04-19 NOTE — Telephone Encounter (Signed)
If pt declines sooner appt, please keep the one on 05/02/19

## 2019-04-19 NOTE — Telephone Encounter (Signed)
Patient requests to be called at ph# [phone] asap re: Patient is having trouble ordering Dexcom supplies from [facility]. [facility] told patient they have been in touch with Dr. Everardo All but have not received anything back from Dr. Everardo All allowing them to supply patient. Patient is out of his Dexcom supplies (sensor and transmitter- as of today). Patient requests our office call [facility] ph# [phone] asap.

## 2019-04-19 NOTE — Telephone Encounter (Signed)
This will be addressed and discussed between pt and Dr. Everardo All at next office visit

## 2019-04-21 ENCOUNTER — Other Ambulatory Visit: Payer: Self-pay

## 2019-04-21 ENCOUNTER — Ambulatory Visit: Payer: Medicare HMO | Admitting: Endocrinology

## 2019-04-21 VITALS — BP 142/68 | HR 105 | Wt 138.4 lb

## 2019-04-21 DIAGNOSIS — E1051 Type 1 diabetes mellitus with diabetic peripheral angiopathy without gangrene: Secondary | ICD-10-CM | POA: Diagnosis not present

## 2019-04-21 LAB — POCT GLYCOSYLATED HEMOGLOBIN (HGB A1C): Hemoglobin A1C: 6.4 % — AB (ref 4.0–5.6)

## 2019-04-21 MED ORDER — DEXCOM G6 TRANSMITTER MISC: 1.0000 | Freq: Once | 3 refills | Status: AC

## 2019-04-21 MED ORDER — DEXCOM G6 SENSOR MISC: 1.0000 | 3 refills | Status: DC

## 2019-04-21 MED ORDER — NOVOLIN 70/30 (70-30) 100 UNIT/ML ~~LOC~~ SUSP: 28.0000 [IU] | Freq: Every day | SUBCUTANEOUS | 11 refills | Status: DC

## 2019-04-21 NOTE — Progress Notes (Signed)
Subjective:    Patient ID: Steven Sanders, male    DOB: [birth year], 79 y.o.   MRN: [id number]  HPI Pt returns for f/u of diabetes mellitus: DM type: 1 Dx'ed: 5009 Complications: polyneuropathy, nephropathy, retinopathy, and PAD.  Therapy: insulin since dx.   DKA: never. Severe hypoglycemia: multiple episodes, most recently in 2015.   Pancreatitis: never.  Other: he has declined multiple daily injections; he has a dexcom-G6 continuous glucose monitor; in 2020, he chose to d/c pump Interval history: Pt says he has been asking for continuous glucose monitor supplies, rather than the pump.  He takes 30 units qam.  He says he has mild hypoglycemia approx twice per week.  Past Medical History:  Diagnosis Date  . COPD (chronic obstructive pulmonary disease) ([place])    stopped smoking 5 years ago  . Diabetes mellitus   . GERD (gastroesophageal reflux disease)   . Hypercholesterolemia   . Hypertension   . Memory difficulty 09/06/2018  . Peripheral vascular disease [facility])     Past Surgical History:  Procedure Laterality Date  . AORTA - BILATERAL FEMORAL ARTERY BYPASS GRAFT N/A 08/11/2013   Procedure: AORTA BIFEMORAL BYPASS GRAFT;  Surgeon: Serafina Mitchell, MD;  Location: [place];  Service: Vascular;  Laterality: N/A;  . COLONOSCOPY    . COLONOSCOPY W/ BIOPSIES AND POLYPECTOMY  2008  . HERNIA REPAIR      Social History   Socioeconomic History  . Marital status: Widowed    Spouse name: Not on file  . Number of children: Not on file  . Years of education: Not on file  . Highest education level: Not on file  Occupational History  . Occupation: Retired  Tobacco Use  . Smoking status: Former Smoker    Packs/day: 1.00    Years: 50.00    Pack years: 50.00    Types: Cigarettes    Quit date: 01/14/2009    Years since quitting: 10.2  . Smokeless tobacco: Never Used  Substance and Sexual Activity  . Alcohol use: Yes    Alcohol/week: 9.0 standard drinks    Types: 3 Glasses of wine, 3  Cans of beer, 3 Shots of liquor per week  . Drug use: No  . Sexual activity: Not on file  Other Topics Concern  . Not on file  Social History Narrative   Right Handed    Lives at home alone   Caffeine 5 cups daily   Social Determinants of Health   Financial Resource Strain:   . Difficulty of Paying Living Expenses: Not on file  Food Insecurity:   . Worried About Charity fundraiser in the Last Year: Not on file  . Ran Out of Food in the Last Year: Not on file  Transportation Needs:   . Lack of Transportation (Medical): Not on file  . Lack of Transportation (Non-Medical): Not on file  Physical Activity:   . Days of Exercise per Week: Not on file  . Minutes of Exercise per Session: Not on file  Stress:   . Feeling of Stress : Not on file  Social Connections:   . Frequency of Communication with Friends and Family: Not on file  . Frequency of Social Gatherings with Friends and Family: Not on file  . Attends Religious Services: Not on file  . Active Member of Clubs or Organizations: Not on file  . Attends Archivist Meetings: Not on file  . Marital Status: Not on file  Intimate Partner Violence:   .  Fear of Current or Ex-Partner: Not on file  . Emotionally Abused: Not on file  . Physically Abused: Not on file  . Sexually Abused: Not on file    Current Outpatient Medications on File Prior to Visit  Medication Sig Dispense Refill  . Accu-Chek Softclix Lancets lancets 1 each by Other route 4 (four) times daily. Use to monitor glucose levels four times daily; E10.51 100 each 3  . Alcohol Swabs (B-D SINGLE USE SWABS REGULAR) PADS 1 each by Does not apply route 4 (four) times daily. Use to cleanse site prior to obtaining droplet for glucose monitoring; E10.51 100 each 3  . aspirin 81 MG tablet Take 81 mg by mouth daily. Reported on 04/22/2015    . Blood Glucose Calibration (ACCU-CHEK AVIVA) SOLN 1 each by In Vitro route as needed. Use to calibrate meter prn; E10.51 1 each 0   . Blood Glucose Monitoring Suppl (ACCU-CHEK AVIVA PLUS) w/Device KIT 1 each by Other route 4 (four) times daily. Use to monitor glucose levels four times daily; E10.51 1 kit 0  . cilostazol (PLETAL) 100 MG tablet TAKE 1 TABLET TWICE DAILY BEFORE MEALS 180 tablet 3  . donepezil (ARICEPT) 5 MG tablet TAKE 1 TABLET BY MOUTH EVERYDAY AT BEDTIME 30 tablet 1  . DROPLET INSULIN SYRINGE 31G X 5/16" 0.5 ML MISC USE TO INJECT INSULIN 2 TIMES PER DAY 200 each 2  . glucose blood (ACCU-CHEK AVIVA PLUS) test strip 1 each by Other route 4 (four) times daily. Use to monitor glucose levels four times daily; E10.51 100 each 3  . Insulin Syringe-Needle U-100 (BD SAFETYGLIDE INSULIN SYRINGE) 31G X 15/64" 0.5 ML MISC 1 each by Does not apply route 2 (two) times daily. 180 each 2  . lisinopril (PRINIVIL,ZESTRIL) 5 MG tablet Take 5 mg by mouth daily.    . pantoprazole (PROTONIX) 40 MG tablet     . rosuvastatin (CRESTOR) 40 MG tablet Take 40 mg by mouth daily.     No current facility-administered medications on file prior to visit.    Allergies  Allergen Reactions  . Neomycin Other (See Comments)    unknown  . Tea Tree Oil Other (See Comments)    unknown  . Simvastatin   . Sulfa Antibiotics Other (See Comments)    unknown  . Other Rash    Chlorhexidine gluconate    Family History  Problem Relation Age of Onset  . Hyperlipidemia Mother   . Cancer Mother   . Hyperlipidemia Father   . Cancer Father   . Diabetes Neg Hx     BP (!) 142/68 (BP Location: Left Arm, Patient Position: Sitting, Cuff Size: Normal)   Pulse (!) 105   Wt 138 lb 6.4 oz (62.8 kg)   SpO2 98%   BMI 22.34 kg/m   Review of Systems Denies LOC.     Objective:   Physical Exam VITAL SIGNS:  See vs page GENERAL: no distress Pulses: dorsalis pedis intact bilat.   MSK: no deformity of the feet CV: no leg edema Skin:  no ulcer on the feet.  normal color and temp on the feet. Neuro: sensation is intact to touch on the feet Ext:  there is bilateral onychomycosis of the toenails  Lab Results  Component Value Date   HGBA1C 6.4 (A) 04/21/2019   Lab Results  Component Value Date   CREATININE 0.55 08/16/2013   BUN 14 08/16/2013   NA 148 (H) 08/16/2013   K 3.1 (L) 08/16/2013   CL  111 08/16/2013   CO2 25 08/16/2013       Assessment & Plan:  HTN: is noted today Type 1 DM, with PAD: overcontrolled elderly state: he is not a candidate for aggressive glycemic control.   Patient Instructions  Your blood pressure is high today.  Please see your primary care provider soon, to have it rechecked.   Please reduce the insulin to 28 units each morning.   I have sent a prescription to your pharmacy, for the continuous glucose monitor sensors and a transmitter. Please come back for a follow-up appointment in 2 months.

## 2019-04-21 NOTE — Patient Instructions (Addendum)
Your blood pressure is high today.  Please see your primary care provider soon, to have it rechecked.   Please reduce the insulin to 28 units each morning.   I have sent a prescription to your pharmacy, for the continuous glucose monitor sensors and a transmitter. Please come back for a follow-up appointment in 2 months.

## 2019-04-22 ENCOUNTER — Other Ambulatory Visit: Payer: Self-pay | Admitting: Surgery

## 2019-04-22 DIAGNOSIS — Z95828 Presence of other vascular implants and grafts: Secondary | ICD-10-CM

## 2019-04-22 DIAGNOSIS — Z87891 Personal history of nicotine dependence: Secondary | ICD-10-CM

## 2019-04-22 DIAGNOSIS — R0989 Other specified symptoms and signs involving the circulatory and respiratory systems: Secondary | ICD-10-CM

## 2019-04-22 DIAGNOSIS — Z8679 Personal history of other diseases of the circulatory system: Secondary | ICD-10-CM

## 2019-04-24 ENCOUNTER — Other Ambulatory Visit: Payer: Self-pay

## 2019-04-24 ENCOUNTER — Telehealth: Payer: Self-pay

## 2019-04-24 DIAGNOSIS — E1051 Type 1 diabetes mellitus with diabetic peripheral angiopathy without gangrene: Secondary | ICD-10-CM

## 2019-04-24 MED ORDER — DEXCOM G6 SENSOR MISC: 1.0000 | 3 refills | Status: DC

## 2019-04-24 NOTE — Telephone Encounter (Signed)
Please advise 

## 2019-04-24 NOTE — Telephone Encounter (Signed)
Paperwork for Dexcom sensors filled out and faxed to [facility] with fax confirmation.

## 2019-04-24 NOTE — Telephone Encounter (Signed)
Patient saw Dr. Everardo All on 04/21/19. Patient states Dr. Everardo All sent in information to [facility] for the [facility]. [facility] told the patient they sent Dr. Everardo All forms but Dr. Everardo All has not responded re: [facility] needs mopre information to sell patient Dexcom supplies. Patient is out of the supplies for the Dexcom. Patient's ph# [phone] (patient states to call him if you have any questions).

## 2019-04-24 NOTE — Telephone Encounter (Signed)
Pharmacy  [facility] - [place], [place] - [address] [address] [place]  [address] [address] [place] [address], [place] [address]  Phone:  [phone]  Fax:  [phone]    Outpatient Medication Detail   Disp Refills Start End   Continuous Blood Gluc Sensor (DEXCOM G6 SENSOR) MISC 9 each 3 04/24/2019    Sig - Route: 1 Device by Does not apply route See admin instructions. Change every 10 days - Does not apply   Sent to pharmacy as: Continuous Blood Gluc Sensor (DEXCOM G6 SENSOR) Misc   E-Prescribing Status: Receipt confirmed by pharmacy (04/24/2019 12:58 PM EST)

## 2019-04-24 NOTE — Telephone Encounter (Signed)
I sent rx on 04/21/19.  I don't have the form.

## 2019-04-25 ENCOUNTER — Telehealth: Payer: Self-pay

## 2019-04-25 ENCOUNTER — Ambulatory Visit: Payer: Medicare HMO | Admitting: Neurology

## 2019-04-25 NOTE — Telephone Encounter (Signed)
Company: Ambulance person  Document: CGM order Other records requested: Office notes  All above requested information has been faxed successfully to [employer] listed above. Documents and fax confirmation have been placed in the faxed file for future reference.

## 2019-04-26 DIAGNOSIS — H0102B Squamous blepharitis left eye, upper and lower eyelids: Secondary | ICD-10-CM | POA: Diagnosis not present

## 2019-04-26 DIAGNOSIS — H40033 Anatomical narrow angle, bilateral: Secondary | ICD-10-CM | POA: Diagnosis not present

## 2019-04-26 DIAGNOSIS — H0102A Squamous blepharitis right eye, upper and lower eyelids: Secondary | ICD-10-CM | POA: Diagnosis not present

## 2019-04-26 DIAGNOSIS — H40013 Open angle with borderline findings, low risk, bilateral: Secondary | ICD-10-CM | POA: Diagnosis not present

## 2019-04-26 NOTE — Progress Notes (Deleted)
PATIENT: Steven Sanders DOB: 15-Nov-1940  REASON FOR VISIT: follow up HISTORY FROM: patient  HISTORY OF PRESENT ILLNESS: Today 04/26/19  Steven Sanders is a 79 year old male with history of memory disturbance.  He lives alone, operates a Teacher, music without difficulty.  He manages his own medications, appointments, and finances.  MRI of the brain has shown a moderate level vessel disease.  He does have history of type 1 diabetes.  He has significant cortical atrophy and mesial temporal atrophy consistent with Alzheimer's process.  He remains on Aricept.  He also takes aspirin for small vessel disease.  HISTORY 09/06/2018 Dr. Willis:Steven Sanders is a 79 year old right-handed white male with a history of memory disturbance.  The patient is living alone, he is able to operate a motor vehicle, keep up with his medications and appointments, and do the finances.  He has not given up any activities of living because of memory.  He has been noted to repeat himself frequently.  He has undergone MRI of the brain, this showed a moderate level of small vessel disease, he does have history of type 1 diabetes.  He has had episodes of hypoglycemia at night in the past, but now he has an alarm system when his blood sugar goes too low.  The patient has significant cortical atrophy and mesial temporal atrophy consistent with an Alzheimer's process.  He returns for an evaluation.  REVIEW OF SYSTEMS: Out of a complete 14 system review of symptoms, the patient complains only of the following symptoms, and all other reviewed systems are negative.  ALLERGIES: Allergies  Allergen Reactions  . Neomycin Other (See Comments)    unknown  . Tea Tree Oil Other (See Comments)    unknown  . Simvastatin   . Sulfa Antibiotics Other (See Comments)    unknown  . Other Rash    Chlorhexidine gluconate    HOME MEDICATIONS: Outpatient Medications Prior to Visit  Medication Sig Dispense Refill  . Accu-Chek Softclix Lancets  lancets 1 each by Other route 4 (four) times daily. Use to monitor glucose levels four times daily; E10.51 100 each 3  . Alcohol Swabs (B-D SINGLE USE SWABS REGULAR) PADS 1 each by Does not apply route 4 (four) times daily. Use to cleanse site prior to obtaining droplet for glucose monitoring; E10.51 100 each 3  . aspirin 81 MG tablet Take 81 mg by mouth daily. Reported on 04/22/2015    . Blood Glucose Calibration (ACCU-CHEK AVIVA) SOLN 1 each by In Vitro route as needed. Use to calibrate meter prn; E10.51 1 each 0  . Blood Glucose Monitoring Suppl (ACCU-CHEK AVIVA PLUS) w/Device KIT 1 each by Other route 4 (four) times daily. Use to monitor glucose levels four times daily; E10.51 1 kit 0  . cilostazol (PLETAL) 100 MG tablet TAKE 1 TABLET TWICE DAILY BEFORE MEALS 180 tablet 3  . Continuous Blood Gluc Sensor (DEXCOM G6 SENSOR) MISC 1 Device by Does not apply route See admin instructions. Change every 10 days 9 each 3  . donepezil (ARICEPT) 5 MG tablet TAKE 1 TABLET BY MOUTH EVERYDAY AT BEDTIME 30 tablet 1  . DROPLET INSULIN SYRINGE 31G X 5/16" 0.5 ML MISC USE TO INJECT INSULIN 2 TIMES PER DAY 200 each 2  . glucose blood (ACCU-CHEK AVIVA PLUS) test strip 1 each by Other route 4 (four) times daily. Use to monitor glucose levels four times daily; E10.51 100 each 3  . insulin NPH-regular Human (NOVOLIN 70/30) (70-30) 100 UNIT/ML injection  Inject 28 Units into the skin daily with breakfast. 10 mL 11  . Insulin Syringe-Needle U-100 (BD SAFETYGLIDE INSULIN SYRINGE) 31G X 15/64" 0.5 ML MISC 1 each by Does not apply route 2 (two) times daily. 180 each 2  . lisinopril (PRINIVIL,ZESTRIL) 5 MG tablet Take 5 mg by mouth daily.    . pantoprazole (PROTONIX) 40 MG tablet     . rosuvastatin (CRESTOR) 40 MG tablet Take 40 mg by mouth daily.     No facility-administered medications prior to visit.    PAST MEDICAL HISTORY: Past Medical History:  Diagnosis Date  . COPD (chronic obstructive pulmonary disease) ([place])     stopped smoking 5 years ago  . Diabetes mellitus   . GERD (gastroesophageal reflux disease)   . Hypercholesterolemia   . Hypertension   . Memory difficulty 09/06/2018  . Peripheral vascular disease ([place])     PAST SURGICAL HISTORY: Past Surgical History:  Procedure Laterality Date  . AORTA - BILATERAL FEMORAL ARTERY BYPASS GRAFT N/A 08/11/2013   Procedure: AORTA BIFEMORAL BYPASS GRAFT;  Surgeon: Serafina Mitchell, MD;  Location: [facility];  Service: Vascular;  Laterality: N/A;  . COLONOSCOPY    . COLONOSCOPY W/ BIOPSIES AND POLYPECTOMY  2008  . HERNIA REPAIR      FAMILY HISTORY: Family History  Problem Relation Age of Onset  . Hyperlipidemia Mother   . Cancer Mother   . Hyperlipidemia Father   . Cancer Father   . Diabetes Neg Hx     SOCIAL HISTORY: Social History   Socioeconomic History  . Marital status: Widowed    Spouse name: Not on file  . Number of children: Not on file  . Years of education: Not on file  . Highest education level: Not on file  Occupational History  . Occupation: Retired  Tobacco Use  . Smoking status: Former Smoker    Packs/day: 1.00    Years: 50.00    Pack years: 50.00    Types: Cigarettes    Quit date: 01/14/2009    Years since quitting: 10.2  . Smokeless tobacco: Never Used  Substance and Sexual Activity  . Alcohol use: Yes    Alcohol/week: 9.0 standard drinks    Types: 3 Glasses of wine, 3 Cans of beer, 3 Shots of liquor per week  . Drug use: No  . Sexual activity: Not on file  Other Topics Concern  . Not on file  Social History Narrative   Right Handed    Lives at home alone   Caffeine 5 cups daily   Social Determinants of Health   Financial Resource Strain:   . Difficulty of Paying Living Expenses: Not on file  Food Insecurity:   . Worried About Charity fundraiser in the Last Year: Not on file  . Ran Out of Food in the Last Year: Not on file  Transportation Needs:   . Lack of Transportation (Medical): Not on file  . Lack of  Transportation (Non-Medical): Not on file  Physical Activity:   . Days of Exercise per Week: Not on file  . Minutes of Exercise per Session: Not on file  Stress:   . Feeling of Stress : Not on file  Social Connections:   . Frequency of Communication with Friends and Family: Not on file  . Frequency of Social Gatherings with Friends and Family: Not on file  . Attends Religious Services: Not on file  . Active Member of Clubs or Organizations: Not on file  .  Attends Archivist Meetings: Not on file  . Marital Status: Not on file  Intimate Partner Violence:   . Fear of Current or Ex-Partner: Not on file  . Emotionally Abused: Not on file  . Physically Abused: Not on file  . Sexually Abused: Not on file      PHYSICAL EXAM  There were no vitals filed for this visit. There is no height or weight on file to calculate BMI.  Generalized: Well developed, in no acute distress   Neurological examination  Mentation: Alert oriented to time, place, history taking. Follows all commands speech and language fluent Cranial nerve II-XII: Pupils were equal round reactive to light. Extraocular movements were full, visual field were full on confrontational test. Facial sensation and strength were normal. Uvula tongue midline. Head turning and shoulder shrug  were normal and symmetric. Motor: The motor testing reveals 5 over 5 strength of all 4 extremities. Good symmetric motor tone is noted throughout.  Sensory: Sensory testing is intact to soft touch on all 4 extremities. No evidence of extinction is noted.  Coordination: Cerebellar testing reveals good finger-nose-finger and heel-to-shin bilaterally.  Gait and station: Gait is normal. Tandem gait is normal. Romberg is negative. No drift is seen.  Reflexes: Deep tendon reflexes are symmetric and normal bilaterally.   DIAGNOSTIC DATA (LABS, IMAGING, TESTING) - I reviewed patient records, labs, notes, testing and imaging myself where  available.  Lab Results  Component Value Date   WBC 11.2 (H) 08/16/2013   HGB 9.6 (L) 08/16/2013   HCT 28.7 (L) 08/16/2013   MCV 83.2 08/16/2013   PLT 219 08/16/2013      Component Value Date/Time   NA 148 (H) 08/16/2013 0448   K 3.1 (L) 08/16/2013 0448   CL 111 08/16/2013 0448   CO2 25 08/16/2013 0448   GLUCOSE 81 08/16/2013 0448   BUN 14 08/16/2013 0448   CREATININE 0.55 08/16/2013 0448   CREATININE 0.81 05/26/2013 1440   CALCIUM 8.4 08/16/2013 0448   PROT 5.2 (L) 08/12/2013 0357   ALBUMIN 3.3 (L) 08/12/2013 0357   AST 33 08/12/2013 0357   ALT 26 08/12/2013 0357   ALKPHOS 50 08/12/2013 0357   BILITOT 0.8 08/12/2013 0357   GFRNONAA >90 08/16/2013 0448   GFRAA >90 08/16/2013 0448   No results found for: CHOL, HDL, LDLCALC, LDLDIRECT, TRIG, CHOLHDL Lab Results  Component Value Date   HGBA1C 6.4 (A) 04/21/2019   Lab Results  Component Value Date   [id number] 641 02/24/2018   No results found for: TSH    ASSESSMENT AND PLAN 79 y.o. year old male  has a past medical history of COPD (chronic obstructive pulmonary disease) ([place]), Diabetes mellitus, GERD (gastroesophageal reflux disease), Hypercholesterolemia, Hypertension, Memory difficulty (09/06/2018), and Peripheral vascular disease ([place]). here with ***   I spent 15 minutes with the patient. 50% of this time was spent   Butler Denmark, [place], [place] 04/26/2019, 8:37 PM [facility] [address], [place], [place] [address] [phone]

## 2019-04-27 ENCOUNTER — Ambulatory Visit: Payer: Medicare HMO | Admitting: Neurology

## 2019-04-27 ENCOUNTER — Encounter: Payer: Self-pay | Admitting: Neurology

## 2019-04-27 ENCOUNTER — Telehealth: Payer: Self-pay

## 2019-04-27 DIAGNOSIS — E1065 Type 1 diabetes mellitus with hyperglycemia: Secondary | ICD-10-CM | POA: Diagnosis not present

## 2019-04-27 NOTE — Telephone Encounter (Signed)
Company: [employer]  Document: CMN for CGM and supplies Other records requested: Office notes  All above requested information has been faxed successfully to [employer] listed above. Documents and fax confirmation have been placed in the faxed file for future reference.

## 2019-04-28 ENCOUNTER — Telehealth: Payer: Self-pay

## 2019-04-28 NOTE — Telephone Encounter (Signed)
SECOND FAX  Company: [employer]  Document: Once again requesting office notes Other records requested: SECOND TIME FAXING OFFICE NOTES  All above requested information has been faxed successfully to the Company listed above. Documents and fax confirmation have been placed in the faxed file for future reference.

## 2019-05-02 ENCOUNTER — Ambulatory Visit: Payer: Medicare HMO | Admitting: Endocrinology

## 2019-07-03 ENCOUNTER — Telehealth: Payer: Self-pay | Admitting: Endocrinology

## 2019-07-03 ENCOUNTER — Other Ambulatory Visit: Payer: Self-pay

## 2019-07-03 DIAGNOSIS — E1051 Type 1 diabetes mellitus with diabetic peripheral angiopathy without gangrene: Secondary | ICD-10-CM

## 2019-07-03 MED ORDER — DEXCOM G6 TRANSMITTER MISC: 1.0000 | 1 refills | Status: DC

## 2019-07-03 MED ORDER — DEXCOM G6 SENSOR MISC: 1.0000 | 1 refills | Status: DC

## 2019-07-03 NOTE — Telephone Encounter (Signed)
Patient calling to ask that one Dexcom transmitter and 3 sensors [facility] [place]. I let him know Medicare requires it to go through a DME and his is [facility]. Patient acknowledge this and said he is willing to pay for it out of pocket since he is out of town and needs these right away.  I have sent the requested items to the requested pharmacy in [facility] with a note to the pharmacist that patient is paying for it himself out of pocket.

## 2019-07-03 NOTE — Telephone Encounter (Signed)
Medication Refill Request  Did you call your pharmacy and request this refill first? Yes  . If patient has not contacted pharmacy first, instruct them to do so for future refills.  . Remind then that contacting the pharmacy for their refill is the quickest method to get the refill.  . Refill policy also stated that it will take anywhere between 24-72 hours to receive the refill.    Name of medication? Dexcom transmitter and sensor  Is this a 90 day supply? "3 mo for transmitter - 1 week for sensor"  Name and location of pharmacy?  [facility] [address] [place], [place] [address] Ph# [phone] / Fax# [phone]

## 2019-07-03 NOTE — Telephone Encounter (Signed)
Pt needs to request refills from his DME supplier as indicated below:  Pharmacy  [facility] - [place], [place] - [address] [address], [place]  [address] [address], [place] [address], [place] [address]  Phone:  [phone]  Fax:  [phone]    Above group manages his supplies. Left a detailed message informing pt about above information as well as including contact # as listed above.

## 2019-07-03 NOTE — Telephone Encounter (Signed)
Patient called back 13 minutes after requesting RX to see if he had been filled.  I did advise that RX can take 24-72 hours, but that I would advise clinical stat that he has been out for 3 days and has not been able to eat because of this.

## 2019-07-12 ENCOUNTER — Telehealth: Payer: Self-pay

## 2019-07-12 NOTE — Telephone Encounter (Signed)
FAXED DOCUMENTS  Company: [employer]  Document: Chronic Condition Verification for Diabetes Other records requested: Office notes  All above requested information has been faxed successfully to [employer] listed above. Documents and fax confirmation have been placed in the faxed file for future reference.

## 2019-07-25 ENCOUNTER — Other Ambulatory Visit: Payer: Self-pay

## 2019-07-27 ENCOUNTER — Other Ambulatory Visit: Payer: Self-pay

## 2019-07-27 ENCOUNTER — Other Ambulatory Visit: Payer: Self-pay | Admitting: General Practice

## 2019-07-27 ENCOUNTER — Encounter: Payer: Self-pay | Admitting: Endocrinology

## 2019-07-27 ENCOUNTER — Ambulatory Visit (INDEPENDENT_AMBULATORY_CARE_PROVIDER_SITE_OTHER): Payer: HMO | Admitting: Endocrinology

## 2019-07-27 VITALS — BP 130/60 | HR 66 | Ht 66.0 in | Wt 138.0 lb

## 2019-07-27 DIAGNOSIS — E1051 Type 1 diabetes mellitus with diabetic peripheral angiopathy without gangrene: Secondary | ICD-10-CM

## 2019-07-27 LAB — POCT GLYCOSYLATED HEMOGLOBIN (HGB A1C): Hemoglobin A1C: 6.6 % — AB (ref 4.0–5.6)

## 2019-07-27 NOTE — Patient Outreach (Signed)
[facility]) Care Management  07/27/2019  Steven Sanders November 24, 1940 [id number]  Client is newly enrolled in the Special Needs Plan program with Type 1 Diabetes. Individualized Care Plan (ICP) completed with information from the Health Risk Assessment on file. Client also has a history of Hypertension, Hyperlipidemia and Polyneuropathy. Will send an introductory letter with ICP to the primary provider and client, along with educational materials. No ED or acute care admissions found in the medical record since 2018. Assigned RN Care Coordinator will follow up within 3 months.

## 2019-07-27 NOTE — Progress Notes (Signed)
Subjective:    Patient ID: Steven Sanders, male    DOB: 1940/09/04, 79 y.o.   MRN: [id number]  HPI Pt returns for f/u of diabetes mellitus: DM type: 1 Dx'ed: 5397 Complications: PN, DN, DR, and PAD.  Therapy: insulin since dx.   DKA: never. Severe hypoglycemia: multiple episodes, most recently in 2015.   Pancreatitis: never.  Other: he has declined multiple daily injections; he has a dexcom-G6 continuous glucose monitor; in 2020, he chose to d/c pump.   Interval history: I reviewed continuous glucose monitor data, and it is scanned into the record.  Glucose varies from 60-200. It is in general lowest at lunch.  He still takes 70/30 insulin, 30 units qam.  He takes 0-2 units of reg insulin per day.  He says he has mild hypoglycemia approx twice per week.   Past Medical History:  Diagnosis Date  . COPD (chronic obstructive pulmonary disease) ([place])    stopped smoking 5 years ago  . Diabetes mellitus   . GERD (gastroesophageal reflux disease)   . Hypercholesterolemia   . Hypertension   . Memory difficulty 09/06/2018  . Peripheral vascular disease [facility])     Past Surgical History:  Procedure Laterality Date  . AORTA - BILATERAL FEMORAL ARTERY BYPASS GRAFT N/A 08/11/2013   Procedure: AORTA BIFEMORAL BYPASS GRAFT;  Surgeon: Serafina Mitchell, MD;  Location: [facility];  Service: Vascular;  Laterality: N/A;  . COLONOSCOPY    . COLONOSCOPY W/ BIOPSIES AND POLYPECTOMY  2008  . HERNIA REPAIR      Social History   Socioeconomic History  . Marital status: Widowed    Spouse name: Not on file  . Number of children: Not on file  . Years of education: Not on file  . Highest education level: Not on file  Occupational History  . Occupation: Retired  Tobacco Use  . Smoking status: Former Smoker    Packs/day: 1.00    Years: 50.00    Pack years: 50.00    Types: Cigarettes    Quit date: 01/14/2009    Years since quitting: 10.5  . Smokeless tobacco: Never Used  Substance and Sexual Activity   . Alcohol use: Yes    Alcohol/week: 9.0 standard drinks    Types: 3 Glasses of wine, 3 Cans of beer, 3 Shots of liquor per week  . Drug use: No  . Sexual activity: Not on file  Other Topics Concern  . Not on file  Social History Narrative   Right Handed    Lives at home alone   Caffeine 5 cups daily   Social Determinants of Health   Financial Resource Strain:   . Difficulty of Paying Living Expenses:   Food Insecurity:   . Worried About Charity fundraiser in the Last Year:   . Arboriculturist in the Last Year:   Transportation Needs:   . Film/video editor (Medical):   Marland Kitchen Lack of Transportation (Non-Medical):   Physical Activity:   . Days of Exercise per Week:   . Minutes of Exercise per Session:   Stress:   . Feeling of Stress :   Social Connections:   . Frequency of Communication with Friends and Family:   . Frequency of Social Gatherings with Friends and Family:   . Attends Religious Services:   . Active Member of Clubs or Organizations:   . Attends Archivist Meetings:   Marland Kitchen Marital Status:   Intimate Partner Violence:   .  Fear of Current or Ex-Partner:   . Emotionally Abused:   Marland Kitchen Physically Abused:   . Sexually Abused:     Current Outpatient Medications on File Prior to Visit  Medication Sig Dispense Refill  . Accu-Chek Softclix Lancets lancets 1 each by Other route 4 (four) times daily. Use to monitor glucose levels four times daily; E10.51 100 each 3  . Alcohol Swabs (B-D SINGLE USE SWABS REGULAR) PADS 1 each by Does not apply route 4 (four) times daily. Use to cleanse site prior to obtaining droplet for glucose monitoring; E10.51 100 each 3  . aspirin 81 MG tablet Take 81 mg by mouth daily. Reported on 04/22/2015    . Blood Glucose Calibration (ACCU-CHEK AVIVA) SOLN 1 each by In Vitro route as needed. Use to calibrate meter prn; E10.51 1 each 0  . Blood Glucose Monitoring Suppl (ACCU-CHEK AVIVA PLUS) w/Device KIT 1 each by Other route 4 (four) times  daily. Use to monitor glucose levels four times daily; E10.51 1 kit 0  . cilostazol (PLETAL) 100 MG tablet TAKE 1 TABLET TWICE DAILY BEFORE MEALS 180 tablet 3  . Continuous Blood Gluc Sensor (DEXCOM G6 SENSOR) MISC 1 Device by Does not apply route See admin instructions. Change every 10 days 3 each 1  . Continuous Blood Gluc Transmit (DEXCOM G6 TRANSMITTER) MISC 1 kit by Does not apply route every 3 (three) months. 1 each 1  . donepezil (ARICEPT) 5 MG tablet TAKE 1 TABLET BY MOUTH EVERYDAY AT BEDTIME 30 tablet 1  . DROPLET INSULIN SYRINGE 31G X 5/16" 0.5 ML MISC USE TO INJECT INSULIN 2 TIMES PER DAY 200 each 2  . glucose blood (ACCU-CHEK AVIVA PLUS) test strip 1 each by Other route 4 (four) times daily. Use to monitor glucose levels four times daily; E10.51 100 each 3  . insulin NPH-regular Human (NOVOLIN 70/30) (70-30) 100 UNIT/ML injection Inject 28 Units into the skin daily with breakfast. 10 mL 11  . Insulin Syringe-Needle U-100 (BD SAFETYGLIDE INSULIN SYRINGE) 31G X 15/64" 0.5 ML MISC 1 each by Does not apply route 2 (two) times daily. 180 each 2  . lisinopril (PRINIVIL,ZESTRIL) 5 MG tablet Take 5 mg by mouth daily.    . pantoprazole (PROTONIX) 40 MG tablet     . rosuvastatin (CRESTOR) 40 MG tablet Take 40 mg by mouth daily.     No current facility-administered medications on file prior to visit.    Allergies  Allergen Reactions  . Neomycin Other (See Comments)    unknown  . Tea Tree Oil Other (See Comments)    unknown  . Simvastatin   . Sulfa Antibiotics Other (See Comments)    unknown  . Other Rash    Chlorhexidine gluconate    Family History  Problem Relation Age of Onset  . Hyperlipidemia Mother   . Cancer Mother   . Hyperlipidemia Father   . Cancer Father   . Diabetes Neg Hx     BP 130/60   Pulse 66   Ht '5\' 6"'$  (1.676 m)   Wt 138 lb (62.6 kg)   SpO2 95%   BMI 22.27 kg/m    Review of Systems Denies lOC    Objective:   Physical Exam VITAL SIGNS:  See vs  page GENERAL: no distress Pulses: dorsalis pedis intact bilat.   MSK: no deformity of the feet CV: no leg edema Skin:  no ulcer on the feet.  normal color and temp on the feet. Neuro: sensation is intact  to touch on the feet.    Lab Results  Component Value Date   CREATININE 0.55 08/16/2013   BUN 14 08/16/2013   NA 148 (H) 08/16/2013   K 3.1 (L) 08/16/2013   CL 111 08/16/2013   CO2 25 08/16/2013    Lab Results  Component Value Date   HGBA1C 6.6 (A) 07/27/2019       Assessment & Plan:  Type 1 DM, with PAD: he may need to change to NPH, but we'll try reducing 70/30 and eliminating reg insulin first Hypoglycemia, due to insulin: this limits aggressiveness of glycemic control.   Patient Instructions  Please reduce the insulin to 28 units each morning, and avoid the 70/30 altogether. Please come back for a follow-up appointment in 2 months.

## 2019-07-27 NOTE — Patient Instructions (Addendum)
Please reduce the insulin to 28 units each morning, and avoid the 70/30 altogether. Please come back for a follow-up appointment in 2 months.

## 2019-08-15 ENCOUNTER — Other Ambulatory Visit: Payer: Self-pay

## 2019-08-15 NOTE — Patient Outreach (Signed)
°  [facility]) Care Management Chronic Special Needs Program    08/15/2019  Name: Steven Sanders, DOB: 10-May-1940  MRN: [id number]   [name] is enrolled in a chronic special needs plan for Diabetes. Telephone call to client for Health risk assessment review and initial assessment outreach. Unable to reach. HIPAA compliant voice message left with call back phone number.   PLAN; RNCM will attempt 2nd telephone call to client in 1 week.   George Ina RN,BSN,CCM Chronic Care Management Coordinator [facility] Care Management [phone]

## 2019-08-22 ENCOUNTER — Telehealth: Payer: Self-pay

## 2019-08-22 ENCOUNTER — Other Ambulatory Visit: Payer: Self-pay

## 2019-08-22 ENCOUNTER — Telehealth: Payer: Self-pay | Admitting: Endocrinology

## 2019-08-22 DIAGNOSIS — E1051 Type 1 diabetes mellitus with diabetic peripheral angiopathy without gangrene: Secondary | ICD-10-CM

## 2019-08-22 MED ORDER — DEXCOM G6 SENSOR MISC: 1.0000 | 1 refills | Status: DC

## 2019-08-22 MED ORDER — DEXCOM G6 TRANSMITTER MISC: 1.0000 | 1 refills | Status: DC

## 2019-08-22 NOTE — Telephone Encounter (Signed)
[facility] -  [facility] [address] [place], [place] [address] Fax# [phone]  (dexcom sensors and transmitters)

## 2019-08-22 NOTE — Telephone Encounter (Signed)
Medication Refill Request  Did you call your pharmacy and request this refill first? Yes   If patient has not contacted pharmacy first, instruct them to do so for future refills.   Remind them that contacting the pharmacy for their refill is the quickest method to get the refill.   Refill policy also stated that it will take anywhere between 24-72 hours to receive the refill.    Name of medication? Dexcom transmitters and sensors  Is this a 90 day supply?   [id number] [place] [place] - [address] Phone:  [phone]  Fax:  [phone]

## 2019-08-22 NOTE — Telephone Encounter (Signed)
Patient calling to find out if Rx for Dexcom suipplies  was sent to [facility] or not-I made him aware that we have sent it and he will need to contact the pharmacy to confirm it is ready for pick-up

## 2019-08-22 NOTE — Telephone Encounter (Signed)
Rx sent 

## 2019-08-24 ENCOUNTER — Ambulatory Visit: Payer: Self-pay

## 2019-08-30 ENCOUNTER — Telehealth: Payer: Self-pay

## 2019-08-30 NOTE — Telephone Encounter (Signed)
FAXED DOCUMENTS  Company: [employer]  Document: Forensic scientist for [employer] and [employer]ensors Other records requested: None requested  All above requested information has been faxed successfully to [employer] listed above. Documents and fax confirmation have been placed in the faxed file for future reference.

## 2019-09-01 NOTE — Telephone Encounter (Signed)
DENIAL - Medicare Part D; APPROVED - Medicare Part B  CGM: Estate manager/land agent Company: Manufacturing engineer and [employer] PA response: DENIED - both transmitter and sensors under Medicare Part D but APPROVED both under Medicare Part B  Documents have been labeled and placed in scan file for HIM and for our future reference.

## 2019-09-06 ENCOUNTER — Other Ambulatory Visit: Payer: Self-pay

## 2019-09-06 DIAGNOSIS — E1051 Type 1 diabetes mellitus with diabetic peripheral angiopathy without gangrene: Secondary | ICD-10-CM

## 2019-09-06 MED ORDER — DEXCOM G6 SENSOR MISC: 1.0000 | 1 refills | Status: DC

## 2019-09-12 ENCOUNTER — Ambulatory Visit: Payer: Self-pay

## 2019-09-19 ENCOUNTER — Ambulatory Visit: Payer: Self-pay

## 2019-10-09 ENCOUNTER — Telehealth: Payer: Self-pay | Admitting: Neurology

## 2019-10-09 NOTE — Telephone Encounter (Signed)
Pt called stating that he does not think the donepezil (ARICEPT) 5 MG tablet is not working for him and would like to speak to RN regarding this. Please advise.

## 2019-10-09 NOTE — Telephone Encounter (Signed)
Attempted to call pt, LVM for pt to call back  

## 2019-10-10 NOTE — Telephone Encounter (Signed)
Attempted to call pt, LVM for call back  °

## 2019-10-10 NOTE — Telephone Encounter (Signed)
Spoke to pt he states memory has not improved  He is due for a fu  Appt made with [name] NP.

## 2019-10-12 ENCOUNTER — Other Ambulatory Visit: Payer: Self-pay

## 2019-10-12 NOTE — Patient Outreach (Signed)
  [facility]) Care Management Chronic Special Needs Program    10/12/2019  Name: Steven Sanders, Steven Sanders: 04/16/1940  MRN: [id number]   [name] is enrolled in a chronic special needs plan for Diabetes. Telephone call to client for review of Health risk assessment and initial outreach.  Unable to reach. HIPAA compliant voice message left with call back phone number and return call request.  PLAN; RNCM will attempt 3rd telephone call to client in 1 week.   George Ina RN,BSN,CCM Chronic Care Management Coordinator [facility] Care Management [phone]

## 2019-10-20 ENCOUNTER — Other Ambulatory Visit: Payer: Self-pay

## 2019-10-20 NOTE — Patient Outreach (Signed)
  [facility]) Care Management Chronic Special Needs Program  10/20/2019  Name: Steven Sanders DOB: 10/20/1940  MRN: [id number]  [name] is enrolled in a chronic special needs plan for Diabetes. Chronic Care Management Coordinator telephoned client to review health risk assessment and to develop individualized care plan. Unable to reach.  HIPAA compliant voice message left with call back phone number.  Individualized care plan updated based on completed Health risk assessment and available data from medical record.   Goals Addressed            This Visit's Progress   . Client understands the importance of follow-up with providers by attending scheduled visits       Primary care provider visit 02/21/19 and 08/04/18 Endocrinology visit: 04/21/19, 07/27/19 Continue to follow up with your providers as recommended.     Marland Kitchen HEMOGLOBIN A1C < 7       Congratulations on your A1C being within goal!  It is important to have your Hemoglobin A1C checked every 6 months if you are at goal. Continue to:  Follow a low carbohydrate low salt diet and to watch portion sizes.  Review use and possible side effects of diabetes medications.  Review signs and symptoms of hypoglycemia and actions to take.  Review diabetes action plan in [employer] calendar. Review handouts, diabetes checklist and diabetes-controlling blood sugar information.    . Maintain timely refills of diabetic medication as prescribed within the year .       Contact your RN case manager if you are unable to obtain your medications Contact your doctor if you have questions regarding your medicines.  Take your medication as prescribed.     . Obtain Annual Foot Exam       RN provided [employer] Education on Foot Care for Diabetes and Diabetic Neuropathy. Diabetes foot care - Check feet daily at home (look for skin color changes, cuts, sores or cracks in the skin, swelling of feet or ankles, ingrown or fungal  toenails, corn or calluses). Report these findings to your doctor - Wash feet with soap and water, dry feet well especially between toes - Moisturize your feet but not between the toes - Always wear shoes that protect your whole feet.      . COMPLETED: Obtain annual screen for micro albuminuria (urine) , nephropathy (kidney problems)   On track    Micro albuminuria completed 08/23/19    . Obtain Hemoglobin A1C at least 2 times per year       Hgb A1c completed on 07/27/19 Continue to keep your follow up appointments with your provider and have lab work completed as recommended.     . Visit Primary Care Provider or Endocrinologist at least 2 times per year        Primary care provider visit 02/21/19 and 08/04/18 Endocrinology visit: 04/21/19, 07/27/19 Continue to follow up with your providers as recommended.        Plan:  Send successful outreach letter with a copy of their individualized care plan and Send individual care plan to provider RNCM will follow up with client in 12 months.     George Ina RN,BSN,CCM Chronic Care Management Coordinator [facility] Care Management [phone]

## 2019-10-20 NOTE — Patient Outreach (Addendum)
[facility]) Care Management Chronic Special Needs Program    10/20/2019  Name: [name], [name]: 1940/05/21  MRN: [id number]   [name] is enrolled in a chronic special needs plan for Diabetes. Return call received from client. HIPAA verified. Client reports he is doing well.  Client states he has a Dexcom that checks his blood sugars and alerts him when his blood sugars are high or low. Client states he is comfortable with managing his blood sugars if they become low. He reports his most recent Hgb A1c was 6.6.   Client states he does have some issues with his memory. He reports taking his prescribed medication for memory as recommended. Client reports he walks at least 5 miles every day and he enjoys reading. RNCM discussed Advanced Directive with client. Client agreeable to having Advanced directive packet mailed to him. Client denies any further needs at this time.   Goals    .  Acknowledge receipt of Advanced Directive package     RN case manager will send client Advanced Directive packet Please contact your RN case manager if you have questions.     Marland Kitchen "I want to continue to maintain my health"     Continue to exercise at least 3 times per week.  Plan to eat low carbohydrate and low salt meals, watch portion sizes and avoid sugar sweetened drinks.  Continue to take your medications as prescribed.  Contact your doctor if you have questions or concerns.  RN case manager will send client education article:  Preventive health care for older adults.      . Client understands the importance of follow-up with providers by attending scheduled visits     Primary care provider visit 02/21/19 and 08/04/18 Endocrinology visit: 04/21/19, 07/27/19 Continue to follow up with your providers as recommended.     . Client will report at least 3 options for managing low blood sugar ( hypoglycemia) in     Please follow the RULE OF 15 for the treatment of hypoglycemia  treatment (When your blood sugars are less than 70 mg/ dl) STEP  1:  Take 15 grams of carbohydrates when your blood sugar is low, which includes:   3-4 glucose tabs or  3-4 oz of juice or regular soda or  One tube of glucose gel STEP 2:  Recheck blood sugar in 15 minutes STEP 3:  If your blood sugar is still low at the 15 minute recheck ---then, go back to STEP 1 and treat again with another 15 grams of carbohydrates Review [employer] calendar sent in the mail for Diabetes Action plan Low blood sugar signs/ symptoms discussed with client.    . Client will verbalize knowledge of self management of Hypertension as evidences by BP reading of 140/90 or less; or as defined by provider     RN provided education via [employer] calendar. If you do not have a B/P monitor (cuff), one can be provided to you. Plan to check B/P regularly and take B/P medications as ordered. Plan to follow a low salt diet and increase activity as tolerated.    . Client/Caregiver will verbalize understanding of instructions related to self-care and safety     Take prescribed memory medication as recommended.  RN case manager will send client education article: How to help your memory Contact your doctor if you notice an increase in symptoms.  Safety from falls:  Marland Kitchen Make sure there is good lighting throughout your home .  Make sure walkways are clear of clutter, cords and throw rugs . Make sure furniture is secure, sturdy and does not swivel . Grab bars are suggested near the toilet, tub and shower . Use an assistive device ( cane/ walker) if advised by your doctor.  . Turn on the lights when you go into a dark area.  Use night- lights Keep items that you use often in easy to reach places.     Marland Kitchen HEMOGLOBIN A1C < 7     Congratulations on your A1C being within goal!  It is important to have your Hemoglobin A1C checked every 6 months if you are at goal. Continue to:  Follow a low carbohydrate low salt  diet and to watch portion sizes.  Review use and possible side effects of diabetes medications.  Review signs and symptoms of hypoglycemia and actions to take.  Review diabetes action plan in [employer] calendar. Review handouts, diabetes checklist and diabetes-controlling blood sugar information.    . Maintain timely refills of diabetic medication as prescribed within the year .     Contact your RN case manager if you are unable to obtain your medications Contact your doctor if you have questions regarding your medicines.  Take your medication as prescribed.     . Obtain annual  Lipid Profile, LDL-C     Try to avoid saturated fats, trans-fats and eat more fiber. Plan to take statin (cholesterol) medicine as ordered. RN provided [employer] on High Cholesterol.    . Obtain Annual Foot Exam     RN provided Emmi Education on Foot Care for Diabetes and Diabetic Neuropathy. Diabetes foot care - Check feet daily at home (look for skin color changes, cuts, sores or cracks in the skin, swelling of feet or ankles, ingrown or fungal toenails, corn or calluses). Report these findings to your doctor - Wash feet with soap and water, dry feet well especially between toes - Moisturize your feet but not between the toes - Always wear shoes that protect your whole feet.      Clydene Pugh Hemoglobin A1C at least 2 times per year     Hgb A1c completed on 07/27/19 Continue to keep your follow up appointments with your provider and have lab work completed as recommended.     . Visit Primary Care Provider or Endocrinologist at least 2 times per year      Primary care provider visit 02/21/19 and 08/04/18 Endocrinology visit: 04/21/19, 07/27/19 Continue to follow up with your providers as recommended.       PLAN; RNCM will send client and primary care provider updated individualized care plan  RNCM will follow up with client in 12 months.  RNCM will send client education articles.    George Ina RN,BSN,CCM Chronic Care Management Coordinator [facility] Care Management [phone]

## 2019-11-01 ENCOUNTER — Encounter: Payer: Self-pay | Admitting: Endocrinology

## 2019-11-01 ENCOUNTER — Other Ambulatory Visit: Payer: Self-pay

## 2019-11-01 ENCOUNTER — Ambulatory Visit (INDEPENDENT_AMBULATORY_CARE_PROVIDER_SITE_OTHER): Payer: HMO | Admitting: Endocrinology

## 2019-11-01 VITALS — BP 132/70 | HR 72 | Ht 66.0 in | Wt 133.4 lb

## 2019-11-01 DIAGNOSIS — E1051 Type 1 diabetes mellitus with diabetic peripheral angiopathy without gangrene: Secondary | ICD-10-CM | POA: Diagnosis not present

## 2019-11-01 LAB — POCT GLYCOSYLATED HEMOGLOBIN (HGB A1C): Hemoglobin A1C: 6.4 % — AB (ref 4.0–5.6)

## 2019-11-01 NOTE — Progress Notes (Signed)
 Subjective:    Patient ID: Steven Sanders, male    DOB: 01/06/1941, 79 y.o.   MRN: [id number]  HPI Pt returns for f/u of diabetes mellitus: DM type: 1 Dx'ed: 1960 Complications: PN, DN, DR, and PAD.  Therapy: insulin since dx.   DKA: never. Severe hypoglycemia: multiple episodes, most recently in 2015.   Pancreatitis: never.  SDOH: he takes human insulin, due to cost.   Other: he has declined multiple daily injections; he has a dexcom-G6 continuous glucose monitor; in 2020, he chose to d/c pump.   Interval history: I reviewed continuous glucose monitor data, and it is scanned into the record.  Glucose varies from 60-190. It is in general lowest fasting and at lunch, but there is little trand throughout the day.  He still takes 70/30 insulin, 30 units qam, and PRN regular.  We discussed the typo on the AVS at last ov here--he says he understood the correct instructions, and did not implement the typo.  He says he has mild hypoglycemia approx once per week.   Past Medical History:  Diagnosis Date  . COPD (chronic obstructive pulmonary disease) (HCC)    stopped smoking 5 years ago  . Diabetes mellitus   . GERD (gastroesophageal reflux disease)   . Hypercholesterolemia   . Hypertension   . Memory difficulty 09/06/2018  . Peripheral vascular disease (HCC)     Past Surgical History:  Procedure Laterality Date  . AORTA - BILATERAL FEMORAL ARTERY BYPASS GRAFT N/A 08/11/2013   Procedure: AORTA BIFEMORAL BYPASS GRAFT;  Surgeon: Vance W Brabham, MD;  Location: [facility] OR;  Service: Vascular;  Laterality: N/A;  . COLONOSCOPY    . COLONOSCOPY W/ BIOPSIES AND POLYPECTOMY  2008  . HERNIA REPAIR      Social History   Socioeconomic History  . Marital status: Widowed    Spouse name: Not on file  . Number of children: Not on file  . Years of education: Not on file  . Highest education level: Not on file  Occupational History  . Occupation: Retired  Tobacco Use  . Smoking status: Former  Smoker    Packs/day: 1.00    Years: 50.00    Pack years: 50.00    Types: Cigarettes    Quit date: 01/14/2009    Years since quitting: 10.8  . Smokeless tobacco: Never Used  Vaping Use  . Vaping Use: Never used  Substance and Sexual Activity  . Alcohol use: Yes    Alcohol/week: 9.0 standard drinks    Types: 3 Glasses of wine, 3 Cans of beer, 3 Shots of liquor per week  . Drug use: No  . Sexual activity: Not on file  Other Topics Concern  . Not on file  Social History Narrative   Right Handed    Lives at home alone   Caffeine 5 cups daily   Social Determinants of Health   Financial Resource Strain:   . Difficulty of Paying Living Expenses: Not on file  Food Insecurity: No Food Insecurity  . Worried About Running Out of Food in the Last Year: Never true  . Ran Out of Food in the Last Year: Never true  Transportation Needs: No Transportation Needs  . Lack of Transportation (Medical): No  . Lack of Transportation (Non-Medical): No  Physical Activity:   . Days of Exercise per Week: Not on file  . Minutes of Exercise per Session: Not on file  Stress:   . Feeling of Stress : Not on file    Social Connections:   . Frequency of Communication with Friends and Family: Not on file  . Frequency of Social Gatherings with Friends and Family: Not on file  . Attends Religious Services: Not on file  . Active Member of Clubs or Organizations: Not on file  . Attends Club or Organization Meetings: Not on file  . Marital Status: Not on file  Intimate Partner Violence:   . Fear of Current or Ex-Partner: Not on file  . Emotionally Abused: Not on file  . Physically Abused: Not on file  . Sexually Abused: Not on file    Current Outpatient Medications on File Prior to Visit  Medication Sig Dispense Refill  . Accu-Chek Softclix Lancets lancets 1 each by Other route 4 (four) times daily. Use to monitor glucose levels four times daily; E10.51 100 each 3  . Alcohol Swabs (B-D SINGLE USE SWABS  REGULAR) PADS 1 each by Does not apply route 4 (four) times daily. Use to cleanse site prior to obtaining droplet for glucose monitoring; E10.51 100 each 3  . aspirin 81 MG tablet Take 81 mg by mouth daily. Reported on 04/22/2015    . Blood Glucose Calibration (ACCU-CHEK AVIVA) SOLN 1 each by In Vitro route as needed. Use to calibrate meter prn; E10.51 1 each 0  . Blood Glucose Monitoring Suppl (ACCU-CHEK AVIVA PLUS) w/Device KIT 1 each by Other route 4 (four) times daily. Use to monitor glucose levels four times daily; E10.51 1 kit 0  . cilostazol (PLETAL) 100 MG tablet TAKE 1 TABLET TWICE DAILY BEFORE MEALS 180 tablet 3  . donepezil (ARICEPT) 5 MG tablet TAKE 1 TABLET BY MOUTH EVERYDAY AT BEDTIME 30 tablet 1  . DROPLET INSULIN SYRINGE 31G X 5/16" 0.5 ML MISC USE TO INJECT INSULIN 2 TIMES PER DAY 200 each 2  . glucose blood (ACCU-CHEK AVIVA PLUS) test strip 1 each by Other route 4 (four) times daily. Use to monitor glucose levels four times daily; E10.51 100 each 3  . insulin NPH-regular Human (NOVOLIN 70/30) (70-30) 100 UNIT/ML injection Inject 28 Units into the skin daily with breakfast. 10 mL 11  . Insulin Syringe-Needle U-100 (BD SAFETYGLIDE INSULIN SYRINGE) 31G X 15/64" 0.5 ML MISC 1 each by Does not apply route 2 (two) times daily. 180 each 2  . lisinopril (PRINIVIL,ZESTRIL) 5 MG tablet Take 5 mg by mouth daily.    . pantoprazole (PROTONIX) 40 MG tablet     . rosuvastatin (CRESTOR) 40 MG tablet Take 40 mg by mouth daily.     No current facility-administered medications on file prior to visit.    Allergies  Allergen Reactions  . Neomycin Other (See Comments)    unknown  . Tea Tree Oil Other (See Comments)    unknown  . Simvastatin   . Sulfa Antibiotics Other (See Comments)    unknown  . Other Rash    Chlorhexidine gluconate    Family History  Problem Relation Age of Onset  . Hyperlipidemia Mother   . Cancer Mother   . Hyperlipidemia Father   . Cancer Father   . Diabetes Neg Hx      BP 132/70   Pulse 72   Ht 5' 6" (1.676 m)   Wt 133 lb 6.4 oz (60.5 kg)   SpO2 99%   BMI 21.53 kg/m    Review of Systems Denies LOC.      Objective:   Physical Exam VITAL SIGNS:  See vs page.   GENERAL: no distress.     Pulses: dorsalis pedis intact bilat.   MSK: no deformity of the feet CV: no leg edema Skin:  no ulcer on the feet.  normal color and temp on the feet.  Neuro: sensation is intact to touch on the feet.    Lab Results  Component Value Date   HGBA1C 6.4 (A) 11/01/2019       Assessment & Plan:  Type 1 DM, with PAD Hypoglycemia, due to insulin: this limits aggressiveness of glycemic control  Patient Instructions  Please reduce the 70/30 insulin to 28 units each morning, and avoid the regular altogether.  On this type of insulin schedule, you should eat meals on a regular schedule.  If a meal is missed or significantly delayed, your blood sugar could go low.  Please come back for a follow-up appointment in 2 months.      

## 2019-11-01 NOTE — Patient Instructions (Addendum)
Please reduce the 70/30 insulin to 28 units each morning, and avoid the regular altogether.  On this type of insulin schedule, you should eat meals on a regular schedule.  If a meal is missed or significantly delayed, your blood sugar could go low.  Please come back for a follow-up appointment in 2 months.

## 2019-11-03 ENCOUNTER — Other Ambulatory Visit: Payer: Self-pay

## 2019-11-03 ENCOUNTER — Telehealth: Payer: Self-pay | Admitting: Endocrinology

## 2019-11-03 DIAGNOSIS — E1051 Type 1 diabetes mellitus with diabetic peripheral angiopathy without gangrene: Secondary | ICD-10-CM

## 2019-11-03 MED ORDER — DEXCOM G6 SENSOR MISC: 1.0000 | 99 refills | Status: DC

## 2019-11-03 MED ORDER — DEXCOM G6 TRANSMITTER MISC: 1.0000 | 99 refills | Status: DC

## 2019-11-03 NOTE — Telephone Encounter (Signed)
Please review and advise. # of refills was sent with intention that pt would remain complaint with regular follow up appts.

## 2019-11-03 NOTE — Telephone Encounter (Signed)
please refill x 3 mos, with PRN refills

## 2019-11-03 NOTE — Telephone Encounter (Signed)
Outpatient Medication Detail   Disp Refills Start End   Continuous Blood Gluc Sensor (DEXCOM G6 SENSOR) MISC 9 each prn 11/03/2019    Sig - Route: 1 each by Other route See admin instructions. Change sensor every 10 days - Other   Sent to pharmacy as: Continuous Blood Gluc Sensor (DEXCOM G6 SENSOR) Misc   Notes to Pharmacy: NOT BILLING INSURANCE SINCE WE ARE NOT DOING PA. DOES NOT MEET CRITERIA. REQUESTING 3 MO SUPPLY. WILL PAY OUT OF POCKET   E-Prescribing Status: Receipt confirmed by pharmacy (11/03/2019  1:24 PM EDT)    Outpatient Medication Detail   Disp Refills Start End   Continuous Blood Gluc Transmit (DEXCOM G6 TRANSMITTER) MISC 1 each prn 11/03/2019    Sig - Route: 1 each by Other route every 3 (three) months. - Other   Sent to pharmacy as: Continuous Blood Gluc Transmit (DEXCOM G6 TRANSMITTER) Misc   Notes to Pharmacy: NOT BILLING INSURANCE SINCE WE ARE NOT DOING PA. DOES NOT MEET CRITERIA. REQUESTING 3 MO SUPPLY. WILL PAY OUT OF POCKET   E-Prescribing Status: Receipt confirmed by pharmacy (11/03/2019  1:24 PM EDT)

## 2019-11-03 NOTE — Telephone Encounter (Signed)
Patient requests to have more refills than just one for sensors and transmitters.

## 2019-11-15 ENCOUNTER — Ambulatory Visit: Payer: Self-pay | Admitting: Neurology

## 2019-12-13 ENCOUNTER — Other Ambulatory Visit: Payer: Self-pay

## 2019-12-13 DIAGNOSIS — K219 Gastro-esophageal reflux disease without esophagitis: Secondary | ICD-10-CM | POA: Insufficient documentation

## 2019-12-13 DIAGNOSIS — E1039 Type 1 diabetes mellitus with other diabetic ophthalmic complication: Secondary | ICD-10-CM | POA: Insufficient documentation

## 2019-12-13 DIAGNOSIS — R809 Proteinuria, unspecified: Secondary | ICD-10-CM | POA: Insufficient documentation

## 2019-12-13 DIAGNOSIS — I779 Disorder of arteries and arterioles, unspecified: Secondary | ICD-10-CM

## 2019-12-13 DIAGNOSIS — Z86010 Personal history of colonic polyps: Secondary | ICD-10-CM | POA: Insufficient documentation

## 2019-12-13 DIAGNOSIS — R0989 Other specified symptoms and signs involving the circulatory and respiratory systems: Secondary | ICD-10-CM

## 2019-12-13 DIAGNOSIS — I739 Peripheral vascular disease, unspecified: Secondary | ICD-10-CM

## 2019-12-13 DIAGNOSIS — E7800 Pure hypercholesterolemia, unspecified: Secondary | ICD-10-CM | POA: Insufficient documentation

## 2019-12-28 ENCOUNTER — Other Ambulatory Visit: Payer: Self-pay

## 2019-12-28 ENCOUNTER — Other Ambulatory Visit: Payer: Self-pay | Admitting: Surgery

## 2019-12-28 ENCOUNTER — Ambulatory Visit (INDEPENDENT_AMBULATORY_CARE_PROVIDER_SITE_OTHER)
Admission: RE | Admit: 2019-12-28 | Discharge: 2019-12-28 | Disposition: A | Discharge status: Home / Self Care | Payer: HMO | Source: Ambulatory Visit | Attending: Vascular Surgery | Admitting: Vascular Surgery

## 2019-12-28 ENCOUNTER — Ambulatory Visit (INDEPENDENT_AMBULATORY_CARE_PROVIDER_SITE_OTHER): Payer: HMO | Admitting: Physician Assistant

## 2019-12-28 ENCOUNTER — Ambulatory Visit (HOSPITAL_COMMUNITY)
Admission: RE | Admit: 2019-12-28 | Discharge: 2019-12-28 | Disposition: A | Discharge status: Home / Self Care | Payer: HMO | Source: Ambulatory Visit | Attending: Vascular Surgery | Admitting: Vascular Surgery

## 2019-12-28 VITALS — BP 135/64 | HR 63 | Temp 98.3°F | Resp 20 | Ht 66.0 in | Wt 133.8 lb

## 2019-12-28 DIAGNOSIS — I7409 Other arterial embolism and thrombosis of abdominal aorta: Secondary | ICD-10-CM

## 2019-12-28 DIAGNOSIS — E1051 Type 1 diabetes mellitus with diabetic peripheral angiopathy without gangrene: Secondary | ICD-10-CM | POA: Diagnosis not present

## 2019-12-28 DIAGNOSIS — R0989 Other specified symptoms and signs involving the circulatory and respiratory systems: Secondary | ICD-10-CM | POA: Diagnosis present

## 2019-12-28 DIAGNOSIS — I739 Peripheral vascular disease, unspecified: Secondary | ICD-10-CM

## 2019-12-28 DIAGNOSIS — Z95828 Presence of other vascular implants and grafts: Secondary | ICD-10-CM

## 2019-12-28 DIAGNOSIS — I779 Disorder of arteries and arterioles, unspecified: Secondary | ICD-10-CM

## 2019-12-28 NOTE — Progress Notes (Signed)
Office Note     CC:  follow up Requesting Provider:  Gaynelle Arabian, MD  HPI: Steven Sanders is a 79 y.o. (Mar 21, 1940) male is status post aortobifemoral bypass with Dacron graft on Aug 11, 2013 by Dr. Trula Slade secondary to bilateral claudication. Imaging studies at that time revealed severe iliac disease.  He also has stenosis/occlusion of both superficial femoral arteries: (right: 60-70 %), (left: 40-50%).  He has intermittent calf cramping at night that is relieved with flexion, extension massage.  He denies claudication or rest pain.  He has a regular walking program.  History of bilateral carotid artery stenosis: No previous history of stroke or CVA.  He denies monocular blindness, slurred speech, facial drooping, extremity weakness or confusion.  Compliant with aspirin and statin. Also maintained on cilostazol daily and ACEI. History of DM-insulin requiring. Hgb A1c = 6.4 on 11/01/2019 Former smoker; quit in 2010  Past Medical History:  Diagnosis Date  . COPD (chronic obstructive pulmonary disease) ([place])    stopped smoking 5 years ago  . Diabetes mellitus   . GERD (gastroesophageal reflux disease)   . Hypercholesterolemia   . Hypertension   . Memory difficulty 09/06/2018  . Peripheral vascular disease [facility])     Past Surgical History:  Procedure Laterality Date  . AORTA - BILATERAL FEMORAL ARTERY BYPASS GRAFT N/A 08/11/2013   Procedure: AORTA BIFEMORAL BYPASS GRAFT;  Surgeon: Serafina Mitchell, MD;  Location: [facility];  Service: Vascular;  Laterality: N/A;  . COLONOSCOPY    . COLONOSCOPY W/ BIOPSIES AND POLYPECTOMY  2008  . HERNIA REPAIR      Social History   Socioeconomic History  . Marital status: Widowed    Spouse name: Not on file  . Number of children: Not on file  . Years of education: Not on file  . Highest education level: Not on file  Occupational History  . Occupation: Retired  Tobacco Use  . Smoking status: Former Smoker    Packs/day: 1.00    Years:  50.00    Pack years: 50.00    Types: Cigarettes    Quit date: 01/14/2009    Years since quitting: 10.9  . Smokeless tobacco: Never Used  Vaping Use  . Vaping Use: Never used  Substance and Sexual Activity  . Alcohol use: Yes    Alcohol/week: 9.0 standard drinks    Types: 3 Glasses of wine, 3 Cans of beer, 3 Shots of liquor per week  . Drug use: No  . Sexual activity: Not on file  Other Topics Concern  . Not on file  Social History Narrative   Right Handed    Lives at home alone   Caffeine 5 cups daily   Social Determinants of Health   Financial Resource Strain:   . Difficulty of Paying Living Expenses: Not on file  Food Insecurity: No Food Insecurity  . Worried About Charity fundraiser in the Last Year: Never true  . Ran Out of Food in the Last Year: Never true  Transportation Needs: No Transportation Needs  . Lack of Transportation (Medical): No  . Lack of Transportation (Non-Medical): No  Physical Activity:   . Days of Exercise per Week: Not on file  . Minutes of Exercise per Session: Not on file  Stress:   . Feeling of Stress : Not on file  Social Connections:   . Frequency of Communication with Friends and Family: Not on file  . Frequency of Social Gatherings with Friends and Family: Not on  file  . Attends Religious Services: Not on file  . Active Member of Clubs or Organizations: Not on file  . Attends Archivist Meetings: Not on file  . Marital Status: Not on file  Intimate Partner Violence:   . Fear of Current or Ex-Partner: Not on file  . Emotionally Abused: Not on file  . Physically Abused: Not on file  . Sexually Abused: Not on file   Family History  Problem Relation Age of Onset  . Hyperlipidemia Mother   . Cancer Mother   . Hyperlipidemia Father   . Cancer Father   . Diabetes Neg Hx     Current Outpatient Medications  Medication Sig Dispense Refill  . Accu-Chek Softclix Lancets lancets 1 each by Other route 4 (four) times daily. Use to  monitor glucose levels four times daily; E10.51 100 each 3  . Alcohol Swabs (B-D SINGLE USE SWABS REGULAR) PADS 1 each by Does not apply route 4 (four) times daily. Use to cleanse site prior to obtaining droplet for glucose monitoring; E10.51 100 each 3  . aspirin 81 MG tablet Take 81 mg by mouth daily. Reported on 04/22/2015    . Blood Glucose Calibration (ACCU-CHEK AVIVA) SOLN 1 each by In Vitro route as needed. Use to calibrate meter prn; E10.51 1 each 0  . Blood Glucose Monitoring Suppl (ACCU-CHEK AVIVA PLUS) w/Device KIT 1 each by Other route 4 (four) times daily. Use to monitor glucose levels four times daily; E10.51 1 kit 0  . cilostazol (PLETAL) 100 MG tablet TAKE 1 TABLET TWICE DAILY BEFORE MEALS 180 tablet 3  . Continuous Blood Gluc Sensor (DEXCOM G6 SENSOR) MISC 1 each by Other route See admin instructions. Change sensor every 10 days 9 each prn  . Continuous Blood Gluc Transmit (DEXCOM G6 TRANSMITTER) MISC 1 each by Other route every 3 (three) months. 1 each prn  . donepezil (ARICEPT) 5 MG tablet TAKE 1 TABLET BY MOUTH EVERYDAY AT BEDTIME 30 tablet 1  . DROPLET INSULIN SYRINGE 31G X 5/16" 0.5 ML MISC USE TO INJECT INSULIN 2 TIMES PER DAY 200 each 2  . glucose blood (ACCU-CHEK AVIVA PLUS) test strip 1 each by Other route 4 (four) times daily. Use to monitor glucose levels four times daily; E10.51 100 each 3  . insulin NPH-regular Human (NOVOLIN 70/30) (70-30) 100 UNIT/ML injection Inject 28 Units into the skin daily with breakfast. 10 mL 11  . Insulin Syringe-Needle U-100 (BD SAFETYGLIDE INSULIN SYRINGE) 31G X 15/64" 0.5 ML MISC 1 each by Does not apply route 2 (two) times daily. 180 each 2  . lisinopril (PRINIVIL,ZESTRIL) 5 MG tablet Take 5 mg by mouth daily.    . pantoprazole (PROTONIX) 40 MG tablet     . rosuvastatin (CRESTOR) 40 MG tablet Take 40 mg by mouth daily.     No current facility-administered medications for this visit.    Allergies  Allergen Reactions  . Neomycin Other  (See Comments)    unknown  . Tea Tree Oil Other (See Comments)    unknown  . Simvastatin   . Sulfa Antibiotics Other (See Comments)    unknown  . Other Rash    Chlorhexidine gluconate     REVIEW OF SYSTEMS:   '[X]'$  denotes positive finding, $RemoveBeforeDEI'[ ]'[id number]$  denotes negative finding Cardiac  Comments:  Chest pain or chest pressure:    Shortness of breath upon exertion:    Short of breath when lying flat:    Irregular heart rhythm:  Vascular    Pain in calf, thigh, or hip brought on by ambulation:    Pain in feet at night that wakes you up from your sleep:     Blood clot in your veins:    Leg swelling:         Pulmonary    Oxygen at home:    Productive cough:     Wheezing:         Neurologic    Sudden weakness in arms or legs:     Sudden numbness in arms or legs:     Sudden onset of difficulty speaking or slurred speech:    Temporary loss of vision in one eye:     Problems with dizziness:         Gastrointestinal    Blood in stool:     Vomited blood:         Genitourinary    Burning when urinating:     Blood in urine:        Psychiatric    Major depression:         Hematologic    Bleeding problems:    Problems with blood clotting too easily:        Skin    Rashes or ulcers:        Constitutional    Fever or chills:      PHYSICAL EXAMINATION:  Vitals:   12/28/19 1006 12/28/19 1008  BP: (!) 152/63 135/64  Pulse: 63   Resp: 20   Temp: 98.3 F (36.8 C)   SpO2: 100%    General:  WDWN in NAD; vital signs documented above Gait: Unaided, no ataxia HENT: WNL, normocephalic Pulmonary: normal non-labored breathing , without Rales, rhonchi,  wheezing Cardiac: regular HR, without  Murmurs without carotid bruit Abdomen: soft, NT, no masses Skin: without rashes Vascular Exam/Pulses: The patient has 2+ brachial, radial and femoral pulses bilaterally.  Unable to palpate pedal pulses. Extremities: without ischemic changes, without Gangrene , without cellulitis;  without open wounds; both feet are warm and well perfused. Musculoskeletal: no muscle wasting or atrophy  Neurologic: A&O X 3;  No focal weakness or paresthesias are detected Psychiatric:  The pt has Normal affect.   Non-Invasive Vascular Imaging:   12/28/2019: LE arterial duplex; Right: 50-74% stenosis noted in the deep femoral artery. Maximum PS velocity of SFA = 240 cm/sec in the proximal segment.  Left: 30-49% stenosis noted in the deep femoral artery. 50-74% stenosis  noted in the popliteal artery.   All waveforms are biphasic bilaterally.   BLE ABIs: ABI/TBIToday's ABIToday's TBIPrevious ABIPrevious TBI  +-------+-----------+-----------+------------+------------+  Right 1.09    0.46    0.76    0.45      +-------+-----------+-----------+------------+------------+  Left  0.97    0.58    0.69    0.34      +-------+-----------+-----------+------------+------------+ Right TP = 59. Biphasic waveforms (previous TP = 59) Left TP = 75. Biphasic waveforms (previous TP = 44)  Duplex aortobifem graft: Patent Aorto Bifemoral bypass graft with no evidence of stenosis. All waveforms are biphasic.  Bilateral carotid duplex: Right Carotid: Velocities in the right ICA are consistent with a 1-39%  stenosis.  Left Carotid: Velocities in the left ICA are consistent with a 40-59%  stenosis.  Bilateral vertebral artery flow is antegrade and bilateral subclavian waveforms are multiphasic.  ASSESSMENT/PLAN:: 79 y.o. male here for follow up for peripheral arterial disease status post aortobifemoral bypass.  His ABIs have improved over the past  2 years and are within normal limits.  Toe pressures on the right are stable and he has had great improvement in the left toe pressure.  He has no symptoms of claudication or tissue loss.  Encouraged him to continue his routine walking program, smoking cessation, glucose control and medication  compliance.  History of bilateral carotid artery stenosis.  In the approximation of stenosis of the left ICA as compared to duplex performed in 2018.  He is asymptomatic.  We reviewed the signs and symptoms of stroke/TIA and advised him to seek immediate attention should these occur.  Follow-up in 1 year with repeat duplex studies of the carotid arteries bilaterally, lower extremities, aortoiliac as well as ABIs.  Barbie Banner, PA-C [facility] [phone]  Clinic MD:   Scot Dock

## 2020-01-01 ENCOUNTER — Telehealth: Payer: Self-pay

## 2020-01-01 ENCOUNTER — Other Ambulatory Visit: Payer: Self-pay

## 2020-01-01 ENCOUNTER — Other Ambulatory Visit: Payer: Self-pay | Admitting: Endocrinology

## 2020-01-01 DIAGNOSIS — E1051 Type 1 diabetes mellitus with diabetic peripheral angiopathy without gangrene: Secondary | ICD-10-CM

## 2020-01-01 MED ORDER — DEXCOM G6 TRANSMITTER MISC: 1.0000 | 99 refills | Status: DC

## 2020-01-01 MED ORDER — DEXCOM G6 RECEIVER DEVI: 1 refills | Status: DC

## 2020-01-01 MED ORDER — DEXCOM G6 SENSOR MISC: 1.0000 | 3 refills | Status: DC

## 2020-01-01 NOTE — Telephone Encounter (Signed)
Receiver sent to pharmacy

## 2020-01-01 NOTE — Telephone Encounter (Signed)
[employer] has received your information, and the request will be reviewed. You may close this dialog, return to your dashboard, and perform other tasks.  You will receive an electronic determination in CoverMyMeds. You can see the latest determination by locating this request on your dashboard or by reopening this request. You will also receive a faxed copy of the determination. If you have any questions please contact [employer] at [phone].  If you need assistance, please chat with CoverMyMeds or call us at [phone]  [facility] reciever  Key: [id number] - PA Case ID: [id number] Need help? Call us at [phone] Status Sent to [employer] [employer] Dexcom G6 Receiver device Form [employer] 4-Part Electronic PA Form [phone] NCPDP)

## 2020-01-01 NOTE — Telephone Encounter (Signed)
Patient requests RX's for Dexcom G6 Reader along with all Dexcom G6 supplies be sent to: [id number] [place], [place] - [address] Phone:  [phone]     Patient requests to be called at ph# [phone] once the above RX's have been sent to the above PHARM so that patient can go to [facility] to pick up (Patient's Dexcom G6 is broken)

## 2020-01-03 ENCOUNTER — Ambulatory Visit: Payer: HMO | Admitting: Endocrinology

## 2020-01-03 DIAGNOSIS — E1051 Type 1 diabetes mellitus with diabetic peripheral angiopathy without gangrene: Secondary | ICD-10-CM

## 2020-01-04 ENCOUNTER — Ambulatory Visit: Payer: HMO | Admitting: Endocrinology

## 2020-01-04 DIAGNOSIS — E1051 Type 1 diabetes mellitus with diabetic peripheral angiopathy without gangrene: Secondary | ICD-10-CM

## 2020-01-23 ENCOUNTER — Other Ambulatory Visit: Payer: Self-pay

## 2020-01-23 NOTE — Patient Outreach (Signed)
  [facility]) Care Management Chronic Special Needs Program    01/23/2020  Name: Steven Sanders, DOB: [birth year]  MRN: [id number]   [facility] Care Management will continue to provide services for this member through 03/15/20.  The [employer] care management team will assume care 03/16/2020.   George Ina RN,BSN,CCM Chronic Care Management Coordinator [facility] Care Management [phone]

## 2020-03-19 ENCOUNTER — Other Ambulatory Visit: Payer: Self-pay

## 2020-03-22 DIAGNOSIS — E13319 Other specified diabetes mellitus with unspecified diabetic retinopathy without macular edema: Secondary | ICD-10-CM | POA: Diagnosis not present

## 2020-03-22 DIAGNOSIS — H35033 Hypertensive retinopathy, bilateral: Secondary | ICD-10-CM | POA: Diagnosis not present

## 2020-03-22 DIAGNOSIS — E7800 Pure hypercholesterolemia, unspecified: Secondary | ICD-10-CM | POA: Diagnosis not present

## 2020-03-22 DIAGNOSIS — E1149 Type 2 diabetes mellitus with other diabetic neurological complication: Secondary | ICD-10-CM | POA: Diagnosis not present

## 2020-03-22 DIAGNOSIS — K219 Gastro-esophageal reflux disease without esophagitis: Secondary | ICD-10-CM | POA: Diagnosis not present

## 2020-03-22 DIAGNOSIS — E1129 Type 2 diabetes mellitus with other diabetic kidney complication: Secondary | ICD-10-CM | POA: Diagnosis not present

## 2020-03-22 DIAGNOSIS — E1139 Type 2 diabetes mellitus with other diabetic ophthalmic complication: Secondary | ICD-10-CM | POA: Diagnosis not present

## 2020-04-15 ENCOUNTER — Telehealth: Payer: Self-pay | Admitting: *Deleted

## 2020-04-15 NOTE — Telephone Encounter (Signed)
[facility] Pt past due for appt and medication was last prescribed in 2020. I spoke with the pt and was able to schedule him for a follow up with Dr Anne Hahn this Wed 2/2 at 2 pm, arrival 130. He declined to see the NP. Pt's questions were answered. He verbalized appreciation for the call.

## 2020-04-17 ENCOUNTER — Telehealth: Payer: Self-pay | Admitting: Neurology

## 2020-04-17 ENCOUNTER — Encounter: Payer: Self-pay | Admitting: Neurology

## 2020-04-17 ENCOUNTER — Ambulatory Visit: Payer: Self-pay | Admitting: Neurology

## 2020-04-17 NOTE — Telephone Encounter (Signed)
This patient did not show for a revisit appointment today. 

## 2020-04-19 DIAGNOSIS — K219 Gastro-esophageal reflux disease without esophagitis: Secondary | ICD-10-CM | POA: Diagnosis not present

## 2020-04-19 DIAGNOSIS — E1149 Type 2 diabetes mellitus with other diabetic neurological complication: Secondary | ICD-10-CM | POA: Diagnosis not present

## 2020-04-19 DIAGNOSIS — E7800 Pure hypercholesterolemia, unspecified: Secondary | ICD-10-CM | POA: Diagnosis not present

## 2020-04-19 DIAGNOSIS — E1129 Type 2 diabetes mellitus with other diabetic kidney complication: Secondary | ICD-10-CM | POA: Diagnosis not present

## 2020-04-19 DIAGNOSIS — E1139 Type 2 diabetes mellitus with other diabetic ophthalmic complication: Secondary | ICD-10-CM | POA: Diagnosis not present

## 2020-04-19 DIAGNOSIS — H35033 Hypertensive retinopathy, bilateral: Secondary | ICD-10-CM | POA: Diagnosis not present

## 2020-04-19 DIAGNOSIS — E13319 Other specified diabetes mellitus with unspecified diabetic retinopathy without macular edema: Secondary | ICD-10-CM | POA: Diagnosis not present

## 2020-04-22 ENCOUNTER — Other Ambulatory Visit: Payer: Self-pay

## 2020-04-24 ENCOUNTER — Ambulatory Visit: Payer: HMO | Admitting: Endocrinology

## 2020-04-24 DIAGNOSIS — H2513 Age-related nuclear cataract, bilateral: Secondary | ICD-10-CM | POA: Diagnosis not present

## 2020-04-24 DIAGNOSIS — H40013 Open angle with borderline findings, low risk, bilateral: Secondary | ICD-10-CM | POA: Diagnosis not present

## 2020-04-24 DIAGNOSIS — H25013 Cortical age-related cataract, bilateral: Secondary | ICD-10-CM | POA: Diagnosis not present

## 2020-04-24 DIAGNOSIS — H40033 Anatomical narrow angle, bilateral: Secondary | ICD-10-CM | POA: Diagnosis not present

## 2020-04-24 DIAGNOSIS — E103293 Type 1 diabetes mellitus with mild nonproliferative diabetic retinopathy without macular edema, bilateral: Secondary | ICD-10-CM | POA: Diagnosis not present

## 2020-04-29 ENCOUNTER — Telehealth: Payer: Self-pay | Admitting: Endocrinology

## 2020-04-29 NOTE — Telephone Encounter (Signed)
His dexcom needs a prior authorization first.

## 2020-04-29 NOTE — Telephone Encounter (Signed)
Pt called to request a prescription for his sensor and transmitter be sent to the pharmacy so he can get an idea of how much they are going to be.   [facility] [address] ([address]), [place] - [address] Phone:  [phone]  Fax:  [phone]

## 2020-05-03 ENCOUNTER — Telehealth: Payer: Self-pay | Admitting: Endocrinology

## 2020-05-03 NOTE — Telephone Encounter (Signed)
1.  Please schedule f/u appt 2.  Then i'll do form, pending that appt.

## 2020-05-08 DIAGNOSIS — M792 Neuralgia and neuritis, unspecified: Secondary | ICD-10-CM | POA: Diagnosis not present

## 2020-05-08 DIAGNOSIS — M2041 Other hammer toe(s) (acquired), right foot: Secondary | ICD-10-CM | POA: Diagnosis not present

## 2020-05-08 DIAGNOSIS — E1151 Type 2 diabetes mellitus with diabetic peripheral angiopathy without gangrene: Secondary | ICD-10-CM | POA: Diagnosis not present

## 2020-05-08 DIAGNOSIS — I739 Peripheral vascular disease, unspecified: Secondary | ICD-10-CM | POA: Diagnosis not present

## 2020-05-08 DIAGNOSIS — M2042 Other hammer toe(s) (acquired), left foot: Secondary | ICD-10-CM | POA: Diagnosis not present

## 2020-05-13 NOTE — Telephone Encounter (Signed)
[employer] called asking to please complete the form and send back to them.   appt set for 3/31

## 2020-05-21 ENCOUNTER — Other Ambulatory Visit: Payer: Self-pay

## 2020-05-21 DIAGNOSIS — Z8679 Personal history of other diseases of the circulatory system: Secondary | ICD-10-CM

## 2020-05-21 DIAGNOSIS — Z87891 Personal history of nicotine dependence: Secondary | ICD-10-CM

## 2020-05-21 DIAGNOSIS — Z95828 Presence of other vascular implants and grafts: Secondary | ICD-10-CM

## 2020-05-21 DIAGNOSIS — R0989 Other specified symptoms and signs involving the circulatory and respiratory systems: Secondary | ICD-10-CM

## 2020-05-21 MED ORDER — CILOSTAZOL 100 MG PO TABS: ORAL_TABLET | ORAL | 3 refills | Status: DC

## 2020-06-13 ENCOUNTER — Other Ambulatory Visit: Payer: Self-pay

## 2020-06-13 ENCOUNTER — Ambulatory Visit: Payer: Medicare HMO | Admitting: Endocrinology

## 2020-06-13 VITALS — BP 132/80 | HR 98 | Ht 66.0 in | Wt 133.2 lb

## 2020-06-13 DIAGNOSIS — E1051 Type 1 diabetes mellitus with diabetic peripheral angiopathy without gangrene: Secondary | ICD-10-CM

## 2020-06-13 DIAGNOSIS — J449 Chronic obstructive pulmonary disease, unspecified: Secondary | ICD-10-CM | POA: Diagnosis not present

## 2020-06-13 LAB — POCT GLYCOSYLATED HEMOGLOBIN (HGB A1C): Hemoglobin A1C: 7 % — AB (ref 4.0–5.6)

## 2020-06-13 MED ORDER — NOVOLIN 70/30 (70-30) 100 UNIT/ML ~~LOC~~ SUSP
27.0000 [IU] | Freq: Every day | SUBCUTANEOUS | 11 refills | Status: DC
Start: 2020-06-13 — End: 2020-09-24

## 2020-06-13 NOTE — Patient Instructions (Addendum)
Please reduce the 70/30 insulin to 27 units each morning, and avoid the regular altogether.  On this type of insulin schedule, you should eat meals on a regular schedule.  If a meal is missed or significantly delayed, your blood sugar could go low.  Please come back for a follow-up appointment in 3 months.

## 2020-06-13 NOTE — Progress Notes (Signed)
Subjective:    Patient ID: Steven Sanders, male    DOB: 01/07/1941, 80 y.o.   MRN: [id number]  HPI Pt returns for f/u of diabetes mellitus: DM type: 1 Dx'ed: 3614 Complications: PN, DN, DR, and PAD.  Therapy: insulin since dx.   DKA: never. Severe hypoglycemia: multiple episodes, most recently in 2015.   Pancreatitis: never.  SDOH: he takes human insulin, due to cost.   Other: he has declined multiple daily injections; he has a dexcom-G6 continuous glucose monitor; in 2020, he chose to d/c pump.   Interval history: I reviewed continuous glucose monitor data, and it is scanned into the record.  Glucose varies from 60-220. It is in general lowest in the afternoon, but there is little trend throughout the day.  He still takes PRN regular insulin.   Past Medical History:  Diagnosis Date  . COPD (chronic obstructive pulmonary disease) ([place])    stopped smoking 5 years ago  . Diabetes mellitus   . GERD (gastroesophageal reflux disease)   . Hypercholesterolemia   . Hypertension   . Memory difficulty 09/06/2018  . Peripheral vascular disease [facility])     Past Surgical History:  Procedure Laterality Date  . AORTA - BILATERAL FEMORAL ARTERY BYPASS GRAFT N/A 08/11/2013   Procedure: AORTA BIFEMORAL BYPASS GRAFT;  Surgeon: Serafina Mitchell, MD;  Location: [place];  Service: Vascular;  Laterality: N/A;  . COLONOSCOPY    . COLONOSCOPY W/ BIOPSIES AND POLYPECTOMY  2008  . HERNIA REPAIR      Social History   Socioeconomic History  . Marital status: Widowed    Spouse name: Not on file  . Number of children: Not on file  . Years of education: Not on file  . Highest education level: Not on file  Occupational History  . Occupation: Retired  Tobacco Use  . Smoking status: Former Smoker    Packs/day: 1.00    Years: 50.00    Pack years: 50.00    Types: Cigarettes    Quit date: 01/14/2009    Years since quitting: 11.4  . Smokeless tobacco: Never Used  Vaping Use  . Vaping Use: Never used   Substance and Sexual Activity  . Alcohol use: Yes    Alcohol/week: 9.0 standard drinks    Types: 3 Glasses of wine, 3 Cans of beer, 3 Shots of liquor per week  . Drug use: No  . Sexual activity: Not on file  Other Topics Concern  . Not on file  Social History Narrative   Right Handed    Lives at home alone   Caffeine 5 cups daily   Social Determinants of Health   Financial Resource Strain: Not on file  Food Insecurity: No Food Insecurity  . Worried About Charity fundraiser in the Last Year: Never true  . Ran Out of Food in the Last Year: Never true  Transportation Needs: No Transportation Needs  . Lack of Transportation (Medical): No  . Lack of Transportation (Non-Medical): No  Physical Activity: Not on file  Stress: Not on file  Social Connections: Not on file  Intimate Partner Violence: Not on file    Current Outpatient Medications on File Prior to Visit  Medication Sig Dispense Refill  . Accu-Chek Softclix Lancets lancets 1 each by Other route 4 (four) times daily. Use to monitor glucose levels four times daily; E10.51 100 each 3  . Alcohol Swabs (B-D SINGLE USE SWABS REGULAR) PADS 1 each by Does not apply route 4 (  four) times daily. Use to cleanse site prior to obtaining droplet for glucose monitoring; E10.51 100 each 3  . aspirin 81 MG tablet Take 81 mg by mouth daily. Reported on 04/22/2015    . Blood Glucose Calibration (ACCU-CHEK AVIVA) SOLN 1 each by In Vitro route as needed. Use to calibrate meter prn; E10.51 1 each 0  . Blood Glucose Monitoring Suppl (ACCU-CHEK AVIVA PLUS) w/Device KIT 1 each by Other route 4 (four) times daily. Use to monitor glucose levels four times daily; E10.51 1 kit 0  . cilostazol (PLETAL) 100 MG tablet TAKE 1 TABLET TWICE DAILY BEFORE MEALS 180 tablet 3  . Continuous Blood Gluc Receiver (DEXCOM G6 RECEIVER) DEVI Use as directed or needed 1 each 1  . Continuous Blood Gluc Sensor (DEXCOM G6 SENSOR) MISC 1 each by Other route See admin  instructions. Change sensor every 10 days DX E11.9 8 each 3  . Continuous Blood Gluc Transmit (DEXCOM G6 TRANSMITTER) MISC 1 each by Other route every 3 (three) months. DX E11.9 1 each prn  . donepezil (ARICEPT) 5 MG tablet TAKE 1 TABLET BY MOUTH EVERYDAY AT BEDTIME 30 tablet 1  . DROPLET INSULIN SYRINGE 31G X 5/16" 0.5 ML MISC USE TO INJECT INSULIN 2 TIMES PER DAY 200 each 2  . glucose blood (ACCU-CHEK AVIVA PLUS) test strip 1 each by Other route 4 (four) times daily. Use to monitor glucose levels four times daily; E10.51 100 each 3  . Insulin Syringe-Needle U-100 (BD SAFETYGLIDE INSULIN SYRINGE) 31G X 15/64" 0.5 ML MISC 1 each by Does not apply route 2 (two) times daily. 180 each 2  . lisinopril (PRINIVIL,ZESTRIL) 5 MG tablet Take 5 mg by mouth daily.    . pantoprazole (PROTONIX) 40 MG tablet     . rosuvastatin (CRESTOR) 40 MG tablet Take 40 mg by mouth daily.     No current facility-administered medications on file prior to visit.    Allergies  Allergen Reactions  . Neomycin Other (See Comments)    unknown  . Tea Tree Oil Other (See Comments)    unknown  . Simvastatin   . Sulfa Antibiotics Other (See Comments)    unknown  . Other Rash    Chlorhexidine gluconate    Family History  Problem Relation Age of Onset  . Hyperlipidemia Mother   . Cancer Mother   . Hyperlipidemia Father   . Cancer Father   . Diabetes Neg Hx     BP 132/80 (BP Location: Right Arm, Patient Position: Sitting, Cuff Size: Normal)   Pulse 98   Ht 5\' 6"  (1.676 m)   Wt 133 lb 3.2 oz (60.4 kg)   SpO2 99%   BMI 21.50 kg/m    Review of Systems     Objective:   Physical Exam VITAL SIGNS:  See vs page GENERAL: no distress Pulses: dorsalis pedis intact bilat.   MSK: no deformity of the feet CV: no leg edema, but there are bilat vv's.  Skin:  no ulcer on the feet.  normal color and temp on the feet. Neuro: sensation is intact to touch on the feet.  Lab Results  Component Value Date   HGBA1C 7.0  (A) 06/13/2020        Assessment & Plan:  Type 1 DM, with PAD Hypoglycemia, due to insulin: this limits aggressiveness of glycemic control.     Patient Instructions  Please reduce the 70/30 insulin to 27 units each morning, and avoid the regular altogether.  On this  type of insulin schedule, you should eat meals on a regular schedule.  If a meal is missed or significantly delayed, your blood sugar could go low.  Please come back for a follow-up appointment in 3 months.

## 2020-06-20 ENCOUNTER — Telehealth: Payer: Self-pay

## 2020-06-20 NOTE — Telephone Encounter (Signed)
Please contact pt to schedule a F/U appt per ellison  Thank you,  Christy Gentles

## 2020-06-24 ENCOUNTER — Other Ambulatory Visit: Payer: Self-pay

## 2020-06-24 ENCOUNTER — Telehealth: Payer: Self-pay | Admitting: Endocrinology

## 2020-06-24 DIAGNOSIS — E1051 Type 1 diabetes mellitus with diabetic peripheral angiopathy without gangrene: Secondary | ICD-10-CM

## 2020-06-24 MED ORDER — ACCU-CHEK AVIVA PLUS W/DEVICE KIT: 1.0000 | PACK | Freq: Four times a day (QID) | 0 refills | Status: AC

## 2020-06-24 MED ORDER — ACCU-CHEK AVIVA PLUS W/DEVICE KIT: 1.0000 | PACK | Freq: Four times a day (QID) | 0 refills | Status: DC

## 2020-06-24 NOTE — Telephone Encounter (Signed)
MEDICATION: accu-check sensors and transmitters  PHARMACY:   [id number] - [place], [place] - [address] Phone:  [phone]  Fax:  [phone]     [id number] [address] [place]), [place] - [address] Phone:  [phone]  Fax:  [phone]     ---- Pt requests they be sent to both pharmacys so that he can see which one is cheaper and pick them up there. -----  HAS THE PATIENT CONTACTED THEIR PHARMACY?  no  IS THIS A 90 DAY SUPPLY :  Pt doesn't know  IS PATIENT OUT OF MEDICATION: yes  IF NOT; HOW MUCH IS LEFT:   LAST APPOINTMENT DATE: @4 /09/2020  NEXT APPOINTMENT DATE:@6 /30/2022  DO WE HAVE YOUR PERMISSION TO LEAVE A DETAILED MESSAGE?:  OTHER COMMENTS:    **Let patient know to contact pharmacy at the end of the day to make sure medication is ready. **  ** Please notify patient to allow 48-72 hours to process**

## 2020-06-24 NOTE — Telephone Encounter (Signed)
Will call back to request medication refills, pt didn't have them with him

## 2020-06-25 ENCOUNTER — Telehealth: Payer: Self-pay | Admitting: Nutrition

## 2020-06-25 NOTE — Telephone Encounter (Signed)
Message left that he was having trouble with Dexcom sensors and transmitter.  I called him back and he reported that he figured it out, and put in the sensor and transmitter and it is currently working well.

## 2020-06-27 ENCOUNTER — Other Ambulatory Visit: Payer: Self-pay

## 2020-06-27 ENCOUNTER — Ambulatory Visit: Payer: Medicare HMO | Admitting: Neurology

## 2020-06-27 ENCOUNTER — Encounter: Payer: Self-pay | Admitting: Neurology

## 2020-06-27 VITALS — BP 135/67 | HR 79 | Ht 66.5 in | Wt 135.0 lb

## 2020-06-27 DIAGNOSIS — R413 Other amnesia: Secondary | ICD-10-CM | POA: Diagnosis not present

## 2020-06-27 MED ORDER — DONEPEZIL HCL 5 MG PO TABS: ORAL_TABLET | ORAL | 1 refills | Status: DC

## 2020-06-27 NOTE — Patient Instructions (Signed)
   We will restart Aricept for the memory.   Begin Aricept (donepezil) at 5 mg at night for one month. If this medication is well-tolerated, please call our office and we will call in a prescription for the 10 mg tablets. Look out for side effects that may include nausea, diarrhea, weight loss, or stomach cramps. This medication will also cause a runny nose, therefore there is no need for allergy medications for this purpose.  

## 2020-06-27 NOTE — Progress Notes (Signed)
Reason for visit: Memory disturbance, Alzheimer's disease  Steven Sanders is an 80 y.o. male  History of present illness:  Steven Sanders is a 80 year old right-handed white male with a history of a progressive memory disturbance.  The patient does have diabetes, he has at least a moderate level of small vessel ischemic changes, he was placed on low-dose aspirin.  He has diffuse cortical atrophy including the mesial temporal regions, and likely has an Alzheimer's process.  He was placed on Aricept and last seen, but he never went up on the dose to 10 mg and he apparently took about 4 months of the 5 mg dose of the medication and then stopped the drug.  The patient has not been seen in 2 years.  He comes in with his daughter today.  He missed his last appointment.  The patient lives alone, his daughter is from [place].  The patient is still operating motor vehicle, he is doing his finances, he has not given up any activities of daily living.  His daughter does indicate there has been some progression in the memory issues.  Past Medical History:  Diagnosis Date  . COPD (chronic obstructive pulmonary disease) ([place])    stopped smoking 5 years ago  . Diabetes mellitus   . GERD (gastroesophageal reflux disease)   . Hypercholesterolemia   . Hypertension   . Memory difficulty 09/06/2018  . Peripheral vascular disease [facility])     Past Surgical History:  Procedure Laterality Date  . AORTA - BILATERAL FEMORAL ARTERY BYPASS GRAFT N/A 08/11/2013   Procedure: AORTA BIFEMORAL BYPASS GRAFT;  Surgeon: Serafina Mitchell, MD;  Location: [facility];  Service: Vascular;  Laterality: N/A;  . COLONOSCOPY    . COLONOSCOPY W/ BIOPSIES AND POLYPECTOMY  2008  . HERNIA REPAIR      Family History  Problem Relation Age of Onset  . Hyperlipidemia Mother   . Cancer Mother   . Hyperlipidemia Father   . Cancer Father   . Diabetes Neg Hx     Social history:  reports that he quit smoking about 11 years ago. His  smoking use included cigarettes. He has a 50.00 pack-year smoking history. He has never used smokeless tobacco. He reports current alcohol use of about 7.0 - 14.0 standard drinks of alcohol per week. He reports that he does not use drugs.    Allergies  Allergen Reactions  . Neomycin Other (See Comments)    unknown  . Tea Tree Oil Other (See Comments)    unknown  . Simvastatin   . Sulfa Antibiotics Other (See Comments)    unknown  . Other Rash    Chlorhexidine gluconate    Medications:  Prior to Admission medications   Medication Sig Start Date End Date Taking? Authorizing Provider  Accu-Chek Softclix Lancets lancets 1 each by Other route 4 (four) times daily. Use to monitor glucose levels four times daily; E10.51 10/13/18   Renato Shin, MD  Alcohol Swabs (B-D SINGLE USE SWABS REGULAR) PADS 1 each by Does not apply route 4 (four) times daily. Use to cleanse site prior to obtaining droplet for glucose monitoring; E10.51 10/13/18   Renato Shin, MD  aspirin 81 MG tablet Take 81 mg by mouth daily. Reported on 04/22/2015    [provider]  Blood Glucose Calibration (ACCU-CHEK AVIVA) SOLN 1 each by In Vitro route as needed. Use to calibrate meter prn; E10.51 10/13/18   Renato Shin, MD  Blood Glucose Monitoring Suppl (ACCU-CHEK AVIVA  PLUS) w/Device KIT 1 each by Other route 4 (four) times daily. Use to monitor glucose levels four times daily; E10.51 06/24/20   Renato Shin, MD  cilostazol (PLETAL) 100 MG tablet TAKE 1 TABLET TWICE DAILY BEFORE MEALS 05/21/20   Serafina Mitchell, MD  Continuous Blood Gluc Receiver (Cable) DEVI Use as directed or needed 01/01/20   Renato Shin, MD  Continuous Blood Gluc Sensor (DEXCOM G6 SENSOR) MISC 1 each by Other route See admin instructions. Change sensor every 10 days DX E11.9 01/01/20   Renato Shin, MD  Continuous Blood Gluc Transmit (DEXCOM G6 TRANSMITTER) MISC 1 each by Other route every 3 (three) months. DX E11.9 01/01/20   Renato Shin, MD  donepezil (ARICEPT) 5 MG tablet TAKE 1 TABLET BY MOUTH EVERYDAY AT BEDTIME 11/01/18   Marcial Pacas, MD  DROPLET INSULIN SYRINGE 31G X 5/16" 0.5 ML MISC USE TO INJECT INSULIN 2 TIMES PER DAY 05/09/18   Renato Shin, MD  glucose blood (ACCU-CHEK AVIVA PLUS) test strip 1 each by Other route 4 (four) times daily. Use to monitor glucose levels four times daily; E10.51 10/13/18   Renato Shin, MD  insulin NPH-regular Human (NOVOLIN 70/30) (70-30) 100 UNIT/ML injection Inject 27 Units into the skin daily with breakfast. 06/13/20   Renato Shin, MD  Insulin Syringe-Needle U-100 (BD SAFETYGLIDE INSULIN SYRINGE) 31G X 15/64" 0.5 ML MISC 1 each by Does not apply route 2 (two) times daily. 02/13/19   Renato Shin, MD  lisinopril (PRINIVIL,ZESTRIL) 5 MG tablet Take 5 mg by mouth daily.    [provider]  pantoprazole (PROTONIX) 40 MG tablet  01/22/16   [provider]  rosuvastatin (CRESTOR) 40 MG tablet Take 40 mg by mouth daily.    [provider]    ROS:  Out of a complete 14 system review of symptoms, the patient complains only of the following symptoms, and all other reviewed systems are negative.  Memory problems  Blood pressure 135/67, pulse 79, height 5' 6.5" (1.689 m), weight 135 lb (61.2 kg).  Physical Exam  General: The patient is alert and cooperative at the time of the examination.  Skin: No significant peripheral edema is noted.   Neurologic Exam  Mental status: The patient is alert and oriented x 3 at the time of the examination. The Mini-Mental status examination done today shows a total score 21/30.   Cranial nerves: Facial symmetry is present. Speech is normal, no aphasia or dysarthria is noted. Extraocular movements are full. Visual fields are full.  Motor: The patient has good strength in all 4 extremities.  Sensory examination: Soft touch sensation is symmetric on the face, arms, and legs.  Coordination: The patient has good  finger-nose-finger and heel-to-shin bilaterally.  No apraxia was seen.  Gait and station: The patient has a normal gait. Tandem gait is unsteady. Romberg is negative. No drift is seen.  Reflexes: Deep tendon reflexes are symmetric.   Assessment/Plan:  1.  Progressive memory disturbance, probable Alzheimer's disease  2.  Small vessel disease by MRI brain  The patient will be placed back on Aricept, and he will contact me in 1 month if he is doing well on the medication and we will convert him to the 10 mg tablets.  He will follow up in 6 months.  I indicated to the daughter that his driving needs to be scrutinized closely, he should not be driving if he begins to have safety issues or if he is getting lost.  The need to plan for the future, he has no family living close to this area, he may need at some point to move to [place] to be close to family.  Jill Alexanders MD 06/27/2020 8:12 AM  [facility] [address] [place], [place] [address]  Phone [phone] Fax [phone]

## 2020-07-15 ENCOUNTER — Telehealth: Payer: Self-pay | Admitting: Neurology

## 2020-07-15 MED ORDER — DONEPEZIL HCL 10 MG PO TABS: 10.0000 mg | ORAL_TABLET | Freq: Every day | ORAL | 1 refills | Status: DC

## 2020-07-15 NOTE — Telephone Encounter (Signed)
Pt called wanting to inform the provider that he has not suffered any side affects with donepezil (ARICEPT) 5 MG tablet and would like to know if a prescription can be sent to the [facility] on [address].  Pt would like to be informed when it is called in so that he can go pick it up.

## 2020-07-15 NOTE — Telephone Encounter (Signed)
I called the patient.  The patient will finish out the full 4 weeks before picking up the prescription for the 10 mg Aricept tablet.  He appears to be doing fairly well after 3 weeks on the 5 mg.

## 2020-07-17 DIAGNOSIS — E1139 Type 2 diabetes mellitus with other diabetic ophthalmic complication: Secondary | ICD-10-CM | POA: Diagnosis not present

## 2020-07-17 DIAGNOSIS — H35033 Hypertensive retinopathy, bilateral: Secondary | ICD-10-CM | POA: Diagnosis not present

## 2020-07-17 DIAGNOSIS — K219 Gastro-esophageal reflux disease without esophagitis: Secondary | ICD-10-CM | POA: Diagnosis not present

## 2020-07-17 DIAGNOSIS — E7800 Pure hypercholesterolemia, unspecified: Secondary | ICD-10-CM | POA: Diagnosis not present

## 2020-07-17 DIAGNOSIS — E1149 Type 2 diabetes mellitus with other diabetic neurological complication: Secondary | ICD-10-CM | POA: Diagnosis not present

## 2020-07-17 DIAGNOSIS — E13319 Other specified diabetes mellitus with unspecified diabetic retinopathy without macular edema: Secondary | ICD-10-CM | POA: Diagnosis not present

## 2020-07-17 DIAGNOSIS — E1129 Type 2 diabetes mellitus with other diabetic kidney complication: Secondary | ICD-10-CM | POA: Diagnosis not present

## 2020-07-19 ENCOUNTER — Other Ambulatory Visit: Payer: Self-pay | Admitting: Neurology

## 2020-08-07 DIAGNOSIS — B351 Tinea unguium: Secondary | ICD-10-CM | POA: Diagnosis not present

## 2020-08-07 DIAGNOSIS — I739 Peripheral vascular disease, unspecified: Secondary | ICD-10-CM | POA: Diagnosis not present

## 2020-08-29 DIAGNOSIS — I70221 Atherosclerosis of native arteries of extremities with rest pain, right leg: Secondary | ICD-10-CM | POA: Diagnosis not present

## 2020-08-29 DIAGNOSIS — I70203 Unspecified atherosclerosis of native arteries of extremities, bilateral legs: Secondary | ICD-10-CM | POA: Diagnosis not present

## 2020-08-29 DIAGNOSIS — I739 Peripheral vascular disease, unspecified: Secondary | ICD-10-CM | POA: Diagnosis not present

## 2020-09-06 DIAGNOSIS — E7800 Pure hypercholesterolemia, unspecified: Secondary | ICD-10-CM | POA: Diagnosis not present

## 2020-09-06 DIAGNOSIS — E1139 Type 2 diabetes mellitus with other diabetic ophthalmic complication: Secondary | ICD-10-CM | POA: Diagnosis not present

## 2020-09-06 DIAGNOSIS — E1129 Type 2 diabetes mellitus with other diabetic kidney complication: Secondary | ICD-10-CM | POA: Diagnosis not present

## 2020-09-06 DIAGNOSIS — E13319 Other specified diabetes mellitus with unspecified diabetic retinopathy without macular edema: Secondary | ICD-10-CM | POA: Diagnosis not present

## 2020-09-06 DIAGNOSIS — K219 Gastro-esophageal reflux disease without esophagitis: Secondary | ICD-10-CM | POA: Diagnosis not present

## 2020-09-06 DIAGNOSIS — H35033 Hypertensive retinopathy, bilateral: Secondary | ICD-10-CM | POA: Diagnosis not present

## 2020-09-06 DIAGNOSIS — E1149 Type 2 diabetes mellitus with other diabetic neurological complication: Secondary | ICD-10-CM | POA: Diagnosis not present

## 2020-09-12 ENCOUNTER — Ambulatory Visit (INDEPENDENT_AMBULATORY_CARE_PROVIDER_SITE_OTHER): Payer: Medicare HMO | Admitting: Endocrinology

## 2020-09-12 ENCOUNTER — Other Ambulatory Visit: Payer: Self-pay

## 2020-09-12 VITALS — BP 130/60 | HR 78 | Ht 66.5 in | Wt 133.0 lb

## 2020-09-12 DIAGNOSIS — E1051 Type 1 diabetes mellitus with diabetic peripheral angiopathy without gangrene: Secondary | ICD-10-CM

## 2020-09-12 LAB — POCT GLYCOSYLATED HEMOGLOBIN (HGB A1C): Hemoglobin A1C: 6.9 % — AB (ref 4.0–5.6)

## 2020-09-12 NOTE — Patient Instructions (Addendum)
Please reduce the 70/30 insulin to 27 units each morning, and avoid the regular altogether.  On this type of insulin schedule, you should eat meals on a regular schedule.  If a meal is missed or significantly delayed, your blood sugar could go low.   Please come back for a follow-up appointment in 3 months.

## 2020-09-12 NOTE — Progress Notes (Signed)
Subjective:    Patient ID: Steven Sanders, male    DOB: 1940/04/15, 80 y.o.   MRN: [id number]  HPI Pt returns for f/u of diabetes mellitus: DM type: 1 Dx'ed: 1960 Complications: PN, DN, DR, and PAD.  Therapy: insulin since dx.   DKA: never. Severe hypoglycemia: multiple episodes, most recently in 2015.   Pancreatitis: never.  SDOH: he takes human insulin, due to cost.   Other: he has declined multiple daily injections; he has a dexcom-G6 continuous glucose monitor; in 2020, he chose to d/c pump; he eats meals at 12N and 7PM.  Interval history: I reviewed continuous glucose monitor data, and it is scanned into the record.  Glucose varies from 55-250. It decreases overnight.  is in general lowest in the afternoon, and highest at [facility] and 1-4PM.  He says 70/30 insulin is 30 units qam, and he still takes PRN regular insulin.   Past Medical History:  Diagnosis Date   COPD (chronic obstructive pulmonary disease) ([facility])    stopped smoking 5 years ago   Diabetes mellitus    GERD (gastroesophageal reflux disease)    Hypercholesterolemia    Hypertension    Memory difficulty 09/06/2018   Peripheral vascular disease ([facility])     Past Surgical History:  Procedure Laterality Date   AORTA - BILATERAL FEMORAL ARTERY BYPASS GRAFT N/A 08/11/2013   Procedure: AORTA BIFEMORAL BYPASS GRAFT;  Surgeon: Nada Libman, MD;  Location: [facility];  Service: Vascular;  Laterality: N/A;   COLONOSCOPY     COLONOSCOPY W/ BIOPSIES AND POLYPECTOMY  2008   HERNIA REPAIR      Social History   Socioeconomic History   Marital status: Widowed    Spouse name: Not on file   Number of children: Not on file   Years of education: Not on file   Highest education level: Not on file  Occupational History   Occupation: Retired  Tobacco Use   Smoking status: Former    Packs/day: 1.00    Years: 50.00    Pack years: 50.00    Types: Cigarettes    Quit date: 01/14/2009    Years since quitting: 11.6   Smokeless tobacco:  Never  Vaping Use   Vaping Use: Never used  Substance and Sexual Activity   Alcohol use: Yes    Alcohol/week: 7.0 - 14.0 standard drinks    Types: 7 - 14 Shots of liquor per week    Comment: occasionally beer as well. 1-2 scotch per day   Drug use: No   Sexual activity: Not on file  Other Topics Concern   Not on file  Social History Narrative   Right Handed    Lives at home alone   Caffeine 5 cups daily   Social Determinants of Health   Financial Resource Strain: Not on file  Food Insecurity: No Food Insecurity   Worried About Running Out of Food in the Last Year: Never true   Ran Out of Food in the Last Year: Never true  Transportation Needs: No Transportation Needs   Lack of Transportation (Medical): No   Lack of Transportation (Non-Medical): No  Physical Activity: Not on file  Stress: Not on file  Social Connections: Not on file  Intimate Partner Violence: Not on file    Current Outpatient Medications on File Prior to Visit  Medication Sig Dispense Refill   Accu-Chek Softclix Lancets lancets 1 each by Other route 4 (four) times daily. Use to monitor glucose levels four times daily;  E10.51 100 each 3   Alcohol Swabs (B-D SINGLE USE SWABS REGULAR) PADS 1 each by Does not apply route 4 (four) times daily. Use to cleanse site prior to obtaining droplet for glucose monitoring; E10.51 100 each 3   aspirin 81 MG tablet Take 81 mg by mouth daily. Reported on 04/22/2015     Blood Glucose Calibration (ACCU-CHEK AVIVA) SOLN 1 each by In Vitro route as needed. Use to calibrate meter prn; E10.51 1 each 0   Blood Glucose Monitoring Suppl (ACCU-CHEK AVIVA PLUS) w/Device KIT 1 each by Other route 4 (four) times daily. Use to monitor glucose levels four times daily; E10.51 1 kit 0   cilostazol (PLETAL) 100 MG tablet TAKE 1 TABLET TWICE DAILY BEFORE MEALS 180 tablet 3   Continuous Blood Gluc Receiver (DEXCOM G6 RECEIVER) DEVI Use as directed or needed 1 each 1   Continuous Blood Gluc Sensor  (DEXCOM G6 SENSOR) MISC 1 each by Other route See admin instructions. Change sensor every 10 days DX E11.9 8 each 3   Continuous Blood Gluc Transmit (DEXCOM G6 TRANSMITTER) MISC 1 each by Other route every 3 (three) months. DX E11.9 1 each prn   donepezil (ARICEPT) 10 MG tablet Take 1 tablet (10 mg total) by mouth at bedtime. 90 tablet 1   DROPLET INSULIN SYRINGE 31G X 5/16" 0.5 ML MISC USE TO INJECT INSULIN 2 TIMES PER DAY 200 each 2   glucose blood (ACCU-CHEK AVIVA PLUS) test strip 1 each by Other route 4 (four) times daily. Use to monitor glucose levels four times daily; E10.51 100 each 3   insulin NPH-regular Human (NOVOLIN 70/30) (70-30) 100 UNIT/ML injection Inject 27 Units into the skin daily with breakfast. (Patient taking differently: Inject 30 Units into the skin daily with breakfast.) 10 mL 11   Insulin Syringe-Needle U-100 (BD SAFETYGLIDE INSULIN SYRINGE) 31G X 15/64" 0.5 ML MISC 1 each by Does not apply route 2 (two) times daily. 180 each 2   lisinopril (PRINIVIL,ZESTRIL) 5 MG tablet Take 5 mg by mouth daily.     pantoprazole (PROTONIX) 40 MG tablet      PRESCRIPTION MEDICATION Insulin-R if needed.     rosuvastatin (CRESTOR) 40 MG tablet Take 40 mg by mouth daily.     No current facility-administered medications on file prior to visit.    Allergies  Allergen Reactions   Neomycin Other (See Comments)    unknown   Tea Tree Oil Other (See Comments)    unknown   Simvastatin    Sulfa Antibiotics Other (See Comments)    unknown   Other Rash    Chlorhexidine gluconate    Family History  Problem Relation Age of Onset   Hyperlipidemia Mother    Cancer Mother    Hyperlipidemia Father    Cancer Father    Diabetes Neg Hx     BP 130/60 (BP Location: Right Arm, Patient Position: Sitting, Cuff Size: Normal)   Pulse 78   Ht 5' 6.5" (1.689 m)   Wt 133 lb (60.3 kg)   SpO2 93%   BMI 21.15 kg/m    Review of Systems Denies LOC    Objective:   Physical Exam Pulses: dorsalis  pedis intact bilat.   MSK: no deformity of the feet CV: 1+ leg edema, but there are bilat vv's.  Skin:  no ulcer on the feet.  normal color and temp on the feet.   Neuro: sensation is intact to touch on the feet.    A1c=6.9%  Assessment & Plan:  Type 1 DM: overcontrolled  Patient Instructions  Please reduce the 70/30 insulin to 27 units each morning, and avoid the regular altogether.  On this type of insulin schedule, you should eat meals on a regular schedule.  If a meal is missed or significantly delayed, your blood sugar could go low.   Please come back for a follow-up appointment in 3 months.

## 2020-09-15 ENCOUNTER — Telehealth: Payer: Self-pay | Admitting: Endocrinology

## 2020-09-15 DIAGNOSIS — E1051 Type 1 diabetes mellitus with diabetic peripheral angiopathy without gangrene: Secondary | ICD-10-CM

## 2020-09-16 ENCOUNTER — Ambulatory Visit (HOSPITAL_COMMUNITY)
Admission: EM | Admit: 2020-09-16 | Discharge: 2020-09-16 | Disposition: A | Discharge status: Home / Self Care | Payer: Medicare HMO | Attending: Urgent Care | Admitting: Urgent Care

## 2020-09-16 ENCOUNTER — Other Ambulatory Visit: Payer: Self-pay

## 2020-09-16 ENCOUNTER — Encounter (HOSPITAL_COMMUNITY): Payer: Self-pay

## 2020-09-16 DIAGNOSIS — L2489 Irritant contact dermatitis due to other agents: Secondary | ICD-10-CM

## 2020-09-16 MED ORDER — TRIAMCINOLONE ACETONIDE 0.1 % EX CREA: 1.0000 "application " | TOPICAL_CREAM | Freq: Two times a day (BID) | CUTANEOUS | 0 refills | Status: DC

## 2020-09-16 NOTE — ED Provider Notes (Signed)
[place]   MRN: [id number] DOB: 01-17-1941  Subjective:   Steven Sanders is a 80 y.o. male presenting for 3-day history of acute onset persistent left sided rash over his wrist.  Denies fever, drainage of pus, bleeding, pain.  Patient has the mildest itching.  Has been using Neosporin over the area. Denies eating any new foods, starting new medications, exposure to poisonous plants, new hygiene products, new cleaning products or detergents.  No fever, runny or stuffy nose, cough, malaise, body aches.  No tick bites.  No current facility-administered medications for this encounter.  Current Outpatient Medications:    Accu-Chek Softclix Lancets lancets, 1 each by Other route 4 (four) times daily. Use to monitor glucose levels four times daily; E10.51, Disp: 100 each, Rfl: 3   Alcohol Swabs (B-D SINGLE USE SWABS REGULAR) PADS, 1 each by Does not apply route 4 (four) times daily. Use to cleanse site prior to obtaining droplet for glucose monitoring; E10.51, Disp: 100 each, Rfl: 3   aspirin 81 MG tablet, Take 81 mg by mouth daily. Reported on 04/22/2015, Disp: , Rfl:    Blood Glucose Calibration (ACCU-CHEK AVIVA) SOLN, 1 each by In Vitro route as needed. Use to calibrate meter prn; E10.51, Disp: 1 each, Rfl: 0   Blood Glucose Monitoring Suppl (ACCU-CHEK AVIVA PLUS) w/Device KIT, 1 each by Other route 4 (four) times daily. Use to monitor glucose levels four times daily; E10.51, Disp: 1 kit, Rfl: 0   cilostazol (PLETAL) 100 MG tablet, TAKE 1 TABLET TWICE DAILY BEFORE MEALS, Disp: 180 tablet, Rfl: 3   Continuous Blood Gluc Receiver (DEXCOM G6 RECEIVER) DEVI, Use as directed or needed, Disp: 1 each, Rfl: 1   Continuous Blood Gluc Sensor (DEXCOM G6 SENSOR) MISC, 1 each by Other route See admin instructions. Change sensor every 10 days DX E11.9, Disp: 8 each, Rfl: 3   Continuous Blood Gluc Transmit (DEXCOM G6 TRANSMITTER) MISC, 1 each by Other route every 3 (three) months. DX E11.9,  Disp: 1 each, Rfl: prn   donepezil (ARICEPT) 10 MG tablet, Take 1 tablet (10 mg total) by mouth at bedtime., Disp: 90 tablet, Rfl: 1   DROPLET INSULIN SYRINGE 31G X 5/16" 0.5 ML MISC, USE TO INJECT INSULIN 2 TIMES PER DAY, Disp: 200 each, Rfl: 2   glucose blood (ACCU-CHEK AVIVA PLUS) test strip, 1 each by Other route 4 (four) times daily. Use to monitor glucose levels four times daily; E10.51, Disp: 100 each, Rfl: 3   insulin NPH-regular Human (NOVOLIN 70/30) (70-30) 100 UNIT/ML injection, Inject 27 Units into the skin daily with breakfast. (Patient taking differently: Inject 30 Units into the skin daily with breakfast.), Disp: 10 mL, Rfl: 11   Insulin Syringe-Needle U-100 (BD SAFETYGLIDE INSULIN SYRINGE) 31G X 15/64" 0.5 ML MISC, 1 each by Does not apply route 2 (two) times daily., Disp: 180 each, Rfl: 2   lisinopril (PRINIVIL,ZESTRIL) 5 MG tablet, Take 5 mg by mouth daily., Disp: , Rfl:    pantoprazole (PROTONIX) 40 MG tablet, , Disp: , Rfl:    PRESCRIPTION MEDICATION, Insulin-R if needed., Disp: , Rfl:    rosuvastatin (CRESTOR) 40 MG tablet, Take 40 mg by mouth daily., Disp: , Rfl:    Allergies  Allergen Reactions   Neomycin Other (See Comments)    unknown   Tea Tree Oil Other (See Comments)    unknown   Simvastatin    Sulfa Antibiotics Other (See Comments)    unknown   Other  Rash    Chlorhexidine gluconate    Past Medical History:  Diagnosis Date   COPD (chronic obstructive pulmonary disease) ([place])    stopped smoking 5 years ago   Diabetes mellitus    GERD (gastroesophageal reflux disease)    Hypercholesterolemia    Hypertension    Memory difficulty 09/06/2018   Peripheral vascular disease ([place])      Past Surgical History:  Procedure Laterality Date   AORTA - BILATERAL FEMORAL ARTERY BYPASS GRAFT N/A 08/11/2013   Procedure: AORTA BIFEMORAL BYPASS GRAFT;  Surgeon: Serafina Mitchell, MD;  Location: [facility];  Service: Vascular;  Laterality: N/A;   COLONOSCOPY     COLONOSCOPY W/  BIOPSIES AND POLYPECTOMY  2008   HERNIA REPAIR      Family History  Problem Relation Age of Onset   Hyperlipidemia Mother    Cancer Mother    Hyperlipidemia Father    Cancer Father    Diabetes Neg Hx     Social History   Tobacco Use   Smoking status: Former    Packs/day: 1.00    Years: 50.00    Pack years: 50.00    Types: Cigarettes    Quit date: 01/14/2009    Years since quitting: 11.6   Smokeless tobacco: Never  Vaping Use   Vaping Use: Never used  Substance Use Topics   Alcohol use: Yes    Alcohol/week: 7.0 - 14.0 standard drinks    Types: 7 - 14 Shots of liquor per week    Comment: occasionally beer as well. 1-2 scotch per day   Drug use: No    ROS   Objective:   Vitals: BP (!) 176/59 (BP Location: Right Arm)   Pulse 78   Temp 97.9 F (36.6 C) (Oral)   Resp 18   SpO2 98%   Physical Exam Constitutional:      General: He is not in acute distress.    Appearance: Normal appearance. He is well-developed and normal weight. He is not ill-appearing, toxic-appearing or diaphoretic.  HENT:     Head: Normocephalic and atraumatic.     Right Ear: External ear normal.     Left Ear: External ear normal.     Nose: Nose normal.     Mouth/Throat:     Pharynx: Oropharynx is clear.  Eyes:     General: No scleral icterus.       Right eye: No discharge.        Left eye: No discharge.     Extraocular Movements: Extraocular movements intact.     Pupils: Pupils are equal, round, and reactive to light.  Cardiovascular:     Rate and Rhythm: Normal rate.  Pulmonary:     Effort: Pulmonary effort is normal.  Musculoskeletal:     Cervical back: Normal range of motion.  Skin:      Neurological:     Mental Status: He is alert and oriented to person, place, and time.  Psychiatric:        Mood and Affect: Mood normal.        Behavior: Behavior normal.        Thought Content: Thought content normal.        Judgment: Judgment normal.    Assessment and Plan :   PDMP  not reviewed this encounter.  1. Irritant contact dermatitis due to other agents     Will manage for suspected contact dermatitis to an unknown offending agent.  Recommended triamcinolone cream. Counseled patient on potential  for adverse effects with medications prescribed/recommended today, ER and return-to-clinic precautions discussed, patient verbalized understanding.    Jaynee Eagles, [place] 09/16/20 [phone]

## 2020-09-16 NOTE — ED Triage Notes (Signed)
Pt presnets with rash in left arm x 3 days. States the rash "is not better, not worse" for the past 3 days.

## 2020-09-17 DIAGNOSIS — E11319 Type 2 diabetes mellitus with unspecified diabetic retinopathy without macular edema: Secondary | ICD-10-CM | POA: Diagnosis not present

## 2020-09-17 DIAGNOSIS — I739 Peripheral vascular disease, unspecified: Secondary | ICD-10-CM | POA: Diagnosis not present

## 2020-09-17 DIAGNOSIS — E1142 Type 2 diabetes mellitus with diabetic polyneuropathy: Secondary | ICD-10-CM | POA: Diagnosis not present

## 2020-09-17 DIAGNOSIS — R809 Proteinuria, unspecified: Secondary | ICD-10-CM | POA: Diagnosis not present

## 2020-09-17 DIAGNOSIS — E1139 Type 2 diabetes mellitus with other diabetic ophthalmic complication: Secondary | ICD-10-CM | POA: Diagnosis not present

## 2020-09-17 DIAGNOSIS — G629 Polyneuropathy, unspecified: Secondary | ICD-10-CM | POA: Diagnosis not present

## 2020-09-17 DIAGNOSIS — E1121 Type 2 diabetes mellitus with diabetic nephropathy: Secondary | ICD-10-CM | POA: Diagnosis not present

## 2020-09-17 DIAGNOSIS — E7800 Pure hypercholesterolemia, unspecified: Secondary | ICD-10-CM | POA: Diagnosis not present

## 2020-09-17 DIAGNOSIS — D692 Other nonthrombocytopenic purpura: Secondary | ICD-10-CM | POA: Diagnosis not present

## 2020-09-17 DIAGNOSIS — Z0000 Encounter for general adult medical examination without abnormal findings: Secondary | ICD-10-CM | POA: Diagnosis not present

## 2020-09-17 DIAGNOSIS — E13319 Other specified diabetes mellitus with unspecified diabetic retinopathy without macular edema: Secondary | ICD-10-CM | POA: Diagnosis not present

## 2020-09-17 DIAGNOSIS — Z794 Long term (current) use of insulin: Secondary | ICD-10-CM | POA: Diagnosis not present

## 2020-09-17 DIAGNOSIS — E1151 Type 2 diabetes mellitus with diabetic peripheral angiopathy without gangrene: Secondary | ICD-10-CM | POA: Diagnosis not present

## 2020-09-17 DIAGNOSIS — E1149 Type 2 diabetes mellitus with other diabetic neurological complication: Secondary | ICD-10-CM | POA: Diagnosis not present

## 2020-09-17 DIAGNOSIS — E1129 Type 2 diabetes mellitus with other diabetic kidney complication: Secondary | ICD-10-CM | POA: Diagnosis not present

## 2020-09-23 NOTE — Telephone Encounter (Signed)
Pt called to see if he would be able to stop by today and pick up a physical prescription for: Dexcom Sensor and transmitter as well as Humulin 70/30.  Please call to let him know if this is ok and he can stop by: Ph# [phone]

## 2020-09-23 NOTE — Telephone Encounter (Signed)
Pt called again to request physical copies of prescriptions. Pt also states he needs a Humulin N prescription as well.

## 2020-09-23 NOTE — Telephone Encounter (Signed)
Patient wants to come in a get a hard copy of RX for Dexcom G6 sensor, Novoling70/30, and Humulin N.    Can someone please print these, have the doctor sign them, let the patient know they can be picked up, and drop them at the front desk?

## 2020-09-24 ENCOUNTER — Other Ambulatory Visit: Payer: Self-pay | Admitting: Endocrinology

## 2020-09-24 DIAGNOSIS — E1051 Type 1 diabetes mellitus with diabetic peripheral angiopathy without gangrene: Secondary | ICD-10-CM

## 2020-09-24 MED ORDER — NOVOLIN 70/30 (70-30) 100 UNIT/ML ~~LOC~~ SUSP: 27.0000 [IU] | Freq: Every day | SUBCUTANEOUS | 11 refills | Status: DC

## 2020-09-24 MED ORDER — DEXCOM G6 SENSOR MISC: 1.0000 | 3 refills | Status: DC

## 2020-09-26 ENCOUNTER — Telehealth: Payer: Self-pay

## 2020-09-26 NOTE — Telephone Encounter (Signed)
LVM for pt to let him know that is 2 Rx Novolin/Dexcom sensor are ready for pick-up. Will be placed up frny at check in/out

## 2020-10-07 ENCOUNTER — Ambulatory Visit: Payer: Self-pay

## 2020-11-10 ENCOUNTER — Emergency Department (HOSPITAL_COMMUNITY)
Admission: EM | Admit: 2020-11-10 | Discharge: 2020-11-10 | Discharge status: Against Medical Advice | Payer: Medicare HMO

## 2020-11-10 NOTE — ED Notes (Signed)
Pt left before seeing a provider, family called about an emergency at home. If pt can get that done in time, pt will be back.

## 2020-11-10 NOTE — ED Triage Notes (Signed)
Pt called 3x in Lobby. No answer.

## 2020-12-07 ENCOUNTER — Other Ambulatory Visit: Payer: Self-pay | Admitting: Endocrinology

## 2020-12-07 DIAGNOSIS — E1051 Type 1 diabetes mellitus with diabetic peripheral angiopathy without gangrene: Secondary | ICD-10-CM

## 2020-12-24 ENCOUNTER — Ambulatory Visit: Payer: Medicare HMO | Admitting: Neurology

## 2020-12-26 ENCOUNTER — Ambulatory Visit: Payer: Medicare HMO | Admitting: Endocrinology

## 2020-12-29 ENCOUNTER — Other Ambulatory Visit: Payer: Self-pay | Admitting: Endocrinology

## 2020-12-29 DIAGNOSIS — E1051 Type 1 diabetes mellitus with diabetic peripheral angiopathy without gangrene: Secondary | ICD-10-CM

## 2021-01-07 ENCOUNTER — Encounter: Payer: Self-pay | Admitting: Neurology

## 2021-01-07 ENCOUNTER — Ambulatory Visit: Payer: Medicare HMO | Admitting: Neurology

## 2021-01-07 VITALS — BP 122/56 | HR 93 | Ht 66.5 in | Wt 140.2 lb

## 2021-01-07 DIAGNOSIS — R413 Other amnesia: Secondary | ICD-10-CM

## 2021-01-07 MED ORDER — DONEPEZIL HCL 10 MG PO TABS: 10.0000 mg | ORAL_TABLET | Freq: Every day | ORAL | 3 refills | Status: DC

## 2021-01-07 NOTE — Progress Notes (Signed)
Reason for visit: Memory disturbance  Steven Sanders is an 80 y.o. male  History of present illness:  Steven Sanders is a 80 year old right-handed white male with a history of a memory disturbance.  The patient has diabetes and at least a moderate level of small vessel disease by MRI.  He is now on Aricept, he tolerates this medication well.  He lives alone, his niece from [place] is with him today.  The patient is still operating a motor vehicle, apparently he is doing well in this regard.  He keeps up with his own medications and appointments and pays the bills.  According to the patient, he does this well without problems.  He has not had any motor vehicle accidents or difficulty getting lost.  He uses his cell phone for GPS and to help remind him to take his medications.  He returns to this office for further evaluation.  He believes that he has been relatively stable since last seen.  Past Medical History:  Diagnosis Date   COPD (chronic obstructive pulmonary disease) ([place])    stopped smoking 5 years ago   Diabetes mellitus    GERD (gastroesophageal reflux disease)    Hypercholesterolemia    Hypertension    Memory difficulty 09/06/2018   Peripheral vascular disease ([place])     Past Surgical History:  Procedure Laterality Date   AORTA - BILATERAL FEMORAL ARTERY BYPASS GRAFT N/A 08/11/2013   Procedure: AORTA BIFEMORAL BYPASS GRAFT;  Surgeon: Serafina Mitchell, MD;  Location: [facility];  Service: Vascular;  Laterality: N/A;   COLONOSCOPY     COLONOSCOPY W/ BIOPSIES AND POLYPECTOMY  2008   HERNIA REPAIR      Family History  Problem Relation Age of Onset   Hyperlipidemia Mother    Cancer Mother    Hyperlipidemia Father    Cancer Father    Diabetes Neg Hx     Social history:  reports that he quit smoking about 11 years ago. His smoking use included cigarettes. He has a 50.00 pack-year smoking history. He has never used smokeless tobacco. He reports current alcohol use of about 7.0 -  14.0 standard drinks per week. He reports that he does not use drugs.    Allergies  Allergen Reactions   Neomycin Other (See Comments)    unknown   Tea Tree Oil Other (See Comments)    unknown   Simvastatin    Sulfa Antibiotics Other (See Comments)    unknown   Other Rash    Chlorhexidine gluconate    Medications:  Prior to Admission medications   Medication Sig Start Date End Date Taking? Authorizing Provider  Accu-Chek Softclix Lancets lancets 1 each by Other route 4 (four) times daily. Use to monitor glucose levels four times daily; E10.51 10/13/18  Yes Renato Shin, MD  Alcohol Swabs (B-D SINGLE USE SWABS REGULAR) PADS 1 each by Does not apply route 4 (four) times daily. Use to cleanse site prior to obtaining droplet for glucose monitoring; E10.51 10/13/18  Yes Renato Shin, MD  aspirin 81 MG tablet Take 81 mg by mouth daily. Reported on 04/22/2015   Yes [provider]  Blood Glucose Calibration (ACCU-CHEK AVIVA) SOLN 1 each by In Vitro route as needed. Use to calibrate meter prn; E10.51 10/13/18  Yes Renato Shin, MD  Blood Glucose Monitoring Suppl (ACCU-CHEK AVIVA PLUS) w/Device KIT 1 each by Other route 4 (four) times daily. Use to monitor glucose levels four times daily; E10.51 06/24/20  Yes  Renato Shin, MD  cilostazol (PLETAL) 100 MG tablet TAKE 1 TABLET TWICE DAILY BEFORE MEALS 05/21/20  Yes Serafina Mitchell, MD  Continuous Blood Gluc Receiver (DEXCOM G6 RECEIVER) DEVI Use as directed or needed 01/01/20  Yes Renato Shin, MD  Continuous Blood Gluc Sensor (DEXCOM G6 SENSOR) MISC CHANGE EVERY 10 DAYS AS DIRECTED 12/30/20  Yes Renato Shin, MD  Continuous Blood Gluc Transmit (DEXCOM G6 TRANSMITTER) MISC USE  AS DIRECTED FOR 3 MONTHS 12/30/20  Yes Renato Shin, MD  donepezil (ARICEPT) 10 MG tablet Take 1 tablet (10 mg total) by mouth at bedtime. 07/15/20  Yes Kathrynn Ducking, MD  DROPLET INSULIN SYRINGE 31G X 5/16" 0.5 ML MISC USE TO INJECT INSULIN 2 TIMES PER DAY 05/09/18   Yes Renato Shin, MD  glucose blood (ACCU-CHEK AVIVA PLUS) test strip 1 each by Other route 4 (four) times daily. Use to monitor glucose levels four times daily; E10.51 10/13/18  Yes Renato Shin, MD  insulin NPH-regular Human (NOVOLIN 70/30) (70-30) 100 UNIT/ML injection Inject 27 Units into the skin daily with breakfast. 09/24/20  Yes Renato Shin, MD  Insulin Syringe-Needle U-100 (BD SAFETYGLIDE INSULIN SYRINGE) 31G X 15/64" 0.5 ML MISC 1 each by Does not apply route 2 (two) times daily. 02/13/19  Yes Renato Shin, MD  lisinopril (PRINIVIL,ZESTRIL) 5 MG tablet Take 5 mg by mouth daily.   Yes [provider]  pantoprazole (PROTONIX) 40 MG tablet  01/22/16  Yes [provider]  PRESCRIPTION MEDICATION Insulin-R if needed.   Yes [provider]  rosuvastatin (CRESTOR) 40 MG tablet Take 40 mg by mouth daily.   Yes [provider]    ROS:  Out of a complete 14 system review of symptoms, the patient complains only of the following symptoms, and all other reviewed systems are negative.  Memory disturbance  Blood pressure (!) 122/56, pulse 93, height 5' 6.5" (1.689 m), weight 140 lb 4 oz (63.6 kg), SpO2 98 %.  Physical Exam  General: The patient is alert and cooperative at the time of the examination.  Skin: No significant peripheral edema is noted.   Neurologic Exam  Mental status: The patient is alert and oriented x 3 at the time of the examination. The Mini-Mental status examination done today shows a total score 23/30.   Cranial nerves: Facial symmetry is present. Speech is normal, no aphasia or dysarthria is noted. Extraocular movements are full. Visual fields are full.  Motor: The patient has good strength in all 4 extremities.  Sensory examination: Soft touch sensation is symmetric on the face, arms, and legs.  Coordination: The patient has good finger-nose-finger and heel-to-shin bilaterally.  Gait and station: The patient has a normal  gait. Tandem gait is slightly unsteady. Romberg is negative. No drift is seen.  Reflexes: Deep tendon reflexes are symmetric.   Assessment/Plan:  1.  Memory disturbance  The patient is doing fairly well currently but I indicated that he will need to plan for the future.  He at some point will likely need assistance keeping up with activities daily living.  I will send him for speech therapy evaluation for cognitive training.  He will follow-up here in 6 months, in the future he can be seen through Dr. Brett Fairy.  A prescription for his Aricept was sent in.  Jill Alexanders MD 01/07/2021 9:14 AM  [facility] [address] [place], [place] [address]  Phone [phone] Fax [phone]

## 2021-01-08 ENCOUNTER — Encounter: Payer: Self-pay | Admitting: Endocrinology

## 2021-01-08 ENCOUNTER — Other Ambulatory Visit: Payer: Self-pay

## 2021-01-08 ENCOUNTER — Ambulatory Visit (INDEPENDENT_AMBULATORY_CARE_PROVIDER_SITE_OTHER): Payer: Medicare HMO | Admitting: Endocrinology

## 2021-01-08 VITALS — BP 140/58 | HR 92 | Ht 66.5 in | Wt 138.8 lb

## 2021-01-08 DIAGNOSIS — E1051 Type 1 diabetes mellitus with diabetic peripheral angiopathy without gangrene: Secondary | ICD-10-CM

## 2021-01-08 DIAGNOSIS — E1129 Type 2 diabetes mellitus with other diabetic kidney complication: Secondary | ICD-10-CM | POA: Diagnosis not present

## 2021-01-08 DIAGNOSIS — R413 Other amnesia: Secondary | ICD-10-CM | POA: Diagnosis not present

## 2021-01-08 LAB — POCT GLYCOSYLATED HEMOGLOBIN (HGB A1C): Hemoglobin A1C: 6.9 % — AB (ref 4.0–5.6)

## 2021-01-08 NOTE — Progress Notes (Signed)
Subjective:    Patient ID: Steven Sanders, male    DOB: October 01, 1940, 80 y.o.   MRN: [id number]  HPI Pt returns for f/u of diabetes mellitus: DM type: 1 Dx'ed: 0131 Complications: PN, DN, DR, and PAD.  Therapy: insulin since dx.   DKA: never. Severe hypoglycemia: multiple episodes, most recently in 2015.   Pancreatitis: never.  SDOH: he takes human insulin, due to cost.   Other: he has declined multiple daily injections; he has a dexcom-G6 continuous glucose monitor; in 2020, he chose to d/c pump; he eats meals at 12N and 7PM.  Interval history: I reviewed continuous glucose monitor data, and it is scanned into the record.  Glucose varies from 45-250.  is in general lowest at 10AM and 3-5PM, but there is little trend throughout the day.  He says 70/30 insulin is 30 units qam, and he still takes PRN regular insulin (averages 5 units per day).  He has mild hypoglycemia 1-2 times per week.   Past Medical History:  Diagnosis Date   COPD (chronic obstructive pulmonary disease) ([place])    stopped smoking 5 years ago   Diabetes mellitus    GERD (gastroesophageal reflux disease)    Hypercholesterolemia    Hypertension    Memory difficulty 09/06/2018   Peripheral vascular disease ([place])     Past Surgical History:  Procedure Laterality Date   AORTA - BILATERAL FEMORAL ARTERY BYPASS GRAFT N/A 08/11/2013   Procedure: AORTA BIFEMORAL BYPASS GRAFT;  Surgeon: Serafina Mitchell, MD;  Location: [facility];  Service: Vascular;  Laterality: N/A;   COLONOSCOPY     COLONOSCOPY W/ BIOPSIES AND POLYPECTOMY  2008   HERNIA REPAIR      Social History   Socioeconomic History   Marital status: Widowed    Spouse name: Not on file   Number of children: Not on file   Years of education: Not on file   Highest education level: Not on file  Occupational History   Occupation: Retired  Tobacco Use   Smoking status: Former    Packs/day: 1.00    Years: 50.00    Pack years: 50.00    Types: Cigarettes    Quit  date: 01/14/2009    Years since quitting: 12.0   Smokeless tobacco: Never  Vaping Use   Vaping Use: Never used  Substance and Sexual Activity   Alcohol use: Yes    Alcohol/week: 7.0 - 14.0 standard drinks    Types: 7 - 14 Shots of liquor per week    Comment: occasionally beer as well. 1-2 scotch per day   Drug use: No   Sexual activity: Not on file  Other Topics Concern   Not on file  Social History Narrative   Right Handed    Lives at home alone   Caffeine 5 cups daily   Social Determinants of Health   Financial Resource Strain: Not on file  Food Insecurity: Not on file  Transportation Needs: Not on file  Physical Activity: Not on file  Stress: Not on file  Social Connections: Not on file  Intimate Partner Violence: Not on file    Current Outpatient Medications on File Prior to Visit  Medication Sig Dispense Refill   Accu-Chek Softclix Lancets lancets 1 each by Other route 4 (four) times daily. Use to monitor glucose levels four times daily; E10.51 100 each 3   Alcohol Swabs (B-D SINGLE USE SWABS REGULAR) PADS 1 each by Does not apply route 4 (four) times daily. Use  to cleanse site prior to obtaining droplet for glucose monitoring; E10.51 100 each 3   aspirin 81 MG tablet Take 81 mg by mouth daily. Reported on 04/22/2015     Blood Glucose Calibration (ACCU-CHEK AVIVA) SOLN 1 each by In Vitro route as needed. Use to calibrate meter prn; E10.51 1 each 0   Blood Glucose Monitoring Suppl (ACCU-CHEK AVIVA PLUS) w/Device KIT 1 each by Other route 4 (four) times daily. Use to monitor glucose levels four times daily; E10.51 1 kit 0   cilostazol (PLETAL) 100 MG tablet TAKE 1 TABLET TWICE DAILY BEFORE MEALS 180 tablet 3   Continuous Blood Gluc Receiver (DEXCOM G6 RECEIVER) DEVI Use as directed or needed 1 each 1   Continuous Blood Gluc Sensor (DEXCOM G6 SENSOR) MISC CHANGE EVERY 10 DAYS AS DIRECTED 3 each 0   Continuous Blood Gluc Transmit (DEXCOM G6 TRANSMITTER) MISC USE  AS DIRECTED FOR  3 MONTHS 1 each 0   donepezil (ARICEPT) 10 MG tablet Take 1 tablet (10 mg total) by mouth at bedtime. 90 tablet 3   DROPLET INSULIN SYRINGE 31G X 5/16" 0.5 ML MISC USE TO INJECT INSULIN 2 TIMES PER DAY 200 each 2   glucose blood (ACCU-CHEK AVIVA PLUS) test strip 1 each by Other route 4 (four) times daily. Use to monitor glucose levels four times daily; E10.51 100 each 3   insulin NPH-regular Human (NOVOLIN 70/30) (70-30) 100 UNIT/ML injection Inject 27 Units into the skin daily with breakfast. 30 mL 11   Insulin Syringe-Needle U-100 (BD SAFETYGLIDE INSULIN SYRINGE) 31G X 15/64" 0.5 ML MISC 1 each by Does not apply route 2 (two) times daily. 180 each 2   lisinopril (PRINIVIL,ZESTRIL) 5 MG tablet Take 5 mg by mouth daily.     pantoprazole (PROTONIX) 40 MG tablet      PRESCRIPTION MEDICATION Insulin-R if needed.     rosuvastatin (CRESTOR) 40 MG tablet Take 40 mg by mouth daily.     No current facility-administered medications on file prior to visit.    Allergies  Allergen Reactions   Neomycin Other (See Comments)    unknown   Tea Tree Oil Other (See Comments)    unknown   Simvastatin    Sulfa Antibiotics Other (See Comments)    unknown   Other Rash    Chlorhexidine gluconate    Family History  Problem Relation Age of Onset   Hyperlipidemia Mother    Cancer Mother    Hyperlipidemia Father    Cancer Father    Diabetes Neg Hx     BP (!) 140/58 (BP Location: Right Arm, Patient Position: Sitting, Cuff Size: Normal)   Pulse 92   Ht 5' 6.5" (1.689 m)   Wt 138 lb 12.8 oz (63 kg)   SpO2 97%   BMI 22.07 kg/m    Review of Systems     Objective:   Physical Exam Pulses: dorsalis pedis intact bilat.   MSK: no deformity of the feet CV: 1+ leg edema, but there are bilat vv's.  Skin:  no ulcer on the feet.  normal color and temp on the feet.   Neuro: sensation is intact to touch on the feet.     Lab Results  Component Value Date   HGBA1C 6.9 (A) 01/08/2021      Assessment &  Plan:  Type 1 DM: overcontrolled.    Patient Instructions  Please continue the same 70/30 insulin, and avoid the regular altogether.  On this type of insulin schedule,  you should eat meals on a regular schedule.  If a meal is missed or significantly delayed, your blood sugar could go low.   Please come back for a follow-up appointment in 3 months.

## 2021-01-08 NOTE — Patient Instructions (Addendum)
Please continue the same 70/30 insulin, and avoid the regular altogether.  On this type of insulin schedule, you should eat meals on a regular schedule.  If a meal is missed or significantly delayed, your blood sugar could go low.   Please come back for a follow-up appointment in 3 months.

## 2021-01-10 DIAGNOSIS — Z23 Encounter for immunization: Secondary | ICD-10-CM | POA: Diagnosis not present

## 2021-03-04 ENCOUNTER — Telehealth: Payer: Self-pay | Admitting: Endocrinology

## 2021-03-04 ENCOUNTER — Other Ambulatory Visit: Payer: Self-pay

## 2021-03-04 DIAGNOSIS — E1051 Type 1 diabetes mellitus with diabetic peripheral angiopathy without gangrene: Secondary | ICD-10-CM

## 2021-03-04 MED ORDER — DEXCOM G6 SENSOR MISC: 0 refills | Status: DC

## 2021-03-04 NOTE — Telephone Encounter (Signed)
MEDICATION: Continuous Blood Gluc Sensor (DEXCOM G6 SENSOR) MISC   PHARMACY:   [id number] - [place], [place] - [address] Phone:  [phone]  Fax:  [phone]      HAS THE PATIENT CONTACTED THEIR PHARMACY?  no  IS THIS A 90 DAY SUPPLY : no PT wants 1 of each  IS PATIENT OUT OF MEDICATION: yes  IF NOT; HOW MUCH IS LEFT:   LAST APPOINTMENT DATE: @10 /26/2022  NEXT APPOINTMENT DATE:@1 /26/2023  DO WE HAVE YOUR PERMISSION TO LEAVE A DETAILED MESSAGE?:  OTHER COMMENTS: PT NEEDS TODAY   **Let patient know to contact pharmacy at the end of the day to make sure medication is ready. **  ** Please notify patient to allow 48-72 hours to process**  **Encourage patient to contact the pharmacy for refills or they can request refills through [facility]**

## 2021-03-04 NOTE — Telephone Encounter (Signed)
RX sent

## 2021-04-10 ENCOUNTER — Ambulatory Visit: Payer: Medicare HMO | Admitting: Endocrinology

## 2021-04-28 ENCOUNTER — Ambulatory Visit: Payer: Medicare HMO | Admitting: Endocrinology

## 2021-05-15 ENCOUNTER — Other Ambulatory Visit: Payer: Self-pay | Admitting: Endocrinology

## 2021-05-15 DIAGNOSIS — E1051 Type 1 diabetes mellitus with diabetic peripheral angiopathy without gangrene: Secondary | ICD-10-CM

## 2021-05-16 ENCOUNTER — Other Ambulatory Visit: Payer: Self-pay

## 2021-05-16 ENCOUNTER — Telehealth: Payer: Self-pay

## 2021-05-16 DIAGNOSIS — E1051 Type 1 diabetes mellitus with diabetic peripheral angiopathy without gangrene: Secondary | ICD-10-CM

## 2021-05-16 MED ORDER — NOVOLIN 70/30 (70-30) 100 UNIT/ML ~~LOC~~ SUSP: 27.0000 [IU] | Freq: Every day | SUBCUTANEOUS | 11 refills | Status: DC

## 2021-05-16 MED ORDER — DEXCOM G6 TRANSMITTER MISC: 0 refills | Status: DC

## 2021-05-16 MED ORDER — DEXCOM G6 SENSOR MISC: 0 refills | Status: DC

## 2021-05-16 NOTE — Telephone Encounter (Signed)
Pt has been advised that the Novolin R is not safe to take ?

## 2021-05-16 NOTE — Telephone Encounter (Signed)
Spoke with pt and he needed an refill on his insulin. Pt stated that he needs a script for Novolin R and the 70/30 but as far as the Novolin R I do not see this on his med list active. He stated that he needs the R for when is BS goes high.  ? ?Please advise on this  ?

## 2021-05-28 ENCOUNTER — Encounter: Payer: Self-pay | Admitting: Endocrinology

## 2021-05-28 ENCOUNTER — Ambulatory Visit: Payer: Medicare HMO | Admitting: Endocrinology

## 2021-05-28 ENCOUNTER — Other Ambulatory Visit: Payer: Self-pay

## 2021-05-28 VITALS — BP 164/66 | HR 96 | Ht 66.5 in | Wt 141.8 lb

## 2021-05-28 DIAGNOSIS — E1051 Type 1 diabetes mellitus with diabetic peripheral angiopathy without gangrene: Secondary | ICD-10-CM

## 2021-05-28 DIAGNOSIS — E109 Type 1 diabetes mellitus without complications: Secondary | ICD-10-CM | POA: Diagnosis not present

## 2021-05-28 DIAGNOSIS — J449 Chronic obstructive pulmonary disease, unspecified: Secondary | ICD-10-CM | POA: Diagnosis not present

## 2021-05-28 DIAGNOSIS — Z794 Long term (current) use of insulin: Secondary | ICD-10-CM | POA: Diagnosis not present

## 2021-05-28 LAB — POCT GLYCOSYLATED HEMOGLOBIN (HGB A1C): Hemoglobin A1C: 6.8 % — AB (ref 4.0–5.6)

## 2021-05-28 NOTE — Patient Instructions (Addendum)
Please reduce the 70/30 insulin to 27 units with breakfast, and avoid the regular altogether.   ?On this type of insulin schedule, you should eat meals on a regular schedule.  If a meal is missed or significantly delayed, your blood sugar could go low.   ?Please come back for a follow-up appointment in 2 months.   ? ?

## 2021-05-28 NOTE — Progress Notes (Signed)
? ?Subjective:  ? ? Patient ID: Steven Sanders, male    DOB: Jul 27, 1940, 81 y.o.   MRN: [id number] ? ?HPI ?Pt returns for f/u of diabetes mellitus:  ?DM type: 1 ?Dx'ed: 1960 ?Complications: PN, DN, DR, and PAD.  ?Therapy: insulin since dx.   ?DKA: never. ?Severe hypoglycemia: multiple episodes, most recently in 2015.   ?Pancreatitis: never.  ?SDOH: he takes human insulin, due to cost.   ?Other: he has declined multiple daily injections; he has a dexcom-G6 continuous glucose monitor; in 2020, he chose to d/c pump; he eats meals at 12N and 7PM.  ?Interval history:  He says 70/30 insulin is 30 units qam.  He seldom takes regular insulin.  He seldom has hypoglycemia, and these episodes are mild. We are unable to download CGM receiver.   ?Past Medical History:  ?Diagnosis Date  ? COPD (chronic obstructive pulmonary disease) ([place])   ? stopped smoking 5 years ago  ? Diabetes mellitus   ? GERD (gastroesophageal reflux disease)   ? Hypercholesterolemia   ? Hypertension   ? Memory difficulty 09/06/2018  ? Peripheral vascular disease ([place])   ? ? ?Past Surgical History:  ?Procedure Laterality Date  ? AORTA - BILATERAL FEMORAL ARTERY BYPASS GRAFT N/A 08/11/2013  ? Procedure: AORTA BIFEMORAL BYPASS GRAFT;  Surgeon: Serafina Mitchell, MD;  Location: [facility];  Service: Vascular;  Laterality: N/A;  ? COLONOSCOPY    ? COLONOSCOPY W/ BIOPSIES AND POLYPECTOMY  2008  ? HERNIA REPAIR    ? ? ?Social History  ? ?Socioeconomic History  ? Marital status: Widowed  ?  Spouse name: Not on file  ? Number of children: Not on file  ? Years of education: Not on file  ? Highest education level: Not on file  ?Occupational History  ? Occupation: Retired  ?Tobacco Use  ? Smoking status: Former  ?  Packs/day: 1.00  ?  Years: 50.00  ?  Pack years: 50.00  ?  Types: Cigarettes  ?  Quit date: 01/14/2009  ?  Years since quitting: 12.3  ? Smokeless tobacco: Never  ?Vaping Use  ? Vaping Use: Never used  ?Substance and Sexual Activity  ? Alcohol use: Yes  ?   Alcohol/week: 7.0 - 14.0 standard drinks  ?  Types: 7 - 14 Shots of liquor per week  ?  Comment: occasionally beer as well. 1-2 scotch per day  ? Drug use: No  ? Sexual activity: Not on file  ?Other Topics Concern  ? Not on file  ?Social History Narrative  ? Right Handed   ? Lives at home alone  ? Caffeine 5 cups daily  ? ?Social Determinants of Health  ? ?Financial Resource Strain: Not on file  ?Food Insecurity: Not on file  ?Transportation Needs: Not on file  ?Physical Activity: Not on file  ?Stress: Not on file  ?Social Connections: Not on file  ?Intimate Partner Violence: Not on file  ? ? ?Current Outpatient Medications on File Prior to Visit  ?Medication Sig Dispense Refill  ? Accu-Chek Softclix Lancets lancets 1 each by Other route 4 (four) times daily. Use to monitor glucose levels four times daily; E10.51 100 each 3  ? Alcohol Swabs (B-D SINGLE USE SWABS REGULAR) PADS 1 each by Does not apply route 4 (four) times daily. Use to cleanse site prior to obtaining droplet for glucose monitoring; E10.51 100 each 3  ? aspirin 81 MG tablet Take 81 mg by mouth daily. Reported on 04/22/2015    ?  Blood Glucose Calibration (ACCU-CHEK AVIVA) SOLN 1 each by In Vitro route as needed. Use to calibrate meter prn; E10.51 1 each 0  ? Blood Glucose Monitoring Suppl (ACCU-CHEK AVIVA PLUS) w/Device KIT 1 each by Other route 4 (four) times daily. Use to monitor glucose levels four times daily; E10.51 1 kit 0  ? cilostazol (PLETAL) 100 MG tablet TAKE 1 TABLET TWICE DAILY BEFORE MEALS 180 tablet 3  ? Continuous Blood Gluc Receiver (DEXCOM G6 RECEIVER) DEVI Use as directed or needed 1 each 1  ? Continuous Blood Gluc Sensor (DEXCOM G6 SENSOR) MISC CHANGE EVERY 10 DAYS AS DIRECTED 3 each 0  ? Continuous Blood Gluc Transmit (DEXCOM G6 TRANSMITTER) MISC USE  AS DIRECTED FOR 3 MONTHS 1 each 0  ? donepezil (ARICEPT) 10 MG tablet Take 1 tablet (10 mg total) by mouth at bedtime. 90 tablet 3  ? DROPLET INSULIN SYRINGE 31G X 5/16" 0.5 ML MISC USE  TO INJECT INSULIN 2 TIMES PER DAY 200 each 2  ? glucose blood (ACCU-CHEK AVIVA PLUS) test strip 1 each by Other route 4 (four) times daily. Use to monitor glucose levels four times daily; E10.51 100 each 3  ? insulin NPH-regular Human (NOVOLIN 70/30) (70-30) 100 UNIT/ML injection Inject 27 Units into the skin daily with breakfast. 30 mL 11  ? Insulin Syringe-Needle U-100 (BD SAFETYGLIDE INSULIN SYRINGE) 31G X 15/64" 0.5 ML MISC 1 each by Does not apply route 2 (two) times daily. 180 each 2  ? lisinopril (PRINIVIL,ZESTRIL) 5 MG tablet Take 5 mg by mouth daily.    ? pantoprazole (PROTONIX) 40 MG tablet     ? PRESCRIPTION MEDICATION Insulin-R if needed.    ? rosuvastatin (CRESTOR) 40 MG tablet Take 40 mg by mouth daily.    ? ?No current facility-administered medications on file prior to visit.  ? ? ?Allergies  ?Allergen Reactions  ? Neomycin Other (See Comments)  ?  unknown  ? Tea Tree Oil Other (See Comments)  ?  unknown  ? Simvastatin   ? Sulfa Antibiotics Other (See Comments)  ?  unknown  ? Other Rash  ?  Chlorhexidine gluconate  ? ? ?Family History  ?Problem Relation Age of Onset  ? Hyperlipidemia Mother   ? Cancer Mother   ? Hyperlipidemia Father   ? Cancer Father   ? Diabetes Neg Hx   ? ? ?BP (!) 164/66 (BP Location: Left Arm, Patient Position: Sitting, Cuff Size: Normal)   Pulse 96   Ht 5' 6.5" (1.689 m)   Wt 141 lb 12.8 oz (64.3 kg)   SpO2 95%   BMI 22.54 kg/m?  ? ? ?Review of Systems ? ?   ?Objective:  ? Physical Exam ?VITAL SIGNS:  See vs page.   ?GENERAL: no distress.   ? ? ? ?A1c=6.8% ?   ?Assessment & Plan:  ?Type 1 DM: overcontrolled.   ? ?Patient Instructions  ?Please reduce the 70/30 insulin to 27 units with breakfast, and avoid the regular altogether.   ?On this type of insulin schedule, you should eat meals on a regular schedule.  If a meal is missed or significantly delayed, your blood sugar could go low.   ?Please come back for a follow-up appointment in 2 months.   ? ? ? ?

## 2021-06-09 ENCOUNTER — Other Ambulatory Visit: Payer: Self-pay

## 2021-06-09 ENCOUNTER — Other Ambulatory Visit: Payer: Self-pay | Admitting: Endocrinology

## 2021-06-09 DIAGNOSIS — E1051 Type 1 diabetes mellitus with diabetic peripheral angiopathy without gangrene: Secondary | ICD-10-CM

## 2021-06-09 MED ORDER — DEXCOM G6 SENSOR MISC: 0 refills | Status: DC

## 2021-06-09 MED ORDER — DEXCOM G6 TRANSMITTER MISC: 0 refills | Status: DC

## 2021-06-09 NOTE — Telephone Encounter (Signed)
Attempted to call the patient to inform him that rx has been sent in. LVM for any questions or concerns. ?

## 2021-07-01 ENCOUNTER — Other Ambulatory Visit: Payer: Self-pay | Admitting: Endocrinology

## 2021-07-01 DIAGNOSIS — E1051 Type 1 diabetes mellitus with diabetic peripheral angiopathy without gangrene: Secondary | ICD-10-CM

## 2021-07-07 ENCOUNTER — Other Ambulatory Visit: Payer: Self-pay | Admitting: *Deleted

## 2021-07-07 DIAGNOSIS — Z8679 Personal history of other diseases of the circulatory system: Secondary | ICD-10-CM

## 2021-07-07 DIAGNOSIS — R0989 Other specified symptoms and signs involving the circulatory and respiratory systems: Secondary | ICD-10-CM

## 2021-07-07 DIAGNOSIS — Z87891 Personal history of nicotine dependence: Secondary | ICD-10-CM

## 2021-07-07 DIAGNOSIS — Z95828 Presence of other vascular implants and grafts: Secondary | ICD-10-CM

## 2021-07-07 MED ORDER — CILOSTAZOL 100 MG PO TABS: ORAL_TABLET | ORAL | 3 refills | Status: AC

## 2021-07-08 ENCOUNTER — Encounter: Payer: Self-pay | Admitting: Neurology

## 2021-07-08 ENCOUNTER — Ambulatory Visit: Payer: Medicare HMO | Admitting: Neurology

## 2021-07-08 VITALS — BP 142/60 | HR 94 | Ht 66.0 in | Wt 138.0 lb

## 2021-07-08 DIAGNOSIS — R413 Other amnesia: Secondary | ICD-10-CM

## 2021-07-08 DIAGNOSIS — J449 Chronic obstructive pulmonary disease, unspecified: Secondary | ICD-10-CM | POA: Diagnosis not present

## 2021-07-08 MED ORDER — DONEPEZIL HCL 10 MG PO TABS
10.0000 mg | ORAL_TABLET | Freq: Every day | ORAL | 3 refills | Status: DC
Start: 2021-07-08 — End: 2021-08-12

## 2021-07-08 MED ORDER — MEMANTINE HCL 10 MG PO TABS: ORAL_TABLET | ORAL | 1 refills | Status: DC

## 2021-07-08 NOTE — Progress Notes (Signed)
? ? ?Patient: Steven Sanders ?Date of Birth: [birth year] ? ?Reason for Visit: Follow up for memory ?History from: Patient, niece  ?Primary Neurologist: Dr. Brett Sanders ? ?ASSESSMENT AND PLAN ?81 y.o. year old male  ? ?1.  Mild cognitive impairment ?2.  Moderate small vessel disease by MRI of brain ?3.  Type I diabetic ? ?-MMSE 25/30, continues to function fairly well independently, manages his own affairs but is having more trouble with his finances ?-Add on Namenda working up to 10 mg twice daily, will continue Aricept ?-Discussed plan for the future, if needed, he would be willing to relocate to [place] where his family lives ?-We discussed his driving, need to continuously evaluate for safety ?-I provided his niece with the memory packet, may consider having an aide check on him  ?-Follow-up in 6 months or sooner if needed ? ?HISTORY OF PRESENT ILLNESS: ?Today 07/08/21 ?Steven Sanders here today for follow-up. Remains on Aricept. MMSE 25/30 today. Lives alone, drives a car. Relies on cell phone GPS for directions. Manages his housework. Few times has been late paying a bill or paying twice, niece has offered to take over bills. Mostly eats TV dinners. Manages his medications. Only eats 2 meals a day. He manages his insulin, does this well. Sleeps well. No falls. Goal is 5 miles a day, walks in his neighborhood. No health issues. Does reading and puzzles. He is isolated but likes it this way. All his family lives in [place]. Long term plans to relocate back if his medical condition were to decline.  ? ?HISTORY  ?01/07/2021 Dr. Jannifer Sanders: Steven Sanders is a 81 year old right-handed white male with a history of a memory disturbance.  The patient has diabetes and at least a moderate level of small vessel disease by MRI.  He is now on Aricept, he tolerates this medication well.  He lives alone, his niece from [place] is with him today.  The patient is still operating a motor vehicle, apparently he is doing well in this  regard.  He keeps up with his own medications and appointments and pays the bills.  According to the patient, he does this well without problems.  He has not had any motor vehicle accidents or difficulty getting lost.  He uses his cell phone for GPS and to help remind him to take his medications.  He returns to this office for further evaluation.  He believes that he has been relatively stable since last seen. ? ?REVIEW OF SYSTEMS: Out of a complete 14 system review of symptoms, the patient complains only of the following symptoms, and all other reviewed systems are negative. ? ?See HPI ? ?ALLERGIES: ?Allergies  ?Allergen Reactions  ? Neomycin Other (See Comments)  ?  unknown  ? Tea Tree Oil Other (See Comments)  ?  unknown  ? Simvastatin   ? Sulfa Antibiotics Other (See Comments)  ?  unknown  ? Other Rash  ?  Chlorhexidine gluconate  ? ? ?HOME MEDICATIONS: ?Outpatient Medications Prior to Visit  ?Medication Sig Dispense Refill  ? Accu-Chek Softclix Lancets lancets 1 each by Other route 4 (four) times daily. Use to monitor glucose levels four times daily; E10.51 100 each 3  ? Alcohol Swabs (B-D SINGLE USE SWABS REGULAR) PADS 1 each by Does not apply route 4 (four) times daily. Use to cleanse site prior to obtaining droplet for glucose monitoring; E10.51 100 each 3  ? aspirin 81 MG tablet Take 81 mg by mouth daily. Reported on 04/22/2015    ?  Blood Glucose Calibration (ACCU-CHEK AVIVA) SOLN 1 each by In Vitro route as needed. Use to calibrate meter prn; E10.51 1 each 0  ? Blood Glucose Monitoring Suppl (ACCU-CHEK AVIVA PLUS) w/Device KIT 1 each by Other route 4 (four) times daily. Use to monitor glucose levels four times daily; E10.51 1 kit 0  ? cilostazol (PLETAL) 100 MG tablet TAKE 1 TABLET TWICE DAILY BEFORE MEALS 180 tablet 3  ? Continuous Blood Gluc Receiver (DEXCOM G6 RECEIVER) DEVI Use as directed or needed 1 each 1  ? Continuous Blood Gluc Sensor (DEXCOM G6 SENSOR) MISC CAHNGE EVERY 10 DAYS AS DIRECTED 3 each 0   ? Continuous Blood Gluc Transmit (DEXCOM G6 TRANSMITTER) MISC USE AS DIRECTED 1 each 0  ? donepezil (ARICEPT) 10 MG tablet Take 1 tablet (10 mg total) by mouth at bedtime. 90 tablet 3  ? DROPLET INSULIN SYRINGE 31G X 5/16" 0.5 ML MISC USE TO INJECT INSULIN 2 TIMES PER DAY 200 each 2  ? glucose blood (ACCU-CHEK AVIVA PLUS) test strip 1 each by Other route 4 (four) times daily. Use to monitor glucose levels four times daily; E10.51 100 each 3  ? insulin NPH-regular Human (NOVOLIN 70/30) (70-30) 100 UNIT/ML injection Inject 27 Units into the skin daily with breakfast. 30 mL 11  ? Insulin Syringe-Needle U-100 (BD SAFETYGLIDE INSULIN SYRINGE) 31G X 15/64" 0.5 ML MISC 1 each by Does not apply route 2 (two) times daily. 180 each 2  ? lisinopril (PRINIVIL,ZESTRIL) 5 MG tablet Take 5 mg by mouth daily.    ? pantoprazole (PROTONIX) 40 MG tablet     ? PRESCRIPTION MEDICATION Insulin-R if needed.    ? rosuvastatin (CRESTOR) 40 MG tablet Take 40 mg by mouth daily.    ? ?No facility-administered medications prior to visit.  ? ? ?PAST MEDICAL HISTORY: ?Past Medical History:  ?Diagnosis Date  ? COPD (chronic obstructive pulmonary disease) ([place])   ? stopped smoking 5 years ago  ? Diabetes mellitus   ? GERD (gastroesophageal reflux disease)   ? Hypercholesterolemia   ? Hypertension   ? Memory difficulty 09/06/2018  ? Peripheral vascular disease ([place])   ? ? ?PAST SURGICAL HISTORY: ?Past Surgical History:  ?Procedure Laterality Date  ? AORTA - BILATERAL FEMORAL ARTERY BYPASS GRAFT N/A 08/11/2013  ? Procedure: AORTA BIFEMORAL BYPASS GRAFT;  Surgeon: Serafina Mitchell, MD;  Location: [facility];  Service: Vascular;  Laterality: N/A;  ? COLONOSCOPY    ? COLONOSCOPY W/ BIOPSIES AND POLYPECTOMY  2008  ? HERNIA REPAIR    ? ? ?FAMILY HISTORY: ?Family History  ?Problem Relation Age of Onset  ? Hyperlipidemia Mother   ? Cancer Mother   ? Hyperlipidemia Father   ? Cancer Father   ? Diabetes Neg Hx   ? ? ?SOCIAL HISTORY: ?Social History  ? ?Socioeconomic  History  ? Marital status: Widowed  ?  Spouse name: Not on file  ? Number of children: Not on file  ? Years of education: Not on file  ? Highest education level: Not on file  ?Occupational History  ? Occupation: Retired  ?Tobacco Use  ? Smoking status: Former  ?  Packs/day: 1.00  ?  Years: 50.00  ?  Pack years: 50.00  ?  Types: Cigarettes  ?  Quit date: 01/14/2009  ?  Years since quitting: 12.4  ? Smokeless tobacco: Never  ?Vaping Use  ? Vaping Use: Never used  ?Substance and Sexual Activity  ? Alcohol use: Yes  ?  Alcohol/week: 7.0 -  14.0 standard drinks  ?  Types: 7 - 14 Shots of liquor per week  ?  Comment: occasionally beer as well. 1-2 scotch per day  ? Drug use: No  ? Sexual activity: Not on file  ?Other Topics Concern  ? Not on file  ?Social History Narrative  ? Right Handed   ? Lives at home alone  ? Caffeine 5 cups daily  ? ?Social Determinants of Health  ? ?Financial Resource Strain: Not on file  ?Food Insecurity: Not on file  ?Transportation Needs: Not on file  ?Physical Activity: Not on file  ?Stress: Not on file  ?Social Connections: Not on file  ?Intimate Partner Violence: Not on file  ? ? ?PHYSICAL EXAM ? ?Vitals:  ? 07/08/21 0821  ?BP: (!) 142/60  ?Pulse: 94  ?Weight: 138 lb (62.6 kg)  ?Height: 5\' 6"  (1.676 m)  ? ?Body mass index is 22.27 kg/m?. ? ?  07/08/2021  ?  8:23 AM 01/07/2021  ?  9:15 AM 06/27/2020  ?  7:32 AM  ?MMSE - Mini Mental State Exam  ?Orientation to time 3 2 4   ?Orientation to Place 5 4 4   ?Registration 3 3 3   ?Attention/ Calculation 4 3 1   ?Recall 1 3 0  ?Language- name 2 objects 2 2 2   ?Language- repeat 1 1 1   ?Language- follow 3 step command 3 3 3   ?Language- read & follow direction 1 1 1   ?Write a sentence 1 1 1   ?Copy design 1 0 1  ?Total score 25 23 21   ? ? ?Generalized: Well developed, in no acute distress  ?Neurological examination  ?Mentation: Alert oriented to time, place, history taking. Follows all commands speech and language fluent, very pleasant, cooperative ?Cranial  nerve II-XII: Pupils were equal round reactive to light. Extraocular movements were full, visual field were full on confrontational test. Facial sensation and strength were normal. Head turning and sho

## 2021-07-08 NOTE — Patient Instructions (Signed)
Meds ordered this encounter  ?Medications  ? memantine (NAMENDA) 10 MG tablet  ?  Sig: Start taking 1/2 tablet twice daily x 2 weeks, then take 1 tablet twice daily  ?  Dispense:  180 tablet  ?  Refill:  1  ? donepezil (ARICEPT) 10 MG tablet  ?  Sig: Take 1 tablet (10 mg total) by mouth at bedtime.  ?  Dispense:  90 tablet  ?  Refill:  3  ? ?We will start Namenda for the memory, watch out for dizziness  ?See you back in 6 months ? ? ?

## 2021-07-09 ENCOUNTER — Other Ambulatory Visit: Payer: Self-pay | Admitting: Endocrinology

## 2021-07-09 DIAGNOSIS — E1051 Type 1 diabetes mellitus with diabetic peripheral angiopathy without gangrene: Secondary | ICD-10-CM

## 2021-07-10 ENCOUNTER — Other Ambulatory Visit: Payer: Self-pay

## 2021-07-10 DIAGNOSIS — E1051 Type 1 diabetes mellitus with diabetic peripheral angiopathy without gangrene: Secondary | ICD-10-CM

## 2021-07-10 MED ORDER — DEXCOM G6 SENSOR MISC: 0 refills | Status: DC

## 2021-07-10 MED ORDER — DEXCOM G6 TRANSMITTER MISC: 0 refills | Status: DC

## 2021-08-12 ENCOUNTER — Other Ambulatory Visit: Payer: Self-pay

## 2021-08-12 MED ORDER — DONEPEZIL HCL 10 MG PO TABS: 10.0000 mg | ORAL_TABLET | Freq: Every day | ORAL | 3 refills | Status: DC

## 2021-08-12 MED ORDER — MEMANTINE HCL 10 MG PO TABS: ORAL_TABLET | ORAL | 1 refills | Status: DC

## 2021-08-12 NOTE — Progress Notes (Signed)
Rx refilled.

## 2021-08-29 ENCOUNTER — Other Ambulatory Visit: Payer: Self-pay

## 2021-08-29 DIAGNOSIS — E1051 Type 1 diabetes mellitus with diabetic peripheral angiopathy without gangrene: Secondary | ICD-10-CM

## 2021-08-29 MED ORDER — DEXCOM G6 RECEIVER DEVI: 1 refills | Status: AC

## 2021-08-29 MED ORDER — DEXCOM G6 TRANSMITTER MISC: 0 refills | Status: DC

## 2021-08-29 MED ORDER — DEXCOM G6 SENSOR MISC: 0 refills | Status: DC

## 2021-09-25 ENCOUNTER — Other Ambulatory Visit: Payer: Self-pay

## 2021-09-25 DIAGNOSIS — E1051 Type 1 diabetes mellitus with diabetic peripheral angiopathy without gangrene: Secondary | ICD-10-CM

## 2021-09-25 MED ORDER — NOVOLIN 70/30 (70-30) 100 UNIT/ML ~~LOC~~ SUSP: 27.0000 [IU] | Freq: Every day | SUBCUTANEOUS | 11 refills | Status: DC

## 2021-09-25 MED ORDER — DEXCOM G6 SENSOR MISC: 0 refills | Status: DC

## 2021-09-25 MED ORDER — "BD SAFETYGLIDE INSULIN SYRINGE 31G X 15/64"" 0.5 ML MISC": 1.0000 | Freq: Two times a day (BID) | 2 refills | Status: AC

## 2021-09-25 MED ORDER — DEXCOM G6 TRANSMITTER MISC: 0 refills | Status: DC

## 2021-10-07 ENCOUNTER — Other Ambulatory Visit (HOSPITAL_COMMUNITY): Payer: Self-pay

## 2021-10-13 ENCOUNTER — Other Ambulatory Visit: Payer: Self-pay | Admitting: Internal Medicine

## 2021-10-13 DIAGNOSIS — E1051 Type 1 diabetes mellitus with diabetic peripheral angiopathy without gangrene: Secondary | ICD-10-CM

## 2021-10-14 ENCOUNTER — Telehealth: Payer: Self-pay | Admitting: Internal Medicine

## 2021-10-14 NOTE — Telephone Encounter (Signed)
Patient called and needs refills of Novo 730, Novolog R and Dexcom G6 to [facility] on [address]. MEDICATION: Novo 730, Novolog R , Decom G6  PHARMACY:  [facility] on [address]  HAS THE PATIENT CONTACTED THEIR PHARMACY?  ?  IS THIS A 90 DAY SUPPLY : Unknown  IS PATIENT OUT OF MEDICATION: Unknown  IF NOT; HOW MUCH IS LEFT:   LAST APPOINTMENT DATE: @7 /31/2023  NEXT APPOINTMENT DATE:@10 /16/2023  DO WE HAVE YOUR PERMISSION TO LEAVE A DETAILED MESSAGE?:Unknown  OTHER COMMENTS:    **Let patient know to contact pharmacy at the end of the day to make sure medication is ready. **  ** Please notify patient to allow 48-72 hours to process**  **Encourage patient to contact the pharmacy for refills or they can request refills through [facility]**

## 2021-10-15 NOTE — Telephone Encounter (Signed)
Novolin 70/30 sent on 7/13 with refills, Dexcom G6 sent on 7/31. Patient also isnt currently taking Novolog R.

## 2021-10-21 ENCOUNTER — Other Ambulatory Visit: Payer: Self-pay

## 2021-10-21 ENCOUNTER — Other Ambulatory Visit: Payer: Self-pay | Admitting: Internal Medicine

## 2021-10-21 DIAGNOSIS — E1051 Type 1 diabetes mellitus with diabetic peripheral angiopathy without gangrene: Secondary | ICD-10-CM

## 2021-10-21 MED ORDER — NOVOLIN 70/30 (70-30) 100 UNIT/ML ~~LOC~~ SUSP: 27.0000 [IU] | Freq: Every day | SUBCUTANEOUS | 11 refills | Status: DC

## 2021-10-21 MED ORDER — DEXCOM G6 SENSOR MISC: 0 refills | Status: DC

## 2021-12-19 ENCOUNTER — Telehealth: Payer: Self-pay | Admitting: Internal Medicine

## 2021-12-19 ENCOUNTER — Other Ambulatory Visit: Payer: Self-pay | Admitting: Internal Medicine

## 2021-12-19 MED ORDER — INSULIN REGULAR HUMAN 100 UNIT/ML IJ SOLN: 5.0000 [IU] | Freq: Three times a day (TID) | INTRAMUSCULAR | 3 refills | Status: AC

## 2021-12-19 NOTE — Telephone Encounter (Signed)
Spoke with the patient to receive clarification. He states that he is currently taking 30 units of Novolin 70/30 and has about 2 vials left. He is also taking 5 units of Novolin R along with the 70/30 when his blood sugar is 200 and above. He states that he was previously buying Novolin R OTC but now [facility] requires a prescription to be sent in and he is currently out of the Novolin R . Please advise if you'd like to send a RX in.

## 2021-12-19 NOTE — Telephone Encounter (Signed)
Patient informed that RX has been sent and expressed his understanding that he may have to take a slightly higher dose

## 2021-12-19 NOTE — Telephone Encounter (Signed)
MEDICATION: Novolin-R insulin  PHARMACY:  [facility]  HAS THE PATIENT CONTACTED THEIR PHARMACY?  no  IS THIS A 90 DAY SUPPLY : unknown  IS PATIENT OUT OF MEDICATION: yes  IF NOT; HOW MUCH IS LEFT:   LAST APPOINTMENT DATE: @3 /15/2023  NEXT APPOINTMENT DATE:@10 /16/2023  DO WE HAVE YOUR PERMISSION TO LEAVE A DETAILED MESSAGE?:  OTHER COMMENTS:    **Let patient know to contact pharmacy at the end of the day to make sure medication is ready. **  ** Please notify patient to allow 48-72 hours to process**  **Encourage patient to contact the pharmacy for refills or they can request refills through [facility]**

## 2021-12-19 NOTE — Telephone Encounter (Signed)
A, I sent the Regular insulin Rx to [facility].  He may need to take a slightly higher dose of regular insulin if the sugars stay high.  It may be cheaper for him to get it outside insurance, but I will let him decide.

## 2021-12-23 NOTE — Telephone Encounter (Signed)
error 

## 2021-12-29 ENCOUNTER — Encounter: Payer: Self-pay | Admitting: Internal Medicine

## 2021-12-29 ENCOUNTER — Ambulatory Visit: Payer: Medicare HMO | Admitting: Internal Medicine

## 2021-12-29 ENCOUNTER — Other Ambulatory Visit: Payer: Self-pay | Admitting: Internal Medicine

## 2021-12-29 VITALS — BP 128/78 | HR 104 | Ht 66.0 in | Wt 131.2 lb

## 2021-12-29 DIAGNOSIS — E1165 Type 2 diabetes mellitus with hyperglycemia: Secondary | ICD-10-CM | POA: Diagnosis not present

## 2021-12-29 DIAGNOSIS — E7800 Pure hypercholesterolemia, unspecified: Secondary | ICD-10-CM

## 2021-12-29 DIAGNOSIS — E1051 Type 1 diabetes mellitus with diabetic peripheral angiopathy without gangrene: Secondary | ICD-10-CM

## 2021-12-29 DIAGNOSIS — E1159 Type 2 diabetes mellitus with other circulatory complications: Secondary | ICD-10-CM

## 2021-12-29 LAB — POCT GLYCOSYLATED HEMOGLOBIN (HGB A1C): Hemoglobin A1C: 8.1 % — AB (ref 4.0–5.6)

## 2021-12-29 MED ORDER — LANTUS SOLOSTAR 100 UNIT/ML ~~LOC~~ SOPN: 16.0000 [IU] | PEN_INJECTOR | Freq: Every day | SUBCUTANEOUS | 3 refills | Status: DC

## 2021-12-29 MED ORDER — INSULIN PEN NEEDLE 32G X 4 MM MISC: 3 refills | Status: AC

## 2021-12-29 MED ORDER — NOVOLOG FLEXPEN 100 UNIT/ML ~~LOC~~ SOPN: 5.0000 [IU] | PEN_INJECTOR | Freq: Three times a day (TID) | SUBCUTANEOUS | 3 refills | Status: DC

## 2021-12-29 NOTE — Patient Instructions (Addendum)
Please stop: - Novolin 70/30  Please use: - Lantus 16 units daily - Humalog 5-7 units 15 units before lunch and supper  Restart the Dexcom.  Please return in 1.5 months.  PATIENT INSTRUCTIONS FOR TYPE 2 DIABETES:  DIET AND EXERCISE Diet and exercise is an important part of diabetic treatment.  We recommended aerobic exercise in the form of brisk walking (working between 40-60% of maximal aerobic capacity, similar to brisk walking) for 150 minutes per week (such as 30 minutes five days per week) along with 3 times per week performing 'resistance' training (using various gauge rubber tubes with handles) 5-10 exercises involving the major muscle groups (upper body, lower body and core) performing 10-15 repetitions (or near fatigue) each exercise. Start at half the above goal but build slowly to reach the above goals. If limited by weight, joint pain, or disability, we recommend daily walking in a swimming pool with water up to waist to reduce pressure from joints while allow for adequate exercise.    BLOOD GLUCOSES Monitoring your blood glucoses is important for continued management of your diabetes. Please check your blood glucoses 2-4 times a day: fasting, before meals and at bedtime (you can rotate these measurements - e.g. one day check before the 3 meals, the next day check before 2 of the meals and before bedtime, etc.).   HYPOGLYCEMIA (low blood sugar) Hypoglycemia is usually a reaction to not eating, exercising, or taking too much insulin/ other diabetes drugs.  Symptoms include tremors, sweating, hunger, confusion, headache, etc. Treat IMMEDIATELY with 15 grams of Carbs: 4 glucose tablets  cup regular juice/soda 2 tablespoons raisins 4 teaspoons sugar 1 tablespoon honey Recheck blood glucose in 15 mins and repeat above if still symptomatic/blood glucose <100.  RECOMMENDATIONS TO REDUCE YOUR RISK OF DIABETIC COMPLICATIONS: * Take your prescribed MEDICATION(S) * Follow a  DIABETIC diet: Complex carbs, fiber rich foods, (monounsaturated and polyunsaturated) fats * AVOID saturated/trans fats, high fat foods, >2,300 mg salt per day. * EXERCISE at least 5 times a week for 30 minutes or preferably daily.  * DO NOT SMOKE OR DRINK more than 1 drink a day. * Check your FEET every day. Do not wear tightfitting shoes. Contact us if you develop an ulcer * See your EYE doctor once a year or more if needed * Get a FLU shot once a year * Get a PNEUMONIA vaccine once before and once after age 68 years  GOALS:  * Your Hemoglobin A1c of <7%  * fasting sugars need to be 80-130 * after meals sugars need to be <180 (2h after you start eating) * Your Systolic BP should be 580 or lower  * Your Diastolic BP should be 80 or lower  * Your HDL (Good Cholesterol) should be 40 or higher  * Your LDL (Bad Cholesterol) should be ideally <70. * Your Triglycerides should be 150 or lower  * Your Urine microalbumin (kidney function) should be <30 * Your Body Mass Index should be 25 or lower   Please consider the following ways to cut down carbs and fat and increase fiber and micronutrients in your diet: - substitute whole grain for white bread or pasta - substitute brown rice for white rice - substitute 90-calorie flat bread pieces for slices of bread when possible - substitute sweet potatoes or yams for white potatoes - substitute humus for margarine - substitute tofu for cheese when possible - substitute almond or rice milk for regular milk (would not drink soy milk  daily due to concern for soy estrogen influence on breast cancer risk) - substitute dark chocolate for other sweets when possible - substitute water - can add lemon or orange slices for taste - for diet sodas (artificial sweeteners will trick your body that you can eat sweets without getting calories and will lead you to overeating and weight gain in the long run) - do not skip breakfast or other meals (this will slow down  the metabolism and will result in more weight gain over time)  - can try smoothies made from fruit and almond/rice milk in am instead of regular breakfast - can also try old-fashioned (not instant) oatmeal made with almond/rice milk in am - order the dressing on the side when eating salad at a restaurant (pour less than half of the dressing on the salad) - eat as little meat as possible - can try juicing, but should not forget that juicing will get rid of the fiber, so would alternate with eating raw veg./fruits or drinking smoothies - use as little oil as possible, even when using olive oil - can dress a salad with a mix of balsamic vinegar and lemon juice, for e.g. - use agave nectar, stevia sugar, or regular sugar rather than artificial sweateners - steam or broil/roast veggies  - snack on veggies/fruit/nuts (unsalted, preferably) when possible, rather than processed foods - reduce or eliminate aspartame in diet (it is in diet sodas, chewing gum, etc) Read the labels!  Try to read Dr. Janene Harvey book: "Program for Reversing Diabetes" for other ideas for healthy eating.

## 2021-12-29 NOTE — Progress Notes (Unsigned)
Patient: Steven Sanders Date of Birth: Oct 17, 1940  Reason for Visit: Follow up for memory History from: Patient, niece  Primary Neurologist: Dr. Brett Sanders  ASSESSMENT AND PLAN 81 y.o. year old male   22.  Mild cognitive impairment, Dementia 2.  Moderate small vessel disease by MRI of brain 3.  Type I diabetic, sees endocrinology   -Decline in MMSE 21/30, history supports decline  -Unclear if he is taking Namenda, if not he will restart, 10 mg twice daily, starting with 1/2 tablet twice daily for 2 weeks then full dosing if tolerable -Continue Aricept 10 mg at bedtime -I have concerns about his driving, I have given paperwork to undergo a driving evaluation by OT -He is planning to sell his home, relocate to [place] to be closer to family -We will schedule a 31-month follow-up, if he does relocate to [place] his neurology care will be transferred  HISTORY OF PRESENT ILLNESS: Today 12/30/21 Steven Sanders is here today for follow-up. MMSE 21/30 today. Living alone, is driving. Denies any trouble getting lost, uses cell phone GPS, now more often, is comforting to him. He manages his household, pays the bills has had some trouble with this, poor organization of the desk. Manages his medications, saw endocrinology yesterday, made changes to pens, he wasn't checking his glucose, A1C is 8, he isn't sure he can use his new insulin pen. He eats 2 meals a day, he microwaves. He eats fruit. Sleeping well.  He takes Aricept, is not sure he is taking Namenda? Feels his memory is declining. Right now, is looking for real estate agent to sell him home here, move to [place]. He is walking less, most days watches TV. He enjoys reading.   Update 07/08/21 SS: Steven Sanders here today for follow-up. Remains on Aricept. MMSE 25/30 today. Lives alone, drives a car. Relies on cell phone GPS for directions. Manages his housework. Few times has been late paying a bill or paying twice, niece has offered to take  over bills. Mostly eats TV dinners. Manages his medications. Only eats 2 meals a day. He manages his insulin, does this well. Sleeps well. No falls. Goal is 5 miles a day, walks in his neighborhood. No health issues. Does reading and puzzles. He is isolated but likes it this way. All his family lives in [place]. Long term plans to relocate back if his medical condition were to decline.   HISTORY  01/07/2021 Dr. Jannifer Sanders: Steven Sanders is a 81 year old right-handed white male with a history of a memory disturbance.  The patient has diabetes and at least a moderate level of small vessel disease by MRI.  He is now on Aricept, he tolerates this medication well.  He lives alone, his niece from [place] is with him today.  The patient is still operating a motor vehicle, apparently he is doing well in this regard.  He keeps up with his own medications and appointments and pays the bills.  According to the patient, he does this well without problems.  He has not had any motor vehicle accidents or difficulty getting lost.  He uses his cell phone for GPS and to help remind him to take his medications.  He returns to this office for further evaluation.  He believes that he has been relatively stable since last seen.  REVIEW OF SYSTEMS: Out of a complete 14 system review of symptoms, the patient complains only of the following symptoms, and all other reviewed systems are negative.  See HPI  ALLERGIES: Allergies  Allergen Reactions   Neomycin Other (See Comments)    unknown   Tea Tree Oil Other (See Comments)    unknown   Simvastatin    Sulfa Antibiotics Other (See Comments)    unknown   Other Rash    Chlorhexidine gluconate    HOME MEDICATIONS: Outpatient Medications Prior to Visit  Medication Sig Dispense Refill   Accu-Chek Softclix Lancets lancets 1 each by Other route 4 (four) times daily. Use to monitor glucose levels four times daily; E10.51 100 each 3   Alcohol Swabs (B-D SINGLE USE SWABS  REGULAR) PADS 1 each by Does not apply route 4 (four) times daily. Use to cleanse site prior to obtaining droplet for glucose monitoring; E10.51 100 each 3   aspirin 81 MG tablet Take 81 mg by mouth daily. Reported on 04/22/2015     Blood Glucose Calibration (ACCU-CHEK AVIVA) SOLN 1 each by In Vitro route as needed. Use to calibrate meter prn; E10.51 1 each 0   Blood Glucose Monitoring Suppl (ACCU-CHEK AVIVA PLUS) w/Device KIT 1 each by Other route 4 (four) times daily. Use to monitor glucose levels four times daily; E10.51 1 kit 0   cilostazol (PLETAL) 100 MG tablet TAKE 1 TABLET TWICE DAILY BEFORE MEALS 180 tablet 3   Continuous Blood Gluc Receiver (DEXCOM G6 RECEIVER) DEVI Use as directed or needed 1 each 1   Continuous Blood Gluc Sensor (DEXCOM G6 SENSOR) MISC CHANGE SENSOR EVERY 10 DAYS AS DIRECTED 3 each 0   Continuous Blood Gluc Transmit (DEXCOM G6 TRANSMITTER) MISC USE AS DIRECTED 1 each 0   DROPLET INSULIN SYRINGE 31G X 5/16" 0.5 ML MISC USE TO INJECT INSULIN 2 TIMES PER DAY 200 each 2   glucose blood (ACCU-CHEK AVIVA PLUS) test strip 1 each by Other route 4 (four) times daily. Use to monitor glucose levels four times daily; E10.51 100 each 3   insulin aspart (NOVOLOG FLEXPEN) 100 UNIT/ML FlexPen Inject 5-7 Units into the skin 3 (three) times daily with meals. 15 mL 3   insulin glargine (LANTUS SOLOSTAR) 100 UNIT/ML Solostar Pen Inject 16 Units into the skin at bedtime. 15 mL 3   Insulin Pen Needle 32G X 4 MM MISC Use 3x a day 100 each 3   Insulin Syringe-Needle U-100 (BD SAFETYGLIDE INSULIN SYRINGE) 31G X 15/64" 0.5 ML MISC 1 each by Does not apply route 2 (two) times daily. 180 each 2   lisinopril (PRINIVIL,ZESTRIL) 5 MG tablet Take 5 mg by mouth daily.     pantoprazole (PROTONIX) 40 MG tablet      PRESCRIPTION MEDICATION Insulin-R if needed.     rosuvastatin (CRESTOR) 40 MG tablet Take 40 mg by mouth daily.     donepezil (ARICEPT) 10 MG tablet Take 1 tablet (10 mg total) by mouth at  bedtime. 90 tablet 3   memantine (NAMENDA) 10 MG tablet Start taking 1/2 tablet twice daily x 2 weeks, then take 1 tablet twice daily 180 tablet 1   insulin regular (NOVOLIN R RELION) 100 units/mL injection Inject 0.05-0.1 mLs (5-10 Units total) into the skin 3 (three) times daily before meals. (Patient not taking: Reported on 12/30/2021) 20 mL 3   No facility-administered medications prior to visit.    PAST MEDICAL HISTORY: Past Medical History:  Diagnosis Date   COPD (chronic obstructive pulmonary disease) (HCC)    stopped smoking 5 years ago   Diabetes mellitus    GERD (gastroesophageal reflux disease)    Hypercholesterolemia  Hypertension    Memory difficulty 09/06/2018   Peripheral vascular disease ([place])     PAST SURGICAL HISTORY: Past Surgical History:  Procedure Laterality Date   AORTA - BILATERAL FEMORAL ARTERY BYPASS GRAFT N/A 08/11/2013   Procedure: AORTA BIFEMORAL BYPASS GRAFT;  Surgeon: Serafina Mitchell, MD;  Location: [facility];  Service: Vascular;  Laterality: N/A;   COLONOSCOPY     COLONOSCOPY W/ BIOPSIES AND POLYPECTOMY  2008   HERNIA REPAIR      FAMILY HISTORY: Family History  Problem Relation Age of Onset   Hyperlipidemia Mother    Cancer Mother    Hyperlipidemia Father    Cancer Father    Diabetes Neg Hx     SOCIAL HISTORY: Social History   Socioeconomic History   Marital status: Widowed    Spouse name: Not on file   Number of children: Not on file   Years of education: Not on file   Highest education level: Not on file  Occupational History   Occupation: Retired  Tobacco Use   Smoking status: Former    Packs/day: 1.00    Years: 50.00    Total pack years: 50.00    Types: Cigarettes    Quit date: 01/14/2009    Years since quitting: 12.9   Smokeless tobacco: Never  Vaping Use   Vaping Use: Never used  Substance and Sexual Activity   Alcohol use: Yes    Alcohol/week: 7.0 - 14.0 standard drinks of alcohol    Types: 7 - 14 Shots of liquor per  week    Comment: occasionally beer as well. 1-2 scotch per day   Drug use: No   Sexual activity: Not on file  Other Topics Concern   Not on file  Social History Narrative   Right Handed    Lives at home alone   Caffeine 5 cups daily   Social Determinants of Health   Financial Resource Strain: Not on file  Food Insecurity: No Food Insecurity (10/20/2019)   Hunger Vital Sign    Worried About Running Out of Food in the Last Year: Never true    [place] in the Last Year: Never true  Transportation Needs: No Transportation Needs (10/20/2019)   PRAPARE - Hydrologist (Medical): No    Lack of Transportation (Non-Medical): No  Physical Activity: Not on file  Stress: Not on file  Social Connections: Not on file  Intimate Partner Violence: Not on file   PHYSICAL EXAM  Vitals:   12/30/21 1027  BP: 128/63  Pulse: (!) 106  Weight: 132 lb (59.9 kg)  Height: 5\' 6"  (1.676 m)    Body mass index is 21.31 kg/m.    12/30/2021   10:30 AM 07/08/2021    8:23 AM 01/07/2021    9:15 AM  MMSE - Mini Mental State Exam  Orientation to time 1 3 2   Orientation to Place 3 5 4   Registration 3 3 3   Attention/ Calculation 5 4 3   Recall 0 1 3  Language- name 2 objects 2 2 2   Language- repeat 1 1 1   Language- follow 3 step command 3 3 3   Language- read & follow direction 1 1 1   Write a sentence 1 1 1   Copy design 1 1 0  Total score 21 25 23    Generalized: Well developed, in no acute distress, well-groomed, dressed Neurological examination  Mentation: Alert, provides some inaccurate history corrected by his family member, he is cooperative  follows all commands speech and language fluent, very pleasant Cranial nerve II-XII: Pupils were equal round reactive to light. Extraocular movements were full, visual field were full on confrontational test. Facial sensation and strength were normal. Head turning and shoulder shrug  were normal and symmetric. Motor: The motor  testing reveals 5 over 5 strength of all 4 extremities. Good symmetric motor tone is noted throughout.  Sensory: Sensory testing is intact to soft touch on all 4 extremities. No evidence of extinction is noted.  Coordination: Cerebellar testing reveals good finger-nose-finger and heel-to-shin bilaterally.  Gait and station: Gait is normal.  Steady and independent. Reflexes: Deep tendon reflexes are symmetric and normal bilaterally.   DIAGNOSTIC DATA (LABS, IMAGING, TESTING) - I reviewed patient records, labs, notes, testing and imaging myself where available.  Lab Results  Component Value Date   WBC 11.2 (H) 08/16/2013   HGB 9.6 (L) 08/16/2013   HCT 28.7 (L) 08/16/2013   MCV 83.2 08/16/2013   PLT 219 08/16/2013      Component Value Date/Time   NA 148 (H) 08/16/2013 0448   K 3.1 (L) 08/16/2013 0448   CL 111 08/16/2013 0448   CO2 25 08/16/2013 0448   GLUCOSE 81 08/16/2013 0448   BUN 14 08/16/2013 0448   CREATININE 0.55 08/16/2013 0448   CREATININE 0.81 05/26/2013 1440   CALCIUM 8.4 08/16/2013 0448   PROT 5.2 (L) 08/12/2013 0357   ALBUMIN 3.3 (L) 08/12/2013 0357   AST 33 08/12/2013 0357   ALT 26 08/12/2013 0357   ALKPHOS 50 08/12/2013 0357   BILITOT 0.8 08/12/2013 0357   GFRNONAA >90 08/16/2013 0448   GFRAA >90 08/16/2013 0448   No results found for: "CHOL", "HDL", "LDLCALC", "LDLDIRECT", "TRIG", "CHOLHDL" Lab Results  Component Value Date   HGBA1C 8.1 (A) 12/29/2021   Lab Results  Component Value Date   [id number] 694 02/24/2018   No results found for: "TSH"  Butler Denmark, Laqueta Jean, DNP 12/30/2021, 12:46 PM [facility] [address] [place], [place] [address] [phone]

## 2021-12-29 NOTE — Progress Notes (Signed)
Patient ID: Steven Sanders, male   DOB: May 16, 1940, 81 y.o.   MRN: [id number]  HPI: Steven Sanders is a 81 y.o.-year-old male, returning for follow-up for DM1, dx in 1960, insulin-dependent, fairly well controlled, with complications (PAD, PN, DR). Pt. previously saw Dr. Loanne Drilling, last visit 7 mo ago.  He is here with his daughter, who offers part of the history as patient has memory loss.  Reviewed HbA1c: Lab Results  Component Value Date   HGBA1C 6.8 (A) 05/28/2021   HGBA1C 6.9 (A) 01/08/2021   HGBA1C 6.9 (A) 09/12/2020   HGBA1C 7.0 (A) 06/13/2020   HGBA1C 6.4 (A) 11/01/2019   HGBA1C 6.6 (A) 07/27/2019   HGBA1C 6.4 (A) 04/21/2019   HGBA1C 6.6 (A) 03/03/2019   HGBA1C 6.7 (A) 11/02/2018   HGBA1C 7.1 (A) 04/20/2018   Pt is on a regimen of: - Novolin 70/30 30 units at 9 am, but skips b'fast - R insulin 5 units for correction of CBGs >200 -usually takes it in the afternoon. He was previously on an insulin pump, which he discontinued in 2020.  Pt was checking his sugars >4x a day with his Dexcom CGM >> did not like it -stopped it since last visit with Dr. Loanne Drilling.  He is not checking blood sugars now. - am: after taking insulin: n/c - 2h after b'fast: n/c - before lunch: n/c - 2h after lunch: n/c - before dinner: n/c - 2h after dinner: n/c - bedtime: n/c - nighttime: n/c Lowest sugar was 60; he has hypoglycemia awareness at 50s.  He has a history of multiple hypoglycemic episodes in the past, most recently in 2015. Highest sugar was 210.  Glucometer: Accu-Chek  Pt's meals are: - Breakfast: skips - Lunch: bread + meat + salad; open sandwich - Dinner: same  - Snacks: a lot: apples, grapes  - no CKD, last BUN/creatinine:  09/18/2020: 15/0.91, GFR 86, ACR 125 Lab Results  Component Value Date   BUN 14 08/16/2013   BUN 19 08/15/2013   CREATININE 0.55 08/16/2013   CREATININE 0.62 08/15/2013  On lisinopril 5 mg daily  -+ HL; last set of lipids: 09/18/2020: 172/59/83/78 No  results found for: "CHOL", "HDL", "LDLCALC", "LDLDIRECT", "TRIG", "CHOLHDL" On Crestor 40 mg daily.  - last eye exam was in ?.+ DR.   - + numbness and tingling in his feet.  Last foot exam 09/12/2020.  Patient also has a history of HTN, COPD, GERD, memory difficulties.  ROS: + see HPI No increased urination, blurry vision, nausea, chest pain.  Past Medical History:  Diagnosis Date   COPD (chronic obstructive pulmonary disease) ([place])    stopped smoking 5 years ago   Diabetes mellitus    GERD (gastroesophageal reflux disease)    Hypercholesterolemia    Hypertension    Memory difficulty 09/06/2018   Peripheral vascular disease ([place])    Past Surgical History:  Procedure Laterality Date   AORTA - BILATERAL FEMORAL ARTERY BYPASS GRAFT N/A 08/11/2013   Procedure: AORTA BIFEMORAL BYPASS GRAFT;  Surgeon: Serafina Mitchell, MD;  Location: [facility];  Service: Vascular;  Laterality: N/A;   COLONOSCOPY     COLONOSCOPY W/ BIOPSIES AND POLYPECTOMY  2008   HERNIA REPAIR     Social History   Socioeconomic History   Marital status: Widowed    Spouse name: Not on file   Number of children: Not on file   Years of education: Not on file   Highest education level: Not on file  Occupational History  Occupation: Retired  Tobacco Use   Smoking status: Former    Packs/day: 1.00    Years: 50.00    Total pack years: 50.00    Types: Cigarettes    Quit date: 01/14/2009    Years since quitting: 12.9   Smokeless tobacco: Never  Vaping Use   Vaping Use: Never used  Substance and Sexual Activity   Alcohol use: Yes    Alcohol/week: 7.0 - 14.0 standard drinks of alcohol    Types: 7 - 14 Shots of liquor per week    Comment: occasionally beer as well. 1-2 scotch per day   Drug use: No   Sexual activity: Not on file  Other Topics Concern   Not on file  Social History Narrative   Right Handed    Lives at home alone   Caffeine 5 cups daily   Social Determinants of Health   Financial Resource  Strain: Not on file  Food Insecurity: No Food Insecurity (10/20/2019)   Hunger Vital Sign    Worried About Running Out of Food in the Last Year: Never true    [place] in the Last Year: Never true  Transportation Needs: No Transportation Needs (10/20/2019)   PRAPARE - Hydrologist (Medical): No    Lack of Transportation (Non-Medical): No  Physical Activity: Not on file  Stress: Not on file  Social Connections: Not on file  Intimate Partner Violence: Not on file   Current Outpatient Medications on File Prior to Visit  Medication Sig Dispense Refill   Accu-Chek Softclix Lancets lancets 1 each by Other route 4 (four) times daily. Use to monitor glucose levels four times daily; E10.51 100 each 3   Alcohol Swabs (B-D SINGLE USE SWABS REGULAR) PADS 1 each by Does not apply route 4 (four) times daily. Use to cleanse site prior to obtaining droplet for glucose monitoring; E10.51 100 each 3   aspirin 81 MG tablet Take 81 mg by mouth daily. Reported on 04/22/2015     Blood Glucose Calibration (ACCU-CHEK AVIVA) SOLN 1 each by In Vitro route as needed. Use to calibrate meter prn; E10.51 1 each 0   Blood Glucose Monitoring Suppl (ACCU-CHEK AVIVA PLUS) w/Device KIT 1 each by Other route 4 (four) times daily. Use to monitor glucose levels four times daily; E10.51 1 kit 0   cilostazol (PLETAL) 100 MG tablet TAKE 1 TABLET TWICE DAILY BEFORE MEALS 180 tablet 3   Continuous Blood Gluc Receiver (DEXCOM G6 RECEIVER) DEVI Use as directed or needed 1 each 1   Continuous Blood Gluc Sensor (DEXCOM G6 SENSOR) MISC CHANGE SENSOR EVERY 10 DAYS AS DIRECTED 3 each 0   Continuous Blood Gluc Transmit (DEXCOM G6 TRANSMITTER) MISC USE AS DIRECTED 1 each 0   donepezil (ARICEPT) 10 MG tablet Take 1 tablet (10 mg total) by mouth at bedtime. 90 tablet 3   DROPLET INSULIN SYRINGE 31G X 5/16" 0.5 ML MISC USE TO INJECT INSULIN 2 TIMES PER DAY 200 each 2   glucose blood (ACCU-CHEK AVIVA PLUS) test  strip 1 each by Other route 4 (four) times daily. Use to monitor glucose levels four times daily; E10.51 100 each 3   insulin NPH-regular Human (NOVOLIN 70/30) (70-30) 100 UNIT/ML injection Inject 27 Units into the skin daily with breakfast. 30 mL 11   insulin regular (NOVOLIN R RELION) 100 units/mL injection Inject 0.05-0.1 mLs (5-10 Units total) into the skin 3 (three) times daily before meals. 20 mL 3  Insulin Syringe-Needle U-100 (BD SAFETYGLIDE INSULIN SYRINGE) 31G X 15/64" 0.5 ML MISC 1 each by Does not apply route 2 (two) times daily. 180 each 2   lisinopril (PRINIVIL,ZESTRIL) 5 MG tablet Take 5 mg by mouth daily.     memantine (NAMENDA) 10 MG tablet Start taking 1/2 tablet twice daily x 2 weeks, then take 1 tablet twice daily 180 tablet 1   pantoprazole (PROTONIX) 40 MG tablet      PRESCRIPTION MEDICATION Insulin-R if needed.     rosuvastatin (CRESTOR) 40 MG tablet Take 40 mg by mouth daily.     No current facility-administered medications on file prior to visit.   Allergies  Allergen Reactions   Neomycin Other (See Comments)    unknown   Tea Tree Oil Other (See Comments)    unknown   Simvastatin    Sulfa Antibiotics Other (See Comments)    unknown   Other Rash    Chlorhexidine gluconate   Family History  Problem Relation Age of Onset   Hyperlipidemia Mother    Cancer Mother    Hyperlipidemia Father    Cancer Father    Diabetes Neg Hx    PE: BP 128/78 (BP Location: Left Arm, Patient Position: Sitting, Cuff Size: Normal)   Pulse (!) 104   Ht _0  (1.676 m)   Wt 131 lb 3.2 oz (59.5 kg)   SpO2 95%   BMI 21.18 kg/m  Wt Readings from Last 3 Encounters:  12/29/21 131 lb 3.2 oz (59.5 kg)  07/08/21 138 lb (62.6 kg)  05/28/21 141 lb 12.8 oz (64.3 kg)   Constitutional: normal weight, in NAD Eyes: no exophthalmos ENT: no thyromegaly, no cervical lymphadenopathy Cardiovascular: RRR, No MRG Respiratory: CTA B Musculoskeletal: no deformities Skin: moist, warm, no  rashes Neurological: no tremor with outstretched hands  ASSESSMENT: 1. DM1, insulin-dependent, controlled, with complications - Hypoglycemia - PAD - s/p femoral artery bypass graft - PN - DR  2. HL  PLAN:  1. Patient with long-standing, uncontrolled diabetes, on injectable antidiabetic regimen with Novolin 70/30, with good control.  Latest HbA1c obtained at last visit with Dr. Loanne Drilling was 6.8% (05/2021).  At today's visit, HbA1c, however, HbA1c is higher, at 8.1%. -At today's visit, he does not have his Dexcom attached and he is not checking blood sugars.  He is taking premixed insulin in the morning, without eating afterwards.  He eats approximately 3.5 hours after taking the insulin.  We discussed that this is dangerous practice.  Since he has type 1 diabetes, he absolutely needs to take insulin and check his blood sugars.  However, taking 70/30 insulin without eating may drop his blood sugars too low so we discussed about stopping this insulin and switching to a basal/bolus insulin regimen, more suitable to his needs.  Patient and his daughter agree to a long-acting insulin + rapid acting insulin before lunch and dinner.  He has approximately the same foods for lunch as for dinner so I advised him to take approximately the same amount of rapid acting insulin.  We discussed that I am not sure which insulins will be covered for him but I sent the prescription for Lantus and NovoLog to his pharmacy.  He previously had vials but the pens would be much easier for him to use so I advised him to try to do so. -I strongly advised him to attach the CGM again.  He reluctantly agrees. -He appears to have significant memory loss and is repetitive.  I am  worried that his insulin may be complicated for him, but allowing him to take 70/30 insulin and not eating afterwards is quite dangerous.  I am hoping that the Dexcom will help with safety, giving him alarms. - I suggested to:  Patient Instructions  Please  stop: - Novolin 70/30  Please use: - Lantus 16 units daily - Humalog 5-7 units 15 units before lunch and supper  Restart the Dexcom.  Please return in 1.5 months.  - check sugars at different times of the day - check 4x a day, rotating checks - discussed about CBG targets for treatment: 80-130 mg/dL before meals and <180 mg/dL after meals; target HbA1c <7%. - given foot care handout  - will need a foot exam at next visit - given instructions for hypoglycemia management "15-15 rule"  - advised for yearly eye exams - he is due - Return to clinic in 1.5 mo  2. HL - I do not have a recent lipid panel to review - these are usually done by Dr. Marisue Humble - will try to get records, otherwise, we will check his lipid panel at next visit - reviewed his lipids from 09/18/2020: 172/59/83/78 -LDL was slightly higher than our target of less than 55 due to history of cardiovascular disease No results found for: "CHOL", "HDL", "LDLCALC", "LDLDIRECT", "TRIG", "CHOLHDL" - Continues Crestor 40 mg daily without side effects.  - Total time spent for the visit: 40 min, in precharting, reviewing Dr. Cordelia Pen last note, obtaining medical information from the chart and from the pt. And his daughter, reviewing his  previous labs, evaluations, and treatments, reviewing his symptoms, counseling him about his diabetes (please see the discussed topics above), and developing a plan to further treat it; he had a number of questions which I addressed  Philemon Kingdom, MD PhD [facility]

## 2021-12-30 ENCOUNTER — Encounter: Payer: Self-pay | Admitting: Internal Medicine

## 2021-12-30 ENCOUNTER — Encounter: Payer: Self-pay | Admitting: Neurology

## 2021-12-30 ENCOUNTER — Ambulatory Visit: Payer: Medicare HMO | Admitting: Neurology

## 2021-12-30 DIAGNOSIS — F03B0 Unspecified dementia, moderate, without behavioral disturbance, psychotic disturbance, mood disturbance, and anxiety: Secondary | ICD-10-CM

## 2021-12-30 DIAGNOSIS — F0390 Unspecified dementia without behavioral disturbance: Secondary | ICD-10-CM | POA: Insufficient documentation

## 2021-12-30 MED ORDER — DONEPEZIL HCL 10 MG PO TABS: 10.0000 mg | ORAL_TABLET | Freq: Every day | ORAL | 3 refills | Status: AC

## 2021-12-30 MED ORDER — MEMANTINE HCL 10 MG PO TABS: ORAL_TABLET | ORAL | 1 refills | Status: AC

## 2021-12-30 NOTE — Patient Instructions (Signed)
Check to see if you are taking the Namenda, we do recommending taking for your memory Continue the Aricept  Would recommend against driving at this point, I will give you paperwork to check into driving evaluation  See you back in 6 months

## 2021-12-31 ENCOUNTER — Ambulatory Visit: Payer: Medicare HMO

## 2021-12-31 NOTE — Telephone Encounter (Signed)
Patient called today and is scheduled for today at 10:00 AM for instruction on how to use the new pen.

## 2022-01-01 ENCOUNTER — Telehealth: Payer: Self-pay | Admitting: Internal Medicine

## 2022-01-01 DIAGNOSIS — E1159 Type 2 diabetes mellitus with other circulatory complications: Secondary | ICD-10-CM

## 2022-01-01 MED ORDER — INSULIN ASPART 100 UNIT/ML IJ SOLN: 8.0000 [IU] | Freq: Three times a day (TID) | INTRAMUSCULAR | 1 refills | Status: DC

## 2022-01-01 MED ORDER — INSULIN GLARGINE 100 UNIT/ML ~~LOC~~ SOLN: 16.0000 [IU] | Freq: Every day | SUBCUTANEOUS | 2 refills | Status: DC

## 2022-01-01 NOTE — Telephone Encounter (Signed)
Steven Sanders states that Esias does not do well with change as far as changing syringe to pen.

## 2022-01-01 NOTE — Telephone Encounter (Signed)
Pt contacted and advised of provider recommendation: Let's send vials instead (for both insulins), if the prefer, but we should also increase the insulin doses:  - Lantus 16 >> 22 units daily  - Humalog 5-7 >> 8-10 units 15 units before lunch and supper   Please let us know how this goes.  Ty!  C  My chart message sent to pt as well.

## 2022-01-01 NOTE — Telephone Encounter (Signed)
Patient's niece, Eartha Inch, is calling saying that since his insulin was changed on Monday at his visit that his blood sugar is continually rising .  Manuela Schwartz states that they had an awful night.  Manuela Schwartz states that 1:45 AM his blood sugar 242 and patient took 16 units of Lantis and at 7:00 AM his blood sugar was 247 according to Dexcom.  Patient has not taken any more insulin this morning.  Manuela Schwartz says that the [facility] was recalibrated yesterday and at 6:45 PM yesterday that they did a finger stick and at that time his sugar level was 400, so he did 6 units of Novolog.  At 8:00PM another fingerstick was done and the sugar level was 419.  They waited and at 9:30PM  sugar level was down to 365 and at 9:30 he took another 6 units of Novolog.  At 11:30PM sugar level by finger stick 285, so he took 5 more units of Novolog.

## 2022-01-06 ENCOUNTER — Encounter: Payer: Self-pay | Admitting: Internal Medicine

## 2022-01-07 ENCOUNTER — Other Ambulatory Visit: Payer: Self-pay | Admitting: Internal Medicine

## 2022-01-07 DIAGNOSIS — E1159 Type 2 diabetes mellitus with other circulatory complications: Secondary | ICD-10-CM

## 2022-01-16 DIAGNOSIS — G629 Polyneuropathy, unspecified: Secondary | ICD-10-CM | POA: Diagnosis not present

## 2022-01-16 DIAGNOSIS — F0390 Unspecified dementia without behavioral disturbance: Secondary | ICD-10-CM | POA: Diagnosis not present

## 2022-01-16 DIAGNOSIS — E1149 Type 2 diabetes mellitus with other diabetic neurological complication: Secondary | ICD-10-CM | POA: Diagnosis not present

## 2022-01-16 DIAGNOSIS — R748 Abnormal levels of other serum enzymes: Secondary | ICD-10-CM | POA: Diagnosis not present

## 2022-01-16 DIAGNOSIS — E7800 Pure hypercholesterolemia, unspecified: Secondary | ICD-10-CM | POA: Diagnosis not present

## 2022-01-16 DIAGNOSIS — E1129 Type 2 diabetes mellitus with other diabetic kidney complication: Secondary | ICD-10-CM | POA: Diagnosis not present

## 2022-01-16 DIAGNOSIS — I700 Atherosclerosis of aorta: Secondary | ICD-10-CM | POA: Diagnosis not present

## 2022-01-16 DIAGNOSIS — I739 Peripheral vascular disease, unspecified: Secondary | ICD-10-CM | POA: Diagnosis not present

## 2022-01-18 ENCOUNTER — Other Ambulatory Visit: Payer: Self-pay | Admitting: Internal Medicine

## 2022-01-18 DIAGNOSIS — E1051 Type 1 diabetes mellitus with diabetic peripheral angiopathy without gangrene: Secondary | ICD-10-CM

## 2022-01-22 ENCOUNTER — Other Ambulatory Visit: Payer: Self-pay | Admitting: Family Medicine

## 2022-01-22 DIAGNOSIS — R748 Abnormal levels of other serum enzymes: Secondary | ICD-10-CM

## 2022-01-29 ENCOUNTER — Ambulatory Visit
Admission: RE | Admit: 2022-01-29 | Discharge: 2022-01-29 | Disposition: A | Discharge status: Home / Self Care | Payer: Medicare HMO | Source: Ambulatory Visit | Attending: Family Medicine | Admitting: Family Medicine

## 2022-01-29 DIAGNOSIS — R748 Abnormal levels of other serum enzymes: Secondary | ICD-10-CM | POA: Diagnosis not present

## 2022-01-29 DIAGNOSIS — K8020 Calculus of gallbladder without cholecystitis without obstruction: Secondary | ICD-10-CM | POA: Diagnosis not present

## 2022-02-10 ENCOUNTER — Telehealth: Payer: Self-pay | Admitting: Neurology

## 2022-02-10 NOTE — Telephone Encounter (Signed)
Steven Sanders is calling. Stated she would like for a nurse to give her a called. Stated she just have some questions. She is requesting a call-back. Stated she would like [name] to give her a call.

## 2022-02-11 NOTE — Telephone Encounter (Signed)
Spoke with [name] verbally on this and obtained information for driver rehab.  Pt's niece requested I e-mail information to [email or web address], I have completed as requested.

## 2022-02-11 NOTE — Telephone Encounter (Addendum)
I called Steven Sanders back. She is in the processes of getting the pt into assisted living, she wanted NP to be aware of this.  She also sts at [facility] f/u pw was given for a driving evaluation through [facility] health and she has misplaced the forms. She is in need of another copy.  Will verify with NP what forms were given.

## 2022-02-18 NOTE — Telephone Encounter (Signed)
Pt niece is calling. Stated she never got the email on where to take pt for driving.

## 2022-02-20 ENCOUNTER — Encounter: Payer: Self-pay | Admitting: Internal Medicine

## 2022-02-20 DIAGNOSIS — E1159 Type 2 diabetes mellitus with other circulatory complications: Secondary | ICD-10-CM

## 2022-02-20 DIAGNOSIS — E1051 Type 1 diabetes mellitus with diabetic peripheral angiopathy without gangrene: Secondary | ICD-10-CM

## 2022-02-20 MED ORDER — INSULIN ASPART 100 UNIT/ML IJ SOLN: 8.0000 [IU] | Freq: Three times a day (TID) | INTRAMUSCULAR | 0 refills | Status: DC

## 2022-02-23 NOTE — Telephone Encounter (Signed)
I have sent pw again through e-mail. Please update pt's niece if not received please let her know she can pick up when she comes to town.  Thanks.

## 2022-02-27 MED ORDER — DEXCOM G6 TRANSMITTER MISC: 0 refills | Status: DC

## 2022-03-04 ENCOUNTER — Telehealth: Payer: Self-pay | Admitting: Neurology

## 2022-03-04 NOTE — Telephone Encounter (Signed)
I called the pt's niece back. She reports the pt has recently had loose bowels. Up until this point pt has tolerated med well.   I suggest he half the dosage of both namenda and donepezil  over the next 2-3 days to see if improvement with symptoms is noticeable.She is agreeable to this plan and will keep up to date on his status.

## 2022-03-04 NOTE — Telephone Encounter (Signed)
Pt's niece called stating that the pt's medications have been giving him diarrhea and she is wanting to know if she should have them stop taking them and if so should she just stop or does she need to wean him off of the.  Medications:  donepezil (ARICEPT) 10 MG tablet  memantine (NAMENDA) 10 MG tablet   Please advise.

## 2022-03-11 ENCOUNTER — Encounter: Payer: Self-pay | Admitting: Internal Medicine

## 2022-03-11 ENCOUNTER — Other Ambulatory Visit: Payer: Self-pay | Admitting: Internal Medicine

## 2022-03-11 DIAGNOSIS — E1165 Type 2 diabetes mellitus with hyperglycemia: Secondary | ICD-10-CM

## 2022-03-19 ENCOUNTER — Ambulatory Visit: Payer: Medicare HMO | Admitting: Internal Medicine

## 2022-03-19 ENCOUNTER — Encounter: Payer: Self-pay | Admitting: Internal Medicine

## 2022-03-19 VITALS — BP 118/68 | HR 65 | Ht 66.0 in | Wt 134.6 lb

## 2022-03-19 DIAGNOSIS — E7800 Pure hypercholesterolemia, unspecified: Secondary | ICD-10-CM | POA: Diagnosis not present

## 2022-03-19 DIAGNOSIS — E1151 Type 2 diabetes mellitus with diabetic peripheral angiopathy without gangrene: Secondary | ICD-10-CM

## 2022-03-19 DIAGNOSIS — G629 Polyneuropathy, unspecified: Secondary | ICD-10-CM | POA: Diagnosis not present

## 2022-03-19 DIAGNOSIS — E1051 Type 1 diabetes mellitus with diabetic peripheral angiopathy without gangrene: Secondary | ICD-10-CM | POA: Diagnosis not present

## 2022-03-19 DIAGNOSIS — F0390 Unspecified dementia without behavioral disturbance: Secondary | ICD-10-CM | POA: Diagnosis not present

## 2022-03-19 DIAGNOSIS — E1159 Type 2 diabetes mellitus with other circulatory complications: Secondary | ICD-10-CM

## 2022-03-19 DIAGNOSIS — I739 Peripheral vascular disease, unspecified: Secondary | ICD-10-CM | POA: Diagnosis not present

## 2022-03-19 DIAGNOSIS — E1165 Type 2 diabetes mellitus with hyperglycemia: Secondary | ICD-10-CM

## 2022-03-19 DIAGNOSIS — E1129 Type 2 diabetes mellitus with other diabetic kidney complication: Secondary | ICD-10-CM | POA: Diagnosis not present

## 2022-03-19 DIAGNOSIS — E1149 Type 2 diabetes mellitus with other diabetic neurological complication: Secondary | ICD-10-CM | POA: Diagnosis not present

## 2022-03-19 DIAGNOSIS — I700 Atherosclerosis of aorta: Secondary | ICD-10-CM | POA: Diagnosis not present

## 2022-03-19 DIAGNOSIS — Z1331 Encounter for screening for depression: Secondary | ICD-10-CM | POA: Diagnosis not present

## 2022-03-19 DIAGNOSIS — Z0000 Encounter for general adult medical examination without abnormal findings: Secondary | ICD-10-CM | POA: Diagnosis not present

## 2022-03-19 LAB — POCT GLYCOSYLATED HEMOGLOBIN (HGB A1C): Hemoglobin A1C: 7.6 % — AB (ref 4.0–5.6)

## 2022-03-19 MED ORDER — DEXCOM G6 SENSOR MISC: 3 refills | Status: AC

## 2022-03-19 MED ORDER — NOVOLOG 100 UNIT/ML IJ SOLN: INTRAMUSCULAR | 3 refills | Status: DC

## 2022-03-19 NOTE — Progress Notes (Signed)
Patient ID: Steven Sanders, male   DOB: October 08, 1940, 82 y.o.   MRN: [id number]  HPI: Steven Sanders is a 82 y.o.-year-old male, returning for follow-up for DM1, dx in 1980, insulin-dependent,uncontrolled, with complications (PAD, PN, DR). Pt. previously saw Dr. Loanne Drilling, but last visit with me 2.5 months ago.  He is here with his niece, who offers part of the history as patient has memory loss. This is his last appt - he will be moving to [place] to be close to prominent area at [place] living in an assisted living facility.  Interim history: No increased urination, blurry vision, nausea, chest pain.  Reviewed HbA1c: Lab Results  Component Value Date   HGBA1C 8.1 (A) 12/29/2021   HGBA1C 6.8 (A) 05/28/2021   HGBA1C 6.9 (A) 01/08/2021   HGBA1C 6.9 (A) 09/12/2020   HGBA1C 7.0 (A) 06/13/2020   HGBA1C 6.4 (A) 11/01/2019   HGBA1C 6.6 (A) 07/27/2019   HGBA1C 6.4 (A) 04/21/2019   HGBA1C 6.6 (A) 03/03/2019   HGBA1C 6.7 (A) 11/02/2018   At last visit he was on the following regimen: - Novolin 70/30 30 units at 9 am, but skips b'fast - R insulin 5 units for correction of CBGs >200 -usually takes it in the afternoon. He was previously on an insulin pump, which he discontinued in 2020.  We changed to: - Lantus 16 >> 22 units daily  - Humalog 5-7 >> 8-10 15 units before meals >> but actually taking 15 units at night in the morning and around dinnertime, without clear relationship to the meal Of note, he uses vials, as he is not comfortable using insulin pens.  Pt was checking his sugars >4x a day with his Dexcom CGM >> did not like it -stopped it since last visit with Dr. Loanne Drilling.  He was not checking sugars at last visit.  He was able to start the Dexcom CGM since last visit:   Lowest sugar was 60 >> 40; he has hypoglycemia awareness at 27s.  He has a history of multiple hypoglycemic episodes in the past, most recently in 2015. Highest sugar was 210 >> 400.  Glucometer: Accu-Chek  Pt's  meals are: - Breakfast: skips - Lunch: bread + meat + salad; open sandwich - Dinner: same  - Snacks: a lot: apples, grapes  - no CKD, last BUN/creatinine:  09/18/2020: 15/0.91, GFR 86, ACR 125 Lab Results  Component Value Date   BUN 14 08/16/2013   BUN 19 08/15/2013   CREATININE 0.55 08/16/2013   CREATININE 0.62 08/15/2013  On lisinopril 5 mg daily  -+ HL; last set of lipids: 09/18/2020: 172/59/83/78 No results found for: "CHOL", "HDL", "LDLCALC", "LDLDIRECT", "TRIG", "CHOLHDL" On Crestor 40 mg daily.  - last eye exam was in ?.+ DR.   - + numbness and tingling in his feet.  Last foot exam 09/12/2020.  Patient also has a history of HTN, COPD, GERD, memory difficulties.  ROS: + see HPI  Past Medical History:  Diagnosis Date   COPD (chronic obstructive pulmonary disease) ([place])    stopped smoking 5 years ago   Diabetes mellitus    GERD (gastroesophageal reflux disease)    Hypercholesterolemia    Hypertension    Memory difficulty 09/06/2018   Peripheral vascular disease ([place])    Past Surgical History:  Procedure Laterality Date   AORTA - BILATERAL FEMORAL ARTERY BYPASS GRAFT N/A 08/11/2013   Procedure: AORTA BIFEMORAL BYPASS GRAFT;  Surgeon: Serafina Mitchell, MD;  Location: [facility];  Service: Vascular;  Laterality: N/A;   COLONOSCOPY     COLONOSCOPY W/ BIOPSIES AND POLYPECTOMY  2008   HERNIA REPAIR     Social History   Socioeconomic History   Marital status: Widowed    Spouse name: Not on file   Number of children: Not on file   Years of education: Not on file   Highest education level: Not on file  Occupational History   Occupation: Retired  Tobacco Use   Smoking status: Former    Packs/day: 1.00    Years: 50.00    Total pack years: 50.00    Types: Cigarettes    Quit date: 01/14/2009    Years since quitting: 13.1   Smokeless tobacco: Never  Vaping Use   Vaping Use: Never used  Substance and Sexual Activity   Alcohol use: Yes    Alcohol/week: 7.0 - 14.0  standard drinks of alcohol    Types: 7 - 14 Shots of liquor per week    Comment: occasionally beer as well. 1-2 scotch per day   Drug use: No   Sexual activity: Not on file  Other Topics Concern   Not on file  Social History Narrative   Right Handed    Lives at home alone   Caffeine 5 cups daily   Social Determinants of Health   Financial Resource Strain: Not on file  Food Insecurity: No Food Insecurity (10/20/2019)   Hunger Vital Sign    Worried About Running Out of Food in the Last Year: Never true    [place] in the Last Year: Never true  Transportation Needs: No Transportation Needs (10/20/2019)   PRAPARE - Hydrologist (Medical): No    Lack of Transportation (Non-Medical): No  Physical Activity: Not on file  Stress: Not on file  Social Connections: Not on file  Intimate Partner Violence: Not on file   Current Outpatient Medications on File Prior to Visit  Medication Sig Dispense Refill   Accu-Chek Softclix Lancets lancets 1 each by Other route 4 (four) times daily. Use to monitor glucose levels four times daily; E10.51 100 each 3   Alcohol Swabs (B-D SINGLE USE SWABS REGULAR) PADS 1 each by Does not apply route 4 (four) times daily. Use to cleanse site prior to obtaining droplet for glucose monitoring; E10.51 100 each 3   aspirin 81 MG tablet Take 81 mg by mouth daily. Reported on 04/22/2015     Blood Glucose Calibration (ACCU-CHEK AVIVA) SOLN 1 each by In Vitro route as needed. Use to calibrate meter prn; E10.51 1 each 0   Blood Glucose Monitoring Suppl (ACCU-CHEK AVIVA PLUS) w/Device KIT 1 each by Other route 4 (four) times daily. Use to monitor glucose levels four times daily; E10.51 1 kit 0   cilostazol (PLETAL) 100 MG tablet TAKE 1 TABLET TWICE DAILY BEFORE MEALS 180 tablet 3   Continuous Blood Gluc Receiver (DEXCOM G6 RECEIVER) DEVI Use as directed or needed 1 each 1   Continuous Blood Gluc Sensor (DEXCOM G6 SENSOR) MISC CHANGE SENSOR EVERY  10 DAYS AS DIRECTED 9 each 1   Continuous Blood Gluc Transmit (DEXCOM G6 TRANSMITTER) MISC Replace every 3 months 1 each 0   donepezil (ARICEPT) 10 MG tablet Take 1 tablet (10 mg total) by mouth at bedtime. 90 tablet 3   DROPLET INSULIN SYRINGE 31G X 5/16" 0.5 ML MISC USE TO INJECT INSULIN 2 TIMES PER DAY 200 each 2   glucose blood (ACCU-CHEK AVIVA PLUS) test  strip 1 each by Other route 4 (four) times daily. Use to monitor glucose levels four times daily; E10.51 100 each 3   insulin glargine (LANTUS) 100 UNIT/ML injection Inject 0.16-0.22 mLs (16-22 Units total) into the skin daily. 20 mL 2   Insulin Pen Needle 32G X 4 MM MISC Use 3x a day 100 each 3   insulin regular (NOVOLIN R RELION) 100 units/mL injection Inject 0.05-0.1 mLs (5-10 Units total) into the skin 3 (three) times daily before meals. (Patient not taking: Reported on 12/30/2021) 20 mL 3   Insulin Syringe-Needle U-100 (BD SAFETYGLIDE INSULIN SYRINGE) 31G X 15/64" 0.5 ML MISC 1 each by Does not apply route 2 (two) times daily. 180 each 2   lisinopril (PRINIVIL,ZESTRIL) 5 MG tablet Take 5 mg by mouth daily.     memantine (NAMENDA) 10 MG tablet Take 1 tablet twice daily 180 tablet 1   NOVOLOG 100 UNIT/ML injection INJECT 8 TO 15 UNITS THREE TIMES DAILY BEFORE MEAL(S) 10 mL 0   pantoprazole (PROTONIX) 40 MG tablet      PRESCRIPTION MEDICATION Insulin-R if needed.     rosuvastatin (CRESTOR) 40 MG tablet Take 40 mg by mouth daily.     No current facility-administered medications on file prior to visit.   Allergies  Allergen Reactions   Neomycin Other (See Comments)    unknown   Tea Tree Oil Other (See Comments)    unknown   Simvastatin    Sulfa Antibiotics Other (See Comments)    unknown   Other Rash    Chlorhexidine gluconate   Family History  Problem Relation Age of Onset   Hyperlipidemia Mother    Cancer Mother    Hyperlipidemia Father    Cancer Father    Diabetes Neg Hx    PE: BP 118/68 (BP Location: Left Arm, Patient  Position: Sitting, Cuff Size: Normal)   Pulse 65   Ht _0  (1.676 m)   Wt 134 lb 9.6 oz (61.1 kg)   SpO2 95%   BMI 21.73 kg/m  Wt Readings from Last 3 Encounters:  03/19/22 134 lb 9.6 oz (61.1 kg)  12/30/21 132 lb (59.9 kg)  12/29/21 131 lb 3.2 oz (59.5 kg)   Constitutional: normal weight, in NAD Eyes: no exophthalmos ENT: no thyromegaly, no cervical lymphadenopathy Cardiovascular: RRR, No MRG Respiratory: CTA B Musculoskeletal: no deformities Skin:  no rashes Neurological: no tremor with outstretched hands  ASSESSMENT: 1. DM1, insulin-dependent, controlled, with complications - Hypoglycemia - PAD - s/p femoral artery bypass graft - PN - DR  2. HL  PLAN:  1. Patient with longstanding, uncontrolled, type 2 diabetes, on injectable antidiabetic regimen with basal-bolus insulin, changed from premixed 70/30 insulin at last visit.  At that time, HbA1c was higher, at 8.1%, and he was taking insulin incorrectly, without a relationship to the meals, not checking blood sugars.  We discussed that this is dangerous practice.  I recommended to restart his Dexcom, work, if he could not get it, to start checking manually.  I also recommended to change to Lantus and Humalog and discussed about how to take this correctly. - He appears to have significant memory loss and speech is repetitive.  His daughter is helping him with the medications.  He will moved to [place] soon. CGM interpretation: -At today's visit, we reviewed his CGM downloads: It appears that 45% of values are in target range (goal >70%), while 49% are higher than 180 (goal <25%), and 6% are lower than 70 (goal <  4%).  The calculated average blood sugar is 190.  The projected HbA1c for the next 3 months (GMI) is 7.9%. -Reviewing the CGM trends, sugars appear to be quite fluctuating, increasing significantly after his 11:30 AM meal brunch), then around 6 PM and finally after approximately 10 PM.  Upon questioning, he is taking his  NovoLog without relationship to the meals.  He occasionally still takes NovoLog at 9 AM, even though he eats his first meal of the day at 11:30 AM.  Also, he may take the full mealtime dose of insulin after the sugars are already high after meals.  This is conducive to significant postprandial hyperglycemia followed by significant drops in blood sugars.  Also, because he is occasionally lower at that time, he may not be able to take his Lantus, so hypoglycemia is seen throughout the next day.  At today's visit we evaluate all this trends and I discussed with his niece why it is important to take NovoLog only before he eats and in that case, 15 minutes before the meal.  We did discuss that if the sugars are low before bedtime, to correct them, and then to still inject the Lantus.  He also takes a fixed dose of NovoLog and we discussed that he will need to vary the dose based on the size and consistency of his meals. - I suggested to:  Patient Instructions  Please continue: - Lantus 22 units daily  - NovoLog/Humalog 8-15 units 15 units before lunch and supper  Please return to see me as needed.  - we checked his HbA1c: 7.6% (lower) - advised to check sugars at different times of the day - 4x a day, rotating check times - advised for yearly eye exams >> he is UTD - return to clinic in 3 months  2. HL -Reviewed latest lipid panel from 09/18/2020: LDL higher than our target of less than 55 due to history of cardiovascular disease: 172/59/83/78. No results found for: "CHOL", "HDL", "LDLCALC", "LDLDIRECT", "TRIG", "CHOLHDL" -He continues on Crestor 40 mg daily without side effects  Philemon Kingdom, MD PhD [facility] Endocrin[facility]

## 2022-03-19 NOTE — Patient Instructions (Addendum)
Please continue: - Lantus 22 units daily  - Novolog/Humalog 8-15 units 15 units before lunch and supper  Please return to see me as needed.

## 2022-04-01 ENCOUNTER — Ambulatory Visit: Payer: Medicare HMO | Admitting: Internal Medicine

## 2022-04-20 DIAGNOSIS — F03B0 Unspecified dementia, moderate, without behavioral disturbance, psychotic disturbance, mood disturbance, and anxiety: Secondary | ICD-10-CM | POA: Diagnosis not present

## 2022-04-20 DIAGNOSIS — E785 Hyperlipidemia, unspecified: Secondary | ICD-10-CM | POA: Diagnosis not present

## 2022-04-20 DIAGNOSIS — Z7689 Persons encountering health services in other specified circumstances: Secondary | ICD-10-CM | POA: Diagnosis not present

## 2022-04-20 DIAGNOSIS — E109 Type 1 diabetes mellitus without complications: Secondary | ICD-10-CM | POA: Diagnosis not present

## 2022-04-20 DIAGNOSIS — K219 Gastro-esophageal reflux disease without esophagitis: Secondary | ICD-10-CM | POA: Diagnosis not present

## 2022-05-07 DIAGNOSIS — H2513 Age-related nuclear cataract, bilateral: Secondary | ICD-10-CM | POA: Diagnosis not present

## 2022-05-07 DIAGNOSIS — E109 Type 1 diabetes mellitus without complications: Secondary | ICD-10-CM | POA: Diagnosis not present

## 2022-05-11 ENCOUNTER — Other Ambulatory Visit: Payer: Self-pay | Admitting: Internal Medicine

## 2022-05-11 ENCOUNTER — Encounter: Payer: Self-pay | Admitting: Internal Medicine

## 2022-05-11 DIAGNOSIS — E1051 Type 1 diabetes mellitus with diabetic peripheral angiopathy without gangrene: Secondary | ICD-10-CM

## 2022-05-18 DIAGNOSIS — Z0289 Encounter for other administrative examinations: Secondary | ICD-10-CM

## 2022-05-19 ENCOUNTER — Encounter: Payer: Self-pay | Admitting: Internal Medicine

## 2022-05-19 DIAGNOSIS — E1165 Type 2 diabetes mellitus with hyperglycemia: Secondary | ICD-10-CM

## 2022-05-19 MED ORDER — NOVOLOG 100 UNIT/ML IJ SOLN: 15.0000 [IU] | Freq: Three times a day (TID) | INTRAMUSCULAR | 0 refills | Status: AC

## 2022-05-27 DIAGNOSIS — E109 Type 1 diabetes mellitus without complications: Secondary | ICD-10-CM | POA: Diagnosis not present

## 2022-05-27 DIAGNOSIS — I739 Peripheral vascular disease, unspecified: Secondary | ICD-10-CM | POA: Diagnosis not present

## 2022-05-27 DIAGNOSIS — E1051 Type 1 diabetes mellitus with diabetic peripheral angiopathy without gangrene: Secondary | ICD-10-CM | POA: Diagnosis not present

## 2022-06-03 ENCOUNTER — Telehealth: Payer: Self-pay | Admitting: Neurology

## 2022-06-03 NOTE — Telephone Encounter (Signed)
Forms faxed to DMV (fax# [phone]) 

## 2022-06-10 NOTE — Telephone Encounter (Signed)
I was completing DMV paperwork, patient's last visit was in October 2023, he has an appointment in May 2024.  Can this paperwork wait to be completed until after his appointment to provide more accurate information?  I tried to contact the mobile number listed as primary and his home number without success.  Please try to reach him or his niece to determine if we can wait for this paperwork.  At her last visit I had recommended an evaluation by an occupational therapist of his driving.  I will reflect this recommendation in his paperwork.

## 2022-06-11 NOTE — Telephone Encounter (Signed)
Patient had OT driving evaluation [id number].  It was recommended he stop driving.  This recommendation was based upon his dementia diagnosis, plans to move to an unfamiliar environment, performance in the clinical and behind the wheel evaluation.  Felt it was no longer safe for Mr. Bellman to continue to drive.  He seemed to not be receptive to this recommendation.  Recommended he see his optometrist for updated eye exam.

## 2022-06-19 ENCOUNTER — Other Ambulatory Visit: Payer: Self-pay | Admitting: Neurology

## 2022-07-04 ENCOUNTER — Other Ambulatory Visit: Payer: Self-pay | Admitting: Internal Medicine

## 2022-07-04 DIAGNOSIS — E1159 Type 2 diabetes mellitus with other circulatory complications: Secondary | ICD-10-CM

## 2022-07-21 ENCOUNTER — Ambulatory Visit: Payer: Medicare HMO | Admitting: Neurology

## 2023-01-06 ENCOUNTER — Other Ambulatory Visit: Payer: Self-pay | Admitting: Internal Medicine

## 2023-01-06 DIAGNOSIS — E1165 Type 2 diabetes mellitus with hyperglycemia: Secondary | ICD-10-CM

## 2023-06-17 ENCOUNTER — Other Ambulatory Visit: Payer: Self-pay
